# Patient Record
Sex: Female | Born: 1980 | Race: Black or African American | Hispanic: No | Marital: Married | Smoking: Never smoker
Health system: Southern US, Community
[De-identification: ages and names within clinical notes are randomized; demographics above are authoritative.]

## PROBLEM LIST (undated history)

## (undated) DIAGNOSIS — I341 Nonrheumatic mitral (valve) prolapse: Secondary | ICD-10-CM

## (undated) DIAGNOSIS — N921 Excessive and frequent menstruation with irregular cycle: Secondary | ICD-10-CM

## (undated) DIAGNOSIS — D649 Anemia, unspecified: Secondary | ICD-10-CM

## (undated) DIAGNOSIS — N946 Dysmenorrhea, unspecified: Secondary | ICD-10-CM

## (undated) DIAGNOSIS — I10 Essential (primary) hypertension: Secondary | ICD-10-CM

## (undated) DIAGNOSIS — K219 Gastro-esophageal reflux disease without esophagitis: Secondary | ICD-10-CM

## (undated) DIAGNOSIS — M1990 Unspecified osteoarthritis, unspecified site: Secondary | ICD-10-CM

## (undated) DIAGNOSIS — R51 Headache: Secondary | ICD-10-CM

## (undated) DIAGNOSIS — E119 Type 2 diabetes mellitus without complications: Secondary | ICD-10-CM

## (undated) DIAGNOSIS — R519 Headache, unspecified: Secondary | ICD-10-CM

## (undated) HISTORY — DX: Type 2 diabetes mellitus without complications: E11.9

## (undated) HISTORY — DX: Headache: R51

## (undated) HISTORY — DX: Nonrheumatic mitral (valve) prolapse: I34.1

## (undated) HISTORY — DX: Headache, unspecified: R51.9

## (undated) HISTORY — DX: Essential (primary) hypertension: I10

## (undated) HISTORY — PX: TUBAL LIGATION: SHX77

---

## 2000-08-19 ENCOUNTER — Other Ambulatory Visit: Admission: RE | Admit: 2000-08-19 | Discharge: 2000-08-19 | Payer: Self-pay | Admitting: Obstetrics & Gynecology

## 2001-05-10 ENCOUNTER — Other Ambulatory Visit: Admission: RE | Admit: 2001-05-10 | Discharge: 2001-05-10 | Payer: Self-pay | Admitting: Obstetrics and Gynecology

## 2001-07-05 ENCOUNTER — Ambulatory Visit (HOSPITAL_COMMUNITY): Admission: RE | Admit: 2001-07-05 | Discharge: 2001-07-05 | Payer: Self-pay | Admitting: Obstetrics and Gynecology

## 2001-07-05 ENCOUNTER — Encounter: Payer: Self-pay | Admitting: Obstetrics and Gynecology

## 2001-10-17 ENCOUNTER — Inpatient Hospital Stay (HOSPITAL_COMMUNITY): Admission: AD | Admit: 2001-10-17 | Discharge: 2001-10-20 | Payer: Self-pay | Admitting: Obstetrics and Gynecology

## 2002-01-04 ENCOUNTER — Encounter: Payer: Self-pay | Admitting: Emergency Medicine

## 2002-01-04 ENCOUNTER — Emergency Department (HOSPITAL_COMMUNITY): Admission: EM | Admit: 2002-01-04 | Discharge: 2002-01-04 | Payer: Self-pay | Admitting: Emergency Medicine

## 2002-10-20 ENCOUNTER — Other Ambulatory Visit: Admission: RE | Admit: 2002-10-20 | Discharge: 2002-10-20 | Payer: Self-pay | Admitting: Obstetrics and Gynecology

## 2003-03-26 ENCOUNTER — Inpatient Hospital Stay (HOSPITAL_COMMUNITY): Admission: AD | Admit: 2003-03-26 | Discharge: 2003-03-29 | Payer: Self-pay | Admitting: Obstetrics and Gynecology

## 2003-03-26 ENCOUNTER — Encounter (INDEPENDENT_AMBULATORY_CARE_PROVIDER_SITE_OTHER): Payer: Self-pay | Admitting: Specialist

## 2003-08-07 ENCOUNTER — Emergency Department (HOSPITAL_COMMUNITY): Admission: AD | Admit: 2003-08-07 | Discharge: 2003-08-07 | Payer: Self-pay | Admitting: Family Medicine

## 2004-09-06 ENCOUNTER — Emergency Department (HOSPITAL_COMMUNITY): Admission: EM | Admit: 2004-09-06 | Discharge: 2004-09-06 | Payer: Self-pay | Admitting: Family Medicine

## 2004-09-08 ENCOUNTER — Emergency Department (HOSPITAL_COMMUNITY): Admission: EM | Admit: 2004-09-08 | Discharge: 2004-09-08 | Payer: Self-pay | Admitting: Family Medicine

## 2004-10-15 ENCOUNTER — Other Ambulatory Visit: Admission: RE | Admit: 2004-10-15 | Discharge: 2004-10-15 | Payer: Self-pay | Admitting: Obstetrics and Gynecology

## 2005-02-28 ENCOUNTER — Emergency Department (HOSPITAL_COMMUNITY): Admission: EM | Admit: 2005-02-28 | Discharge: 2005-02-28 | Payer: Self-pay | Admitting: Family Medicine

## 2006-02-21 IMAGING — CR DG WRIST COMPLETE 3+V*L*
4 series · 4 of 4 positions shown · non-contrast
Comparison: none

CLINICAL DATA: Left wrist pain following a fall today.

LEFT WRIST - 4 VIEW

[view not recorded (1 of 4)]
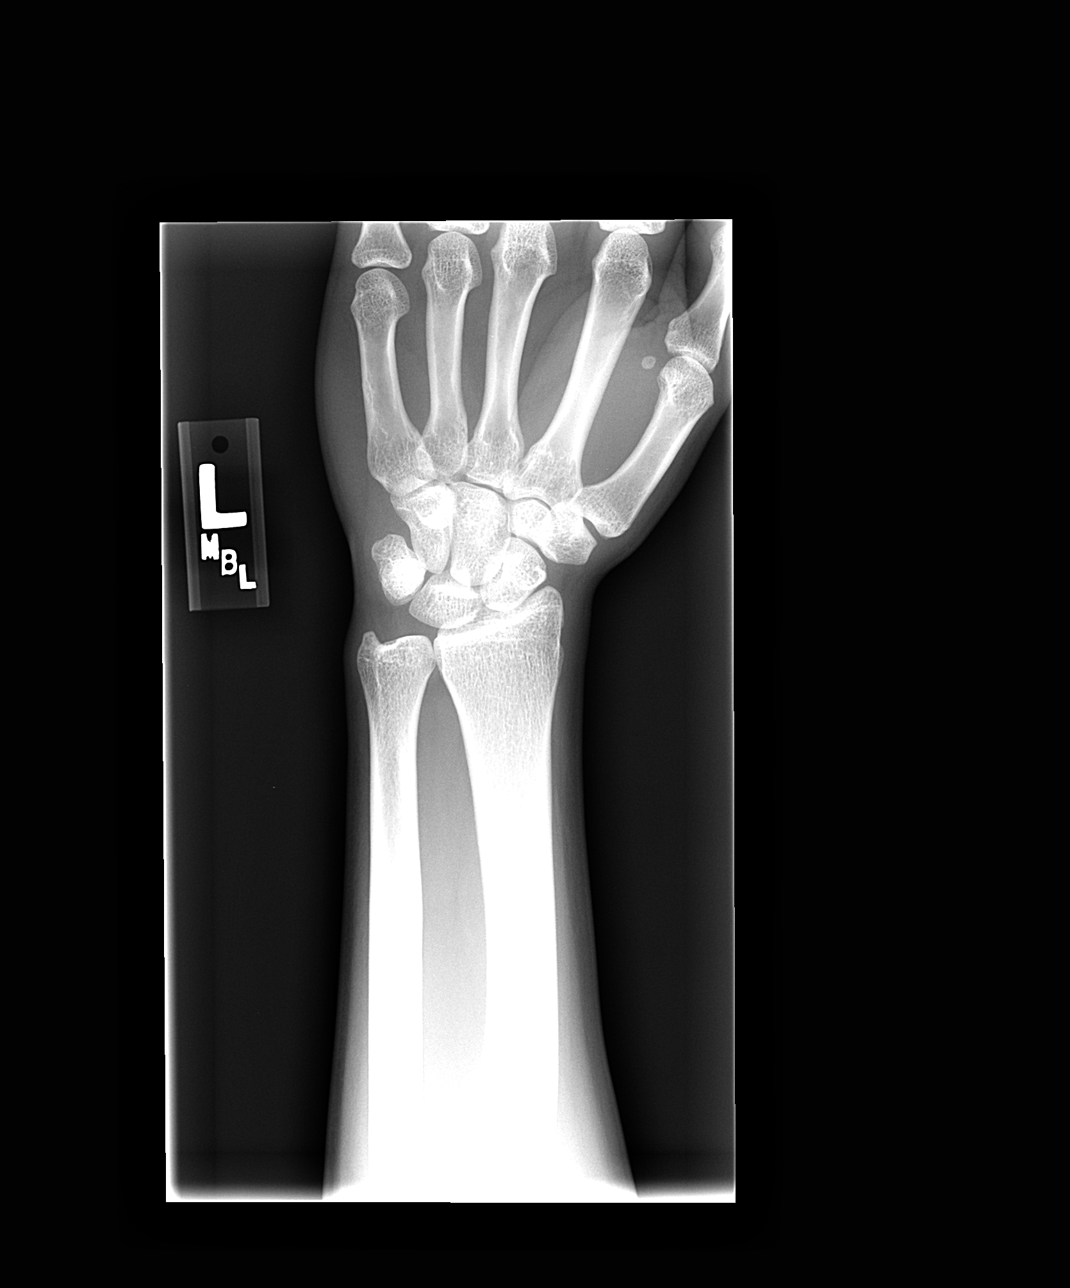

[view not recorded (2 of 4)]
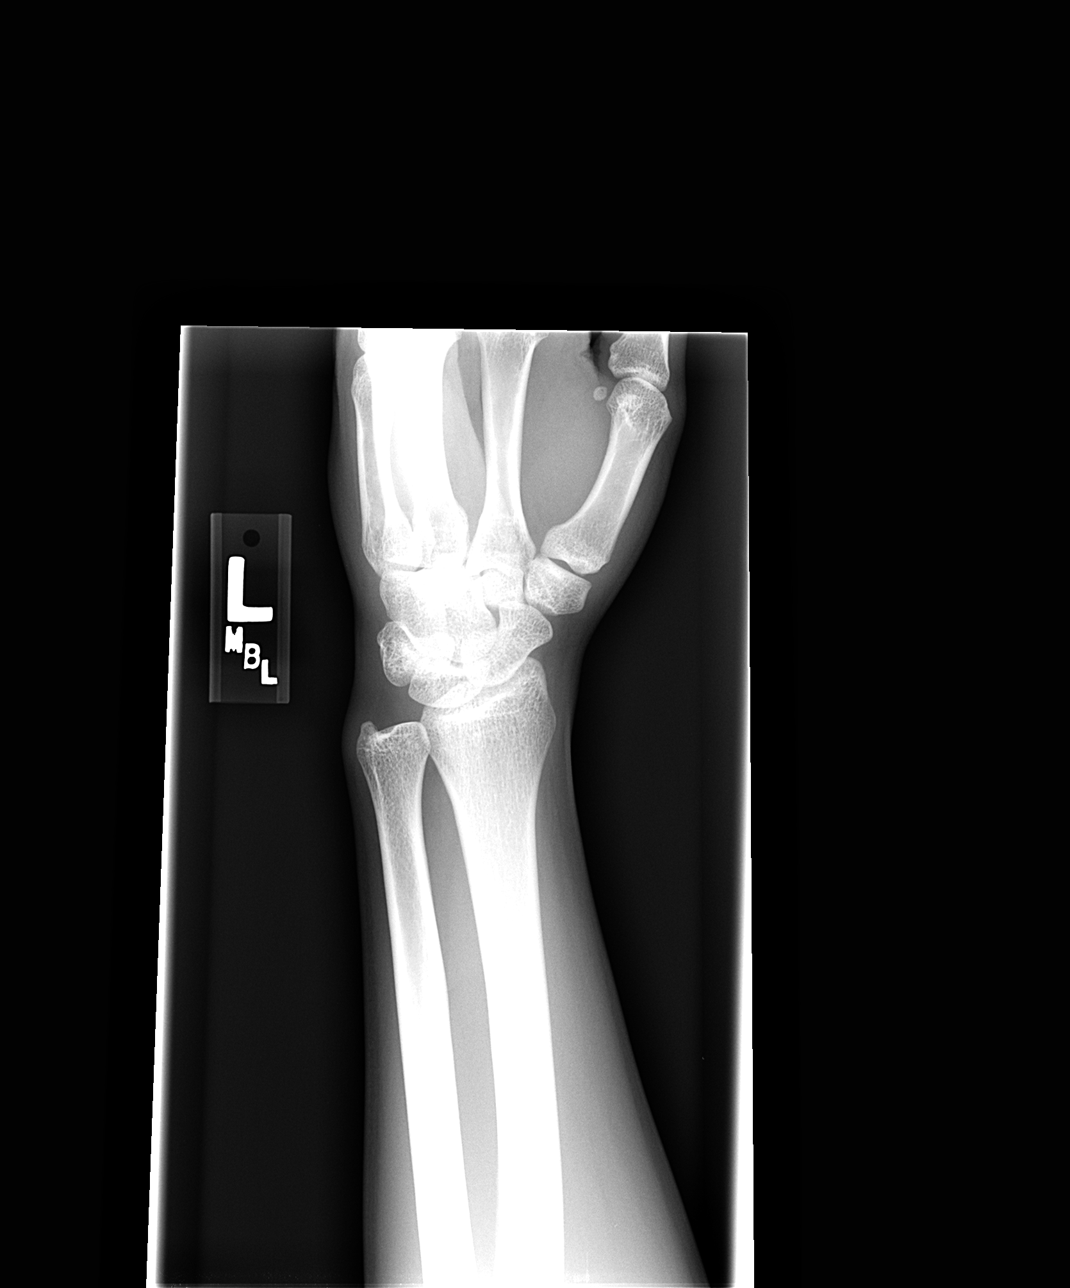

[view not recorded (3 of 4)]
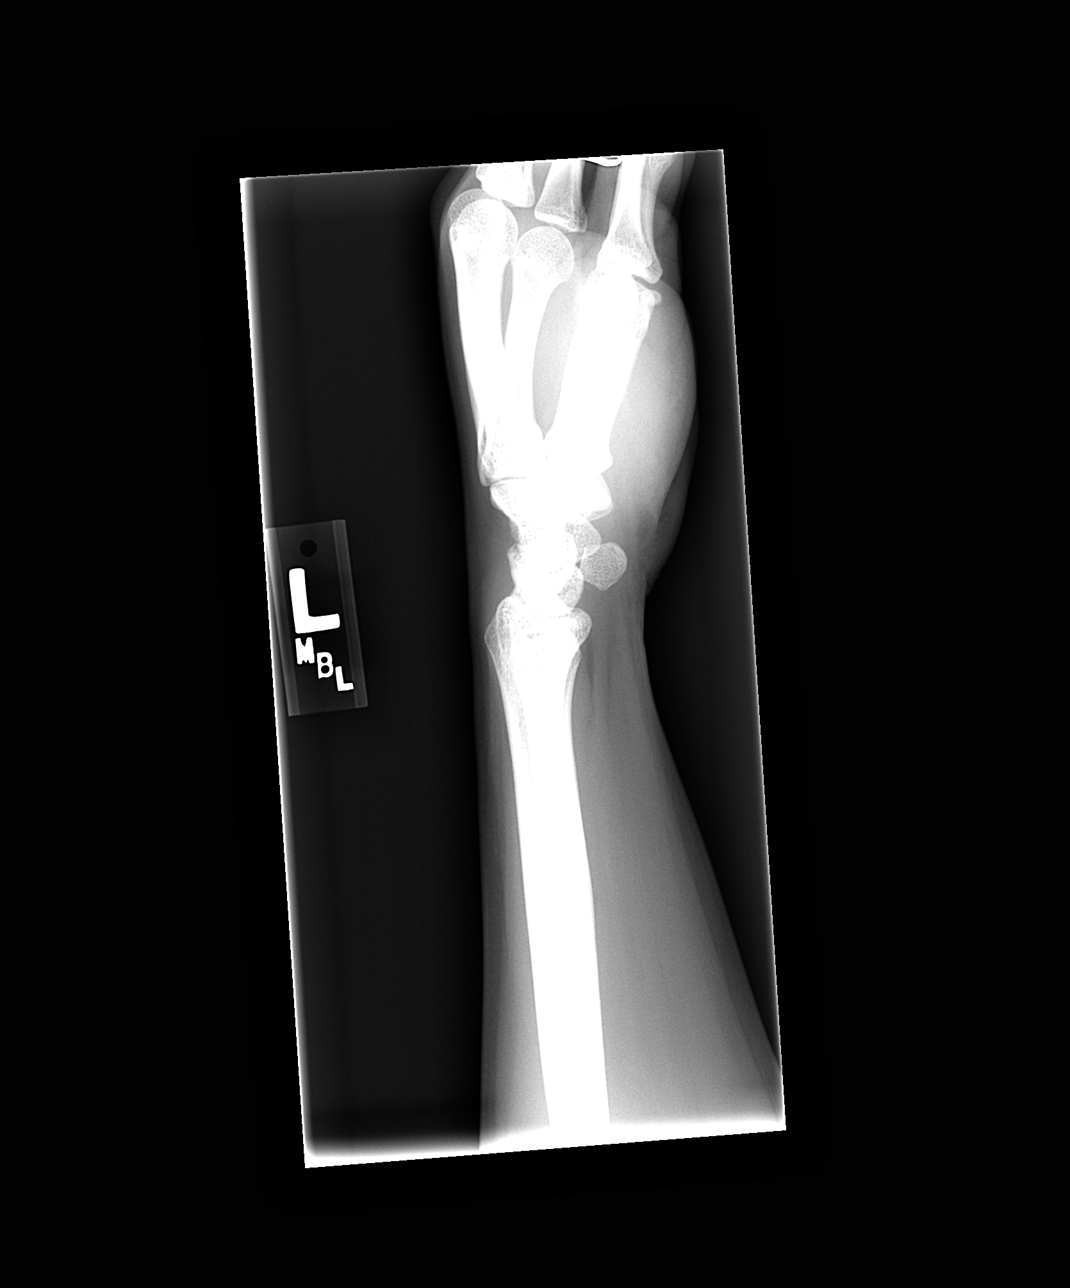

[view not recorded (4 of 4)]
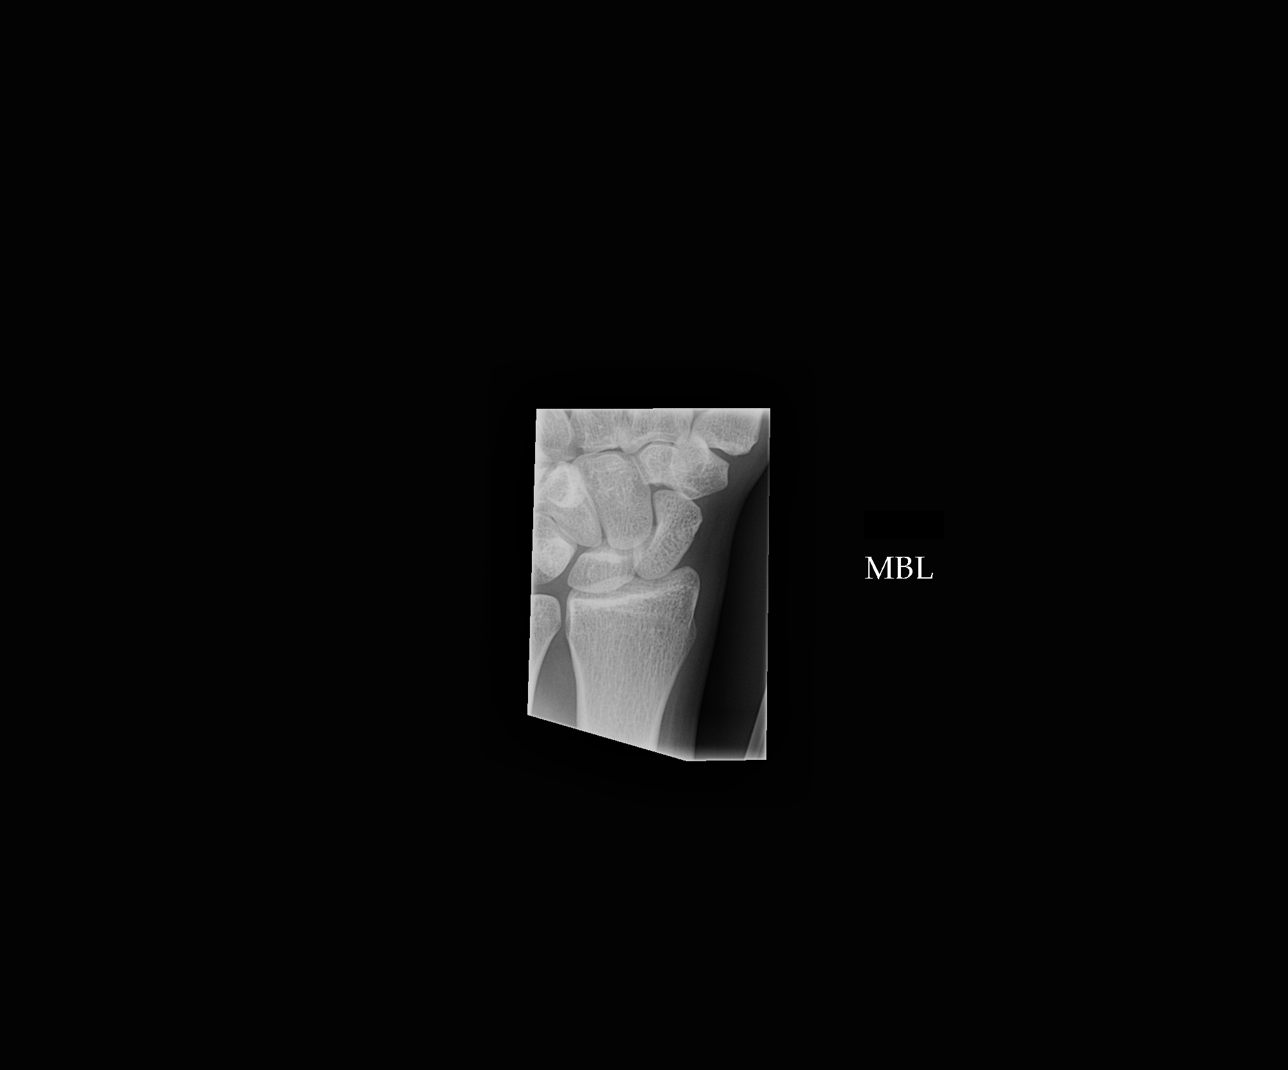

[4 of 4 positions shown; findings below may reference images not displayed]

FINDINGS: Normal appearing bones and soft tissues without fracture or
dislocation.

IMPRESSION

Normal examination.

## 2007-01-01 ENCOUNTER — Emergency Department (HOSPITAL_COMMUNITY): Admission: EM | Admit: 2007-01-01 | Discharge: 2007-01-01 | Payer: Self-pay | Admitting: Family Medicine

## 2007-05-05 ENCOUNTER — Emergency Department (HOSPITAL_COMMUNITY): Admission: EM | Admit: 2007-05-05 | Discharge: 2007-05-05 | Payer: Self-pay | Admitting: Family Medicine

## 2009-06-17 ENCOUNTER — Emergency Department (HOSPITAL_COMMUNITY): Admission: EM | Admit: 2009-06-17 | Discharge: 2009-06-17 | Payer: Self-pay | Admitting: Emergency Medicine

## 2009-11-02 ENCOUNTER — Emergency Department (HOSPITAL_COMMUNITY): Admission: EM | Admit: 2009-11-02 | Discharge: 2009-11-02 | Payer: Self-pay | Admitting: Family Medicine

## 2010-01-21 ENCOUNTER — Emergency Department (HOSPITAL_COMMUNITY): Admission: EM | Admit: 2010-01-21 | Discharge: 2010-01-21 | Payer: Self-pay | Admitting: Emergency Medicine

## 2010-03-28 ENCOUNTER — Emergency Department (HOSPITAL_COMMUNITY): Admission: EM | Admit: 2010-03-28 | Discharge: 2010-03-28 | Payer: Self-pay | Admitting: Family Medicine

## 2010-05-20 ENCOUNTER — Emergency Department (HOSPITAL_COMMUNITY): Admission: EM | Admit: 2010-05-20 | Discharge: 2010-05-20 | Payer: Self-pay | Admitting: Family Medicine

## 2010-12-07 ENCOUNTER — Inpatient Hospital Stay (INDEPENDENT_AMBULATORY_CARE_PROVIDER_SITE_OTHER)
Admission: RE | Admit: 2010-12-07 | Discharge: 2010-12-07 | Disposition: A | Discharge status: Home / Self Care | Payer: Medicaid Other | Source: Ambulatory Visit | Attending: Emergency Medicine | Admitting: Emergency Medicine

## 2010-12-07 DIAGNOSIS — M549 Dorsalgia, unspecified: Secondary | ICD-10-CM

## 2010-12-07 DIAGNOSIS — IMO0001 Reserved for inherently not codable concepts without codable children: Secondary | ICD-10-CM

## 2010-12-13 NOTE — Op Note (Signed)
NAME:  Diane Wilson, Diane Wilson                          ACCOUNT NO.:  [id number]   MEDICAL RECORD NO.:  [id number]                   PATIENT TYPE:  INP   LOCATION:  9104                                 FACILITY:  [facility]   PHYSICIAN:  Naima A. Dillard, M.D.              DATE OF BIRTH:  1980/08/01   DATE OF PROCEDURE:  03/26/2003  DATE OF DISCHARGE:                                 OPERATIVE REPORT   PREOPERATIVE DIAGNOSES:  1. Pregnancy at 37 weeks with labor.  2. Desires repeat cesarean section and bilateral tubal ligation.   POSTOPERATIVE DIAGNOSES:  1. Pregnancy at 37 weeks with labor.  2. Desires repeat cesarean section and bilateral tubal ligation.   PROCEDURES:  1. Repeat cesarean section.  2. Bilateral tubal ligation.   ANESTHESIA:  Spinal.   ESTIMATED BLOOD LOSS:  800 mL.   FLUIDS REPLACED:  2800 mL crystalloid.   URINE OUTPUT:  150 mL clear urine.   COMPLICATIONS:  None.   SURGEON:  Naima A. Dillard, M.D.   ASSISTANT:  Rica Koyanagi, C.N.M.   FINDINGS:  A female infant in vertex presentation with Apgars of 8 and 9,  weight 7 pounds.  Clear fluid noted, no nuchal cord, no meconium.  A normal-  appearing uterus and normal-appearing tubes and ovaries bilaterally.   PROCEDURE IN DETAIL:  The patient was taken to the operating room and placed  in dorsal supine position, prepped and draped in a normal sterile fashion.  Before taking to the operating room, consent was obtained for cesarean  section and tubal ligation.  The patient understands the risks of cesarean  section to be but not limited to bleeding, infection, damage to internal  organs such as bowel and bladder.  The patient was offered VBAC and declined  during her prenatal care.  The patient also desired a tubal ligation.  All  birth control was reviewed.  She understands it is meant to be a permanent  procedure.  There is a failure rate of about one to 200 or one to 300 and  50% of those can be ectopic  pregnancies, so the patient should call in any  event that she thinks she should be pregnant.  After her spinal anesthesia  was found to be adequate, a Pfannenstiel skin incision was made along the  previous incision and carried down to the fascia using Bovie cautery.  The  fascia was incised bilaterally using Bovie cautery.  Kochers x2 were placed  in the superior aspect of the fascia, which was dissected off the rectus  muscles both bluntly and with Bovie cautery.  The inferior aspect of the  fascia was dissected out in a similar fashion.  The rectus muscles were  separated in the midline and the peritoneum was identified, tented up, and  entered sharply with Metzenbaum scissors.  The incision was extended  superiorly and inferiorly with good visualization of bowel  and bladder.  The  bladder blade was inserted.  The vesicouterine peritoneum was identified,  tented up, and entered sharply and made bilaterally.  The bladder blade was  reinserted.  A lower transverse uterine incision was made with a scalpel and  extended bilaterally using bandage scissors.  We had difficulty in  delivering the infant's head.  The vacuum was placed on the infant's head in  correct position and placed in the green zone.  The vacuum started to move  around the infant's head so it was difficult to pull.  We then placed it in  correct position again and suction to the green zone.  There was one pull,  no pop-offs.  The vacuum was removed once the head was delivered.  Mouth and  nares were bulb-suctioned.  No nuchal cord.  The body was delivered without  difficulty.  The cord was clamped and cut and handed to the waiting  pediatricians.  The placenta was manually delivered intact.  The uterus was  cleared of all clot and debris.  The uterine incision was repaired with 0  Vicryl with a running locked fashion.  Any bleeding areas were made  hemostatic with figure-of-eight stitches of 0 Vicryl.  The patient's right   fallopian tube was identified with Babcock clamped, the fimbriated end was  seen.  About a centimeter of the midisthmic portion of the tube was suture  ligated and ligated also with a free tie of 2-0 plain and excised.  Hemostasis was assured.  The patient's left fallopian tube was identified.  The fimbriae were seen.  About a 1.5-2 cm segment of tube was ligated with 2-  0 plain and excised.  Hemostasis was assured.  The patient was closed with 0  chromic after irrigation and hemostasis was assured.  The fascia was closed  with 0 Vicryl in a running locked fashion.  The subcutaneous tissue was made  hemostatic with Bovie cautery after irrigation of the tissue was done.  Sponge, lap, and needle counts were correct x2.  The patient went to the  recovery room in stable condition.                                               Naima A. Normand Sloop, M.D.    NAD/MEDQ  D:  03/26/2003  T:  03/27/2003  Job:  [id number]

## 2010-12-13 NOTE — H&P (Signed)
[facility]  Patient:    Diane Wilson, Diane Wilson Visit Number: [id number] MRN: [id number]          Service Type: Attending:  Maris Berger. Pennie Rushing, M.D. Dictated by:   Mack Guise, C.N.M. Adm. Date:  10/17/01                           History and Physical  HISTORY OF PRESENT ILLNESS:   Diane Wilson is a 30 year old gravida 1 para 0 at term, EDD October 17, 2001 by dates confirmed with pregnancy ultrasonography. She presents to [facility] with increasing signs and symptoms of labor. She is contracting every two to three minutes, does report positive fetal movement, no bleeding, no rupture of membranes.  Denies any headache, visual changes, or epigastric pain.  Her pregnancy has been followed by the M.D. service at [facility] and is remarkable for: 1. Late to care. 2. Equivocal rubella. 3. Negative group B strep.  Her pregnancy was initially evaluated at the office of [facility] on May 10, 2002 at approximately [redacted] weeks gestation.  EDC determined by dates and confirmed with ultrasound.  Prenatal laboratory data reveals hemoglobin and hematocrit 10.7 and 31.9; platelets 266,000.  Blood type and Rh A positive, antibody screen negative. Sickle cell trait negative.  VDRL nonreactive.  Rubella equivocal.  Hepatitis B surface antigen negative.  HIV nonreactive.  Pap smear within normal limits. GC and chlamydia negative.  AFP/free beta HCG within normal range.  One-hour glucose challenge elevated; follow-up three-hour GTT within normal limits.  At 36 weeks culture of the vaginal tract is negative for group B strep.  This patients pregnancy has been essentially unremarkable.  She has been size equal to dates throughout, normotensive, with no proteinuria.  ALLERGIES:                    No known drug allergies.  HABITS:                       Denies the use of tobacco, alcohol, or illicit drugs.  OBSTETRICAL HISTORY:          Present pregnancy.  MEDICAL HISTORY:               Unremarkable.  FAMILY HISTORY:               Maternal aunt with a history of chronic hypertension and two maternal aunts with diabetes.  GENETIC HISTORY:              There is no family history of familial or genetic disorders, children that died in infancy or that were born with birth defects.  SOCIAL HISTORY:               Ms. Dabbs is a 30 year old single African-American.  The father of the baby is Mee Hives.  They do not subscribe to a religious faith.  REVIEW OF SYSTEMS:            There are no signs or symptoms suggestive of focal or systemic disease and the patient is typical of one with a uterine pregnancy at term in early active labor.  PHYSICAL EXAMINATION:  VITAL SIGNS:                  Stable, afebrile.  HEENT:                        Unremarkable.  HEART:                        Regular rate and rhythm.  LUNGS:                        Clear.  ABDOMEN:                      Gravid in its contour.  Uterine fundus is noted to extend 39 cm above the level of the pubic symphysis.  Leopolds maneuvers find the infant to be in a longitudinal lie, cephalic presentation, and the estimated fetal weight is 7.5 pounds.  PELVIC:                       Digital exam of the cervix finds it to be 4 cm dilated, 70% effaced, with the cephalic presenting part at a -2 station. Membranes ruptured on exam, clear fluid.  Patient is contracting every two to three minutes.  The fetal heart rate is reactive and reassuring.  EXTREMITIES:                  Show no pathologic edema.  DTRs are 1+ with no clonus.  ASSESSMENT:                   1. Intrauterine pregnancy at term.                               2. Early active labor.  PLAN:                         1. Admit per Dr. Dierdre Forth.                               2. Routine M.D. orders. Dictated by:   Mack Guise, C.N.M. Attending:  Maris Berger. Pennie Rushing, M.D. DD:  10/17/01 TD:  10/18/01 Job: [id number]

## 2010-12-13 NOTE — Discharge Summary (Signed)
[facility]  Patient:    Diane Wilson, Diane Wilson Visit Number: [id number] MRN: [id number]          Service Type: OBS Location: 910A 9148 01 Attending Physician:  Shaune Spittle Dictated by:   Mack Guise, C.N.M. Admit Date:  10/17/2001 Discharge Date: 10/20/2001                             Discharge Summary  ADMITTING DIAGNOSES:          1. Intrauterine pregnancy at term.                               2. Active labor.  PROCEDURE:                    Primary low transverse cesarean section.  DISCHARGE DIAGNOSIS:          1. Intrauterine pregnancy at term.                               2. Active labor.                               3. Failure to progress.                               4. Status post primary low transverse cesarean                                  section.                               5. Maternal fever.  HOSPITAL COURSE:              Ms. Hartfield is a 30 year old gravida 1, para 0, who presented at term in early active labor.  Her labor progressed normally to 8-9 cm.  She then experienced failure to progress and a maternal temperature of 100.5.  The decision was made to proceed with primary low transverse cesarean section, which was accomplished by Erie Noe P. Haygood, M.D., with the birth of an 8 pound 5 ounce female infant named Carlos with Apgar scores of 9 at one minute and 9 at five minutes.  The patient has done well in the immediate postop period.  Her hemoglobin on the first postop day was 9.7.  Her vital signs have remained stable.  She has remained afebrile and done quite well on this, her third postop day.  She is judged to be in satisfactory condition for discharge.  DISCHARGE INSTRUCTIONS:       Per [facility] handout.  DISCHARGE MEDICATIONS:        1. Motrin 600 mg p.o. q.6h. p.r.n. pain.                               2. Tylox one to two p.o. q.3-4h. p.r.n. pain.  3.  Ortho-Evra patch for contraception.                               4. Prenatal vitamins.  DISCHARGE FOLLOW-UP:          At [facility] in six weeks. Dictated by:   Mack Guise, C.N.M. Attending Physician:  Shaune Spittle DD:  10/20/01 TD:  10/21/01 Job: [id number]

## 2010-12-13 NOTE — H&P (Signed)
NAME:  Diane, Wilson                          ACCOUNT NO.:  [id number]   MEDICAL RECORD NO.:  [id number]                   PATIENT TYPE:  INP   LOCATION:  9198                                 FACILITY:  [facility]   PHYSICIAN:  Naima A. Dillard, M.D.              DATE OF BIRTH:  12-23-1980   DATE OF ADMISSION:  03/26/2003  DATE OF DISCHARGE:                                HISTORY & PHYSICAL   CHIEF COMPLAINT:  Term and latent labor.  Desires repeat cesarean section  and tubal ligation.   HISTORY OF PRESENT ILLNESS:  The patient is a 30 year old, gravida 2, para 1-  0-0-1, with a last menstrual period of July 08, 2002.  The estimated  date of confinement is April 15, 2003, consistent with second trimester  ultrasound.  The estimated gestational age is 37-1/7 weeks.  The patient  presents with complaint of contractions.  No leakage of fluid.  No vaginal  bleeding.  Good fetal movement.  She states that her contractions are every  five to six minutes apart, increasing in intensity and frequency.  The  patient ambulated for one hour after NST was found to be reactive and did  change her cervix from 1 cm dilation to 3 cm dilation.  The patient is  currently consented for repeat cesarean section and tubal ligation.  Prenatal care was with [facility] since approximately [redacted] weeks  gestation.  The pregnancy has been complicated by history of C-section.  The  patient was given information on C-section versus VBAC and has decided for C-  section and tubal ligation.  She also had Trichomonas on Pap smear which was  treated with Flagyl.   PRENATAL LABORATORY DATA:  Hemoglobin 11.  The first platelets were 275.  She is A positive, antibody negative, and sickle cell negative.  RPR was  nonreactive.  Rubella is positive.  Hepatitis B surface antigen was  negative.  HIV is nonreactive.  Pap was within normal limits with  Trichomonas which was treated.  Gonorrhea was negative.  Chlamydia  was  negative.  Cystic fibrosis was negative.  Quad screen was within normal  limits.  Group B Streptococcus was also negative.   PAST OBSTETRICAL HISTORY:  Significant for a full-term primary C-section in  March of 2003.  The baby weighed 8 pounds 5 ounces.  She had a C-section for  failure to progress and chorioamnionitis.  The patient had menarche at age  12, occurring every 28 days, and lasting for six days.  No history of any  sexually transmitted diseases.  No history of abnormal Pap smear.   PAST MEDICAL HISTORY:  Unremarkable.   PAST SURGICAL HISTORY:  Significant for C-section.   SOCIAL HISTORY:  The patient denies any alcohol, drug, or substance abuse.   ALLERGIES:  The patient has no known drug allergies.   FAMILY HISTORY:  Significant for a maternal aunt with chronic  hypertension  and maternal aunt with diabetes.   GENETIC HISTORY:  Unremarkable.   PHYSICAL EXAMINATION:  VITAL SIGNS:  The patient is afebrile.  Vital signs  are stable.  NST is reactive.  The patient is contracting about every three  to five minutes.  HEART:  Regular rate and rhythm.  LUNGS:  Clear to auscultation bilaterally.  ABDOMEN:  Gravid, soft, and nontender.  PELVIC:  The cervix is 3, 50, and -3 per Rica Koyanagi, C.N.M.  EXTREMITIES:  No cyanosis, clubbing, or edema.   ASSESSMENT:  Pregnancy at term in labor.  Desires repeat cesarean section  and tubal ligation.   PLAN:  Will proceed.  The risks of C-section and tubal ligation are, but not  limited to bleeding, infection, damage to internal organs, such as bowel and  bladder, and failure rate about 1:200-1:300 for tubal ligation, 50%  resulting in ectopic pregnancy.  The patient understands agrees with plan.                                               Naima A. Normand Sloop, M.D.    NAD/MEDQ  D:  03/26/2003  T:  03/26/2003  Job:  [id number]

## 2010-12-13 NOTE — Discharge Summary (Signed)
   Diane Wilson, Diane Wilson                          ACCOUNT NO.:  [id number]   MEDICAL RECORD NO.:  [id number]                   PATIENT TYPE:  INP   LOCATION:  9104                                 FACILITY:  [facility]   PHYSICIAN:  Janine Limbo, M.D.            DATE OF BIRTH:  12-19-1980   DATE OF ADMISSION:  03/26/2003  DATE OF DISCHARGE:  03/29/2003                                 DISCHARGE SUMMARY   ADMITTING DIAGNOSES:  1. Intrauterine pregnancy at term.  2. Early active labor.  3. Desires repeat cesarean section and tubal ligation.   PROCEDURE:  1. Repeat low transverse cesarean section.  2. Bilateral tubal sterilization.   DISCHARGE DIAGNOSES:  1. Intrauterine pregnancy at term.  2. Early active labor.  3. Desires repeat cesarean section and tubal ligation.  4. Repeat low transverse cesarean section.  5. Bilateral tubal sterilization.   HISTORY:  Diane Wilson is a 30 year old gravida 2, para 1-0-0-1 who presented  at 45 and 1/[redacted] weeks gestation in early active labor.  She had planned a  repeat cesarean delivery with this pregnancy and therefore she underwent  repeat cesarean section with bilateral tubal sterilization by Naima A.  Dillard, M.D. with the birth of a 7 pound female infant named Jaden with Apgar  scores of 8 at one minute, 9 at five minutes.  Both mother and infant have  been stable in the postpartum period.  The patient's vital signs have  remained stable throughout.  Her hemoglobin on the first postoperative day  was 8.1 and initially was 9.8.  She has not had any signs or symptoms of  dizziness or syncope.  She will take prenatal vitamins and iron supplement  on discharge.  On this, her third postoperative day she is judged to be in  satisfactory condition for discharge.   DISCHARGE INSTRUCTIONS:  Per [facility] handout.  Her incision is  clean and intact.  Her staples will be removed prior to discharge.   DISCHARGE MEDICATIONS:  1. Motrin 600 mg  p.o. q.6h. p.r.n. pain.  2. Tylox one to two p.o. q.3-4h. pain.  3. Prenatal vitamins.  4. Iron supplement b.i.d.   FOLLOWUP:  The patient will follow up in the office of [facility] in six weeks.    Rica Koyanagi, C.N.M.               Janine Limbo, M.D.   SDM/MEDQ  D:  03/29/2003  T:  03/29/2003  Job:  [id number]

## 2010-12-13 NOTE — Op Note (Signed)
[facility]  Patient:    Diane Wilson, FRIBERG Visit Number: [id number] MRN: [id number]          Service Type: OBS Location: 910A 9148 01 Attending Physician:  Shaune Spittle Dictated by:   Maris Berger. Pennie Rushing, M.D. Proc. Date: 10/17/01 Admit Date:  10/17/2001                             Operative Report  PREOPERATIVE DIAGNOSES:       1. Intrauterine pregnancy at term.                               2. Failure to progress in labor.                               3. Maternal fever.  POSTOPERATIVE DIAGNOSES:      1. Intrauterine pregnancy at term.                               2. Failure to progress in labor.                               3. Maternal fever.  OPERATION:                    Primary low transverse cesarean section.  SURGEON:                      Vanessa P. Pennie Rushing, M.D.  FIRST ASSISTANT:              Mack Guise, C.N.M.  ANESTHESIA:                   Epidural.  ESTIMATED BLOOD LOSS:         Less than 750 cc.  COMPLICATIONS:                None.  FINDINGS:                     The patient delivered a female infant whose name is Mikle Bosworth, weighing 8 lb 5 oz, with Apgars of 9 and 9 at one and five minutes respectively.  The uterus, tubes and ovaries were normal for the gravid state.   DESCRIPTION OF PROCEDURE:     The patient was taken to the operating room after appropriate identification and after a discussion of the indications for her procedure and the risks involved, which include (but are not limited to) anesthesia, bleeding, infection, and damage to adjacent organs.  She wished to proceed and was taken to the operating room and placed on the operating table, where her labor epidural was topped.  She had a Foley catheter from labor in place.  The abdomen was prepped with multiple layers of Betadine and draped as a sterile field.  After assurance of adequate anesthesia, a transverse incision was made in the abdomen and  the abdomen opened in layers.  The per9itoneum was entered.  A bladder blade was placed.  The uterus was incised approximately 2 cm above the uterovesical fold and the infant delivered with the aid of a Kiwi vacuum extractor from the occiput transverse position.  The nares  and pharynx were suctioned and the cord clamped and cut.  The infant was handed off to the awaiting pediatricians.  The appropriate cord blood was drawn, the placenta noted to have separated from the uterus and was removed from the operating field.  The uterine incision was closed with a running interlocking suture of 0 Vicryl.  An imbricating suture of 0 Vicryl was then placed.  Hemostasis was noted to be adequate and copious irrigation was carried out.  The abdominal peritoneum was closed with a running suture of 2-0 Vicryl.  The rectus muscles were reapproximated in the midline with a figure-of-eight suture of 2-0 Vicryl.  The rectus fascia was closed wit a running suture of 0 Vicryl, then reinforced on either side of midline with figure-of-eight sutures of 0 Vicryl.  The subcutaneous tissue was made hemostatic with Bovie cautery and irrigated.  Skin staples were applied to the skin incision and a sterile dressing was applied.  Hemostasis throughout the procedure was noted to be adequate.  After application of a sterile dressing, the patient was taken from the operating room to the recovery room in satisfactory condition, having tolerated the procedure well with sponge and instrument counts correct.  The infant went to the full term nursery. Dictated by:   Maris Berger. Pennie Rushing, M.D. Attending Physician:  Shaune Spittle DD:  10/17/01 TD:  10/18/01 Job: [id number]

## 2011-02-22 ENCOUNTER — Inpatient Hospital Stay (INDEPENDENT_AMBULATORY_CARE_PROVIDER_SITE_OTHER)
Admission: RE | Admit: 2011-02-22 | Discharge: 2011-02-22 | Disposition: A | Discharge status: Home / Self Care | Payer: Medicaid Other | Source: Ambulatory Visit | Attending: Family Medicine | Admitting: Family Medicine

## 2011-02-22 DIAGNOSIS — R51 Headache: Secondary | ICD-10-CM

## 2011-02-22 DIAGNOSIS — J019 Acute sinusitis, unspecified: Secondary | ICD-10-CM

## 2011-11-03 ENCOUNTER — Emergency Department (INDEPENDENT_AMBULATORY_CARE_PROVIDER_SITE_OTHER)
Admission: EM | Admit: 2011-11-03 | Discharge: 2011-11-03 | Disposition: A | Discharge status: Home / Self Care | Payer: BC Managed Care – PPO | Source: Home / Self Care | Attending: Family Medicine | Admitting: Family Medicine

## 2011-11-03 ENCOUNTER — Encounter (HOSPITAL_COMMUNITY): Payer: Self-pay

## 2011-11-03 DIAGNOSIS — J329 Chronic sinusitis, unspecified: Secondary | ICD-10-CM

## 2011-11-03 DIAGNOSIS — N390 Urinary tract infection, site not specified: Secondary | ICD-10-CM

## 2011-11-03 LAB — POCT PREGNANCY, URINE: Preg Test, Ur: NEGATIVE

## 2011-11-03 MED ORDER — FLUCONAZOLE 150 MG PO TABS: 150.0000 mg | ORAL_TABLET | Freq: Once | ORAL | Status: AC

## 2011-11-03 MED ORDER — SULFAMETHOXAZOLE-TRIMETHOPRIM 800-160 MG PO TABS: 1.0000 | ORAL_TABLET | Freq: Two times a day (BID) | ORAL | Status: AC

## 2011-11-03 NOTE — Discharge Instructions (Signed)
Take antibiotics as directed. Increase hydration. Return to care should your symptoms not improve, or worsen in any way.

## 2011-11-03 NOTE — ED Provider Notes (Signed)
History     CSN: [id number]  Arrival date & time 11/03/11  Rickey Primus   First MD Initiated Contact with Patient 11/03/11 1823      Chief Complaint  Patient presents with  . Urinary Tract Infection  . Facial Pain    (Consider location/radiation/quality/duration/timing/severity/associated sxs/prior treatment) HPI Comments: Meggen presents for evaluation of pain behind her left eye, headaches, purulent nasal drainage, and rhinorrhea over the last few weeks. She also reports symptoms consistent with urinary tract infection whereby she reports terminal dysuria, urinary urgency, and small amounts of urine. She denies any new sexual partners or exposures.  Patient is a 31 y.o. female presenting with dysuria. The history is provided by the patient.  Dysuria  This is a new problem. The current episode started more than 1 week ago. The problem occurs every urination. The problem has not changed since onset.The quality of the pain is described as burning. There has been no fever. Associated symptoms include frequency and urgency. Pertinent negatives include no chills, no sweats, no nausea, no vomiting, no discharge and no flank pain. She has tried nothing for the symptoms.    History reviewed. No pertinent past medical history.  History reviewed. No pertinent past surgical history.  No family history on file.  History  Substance Use Topics  . Smoking status: Never Smoker   . Smokeless tobacco: Not on file  . Alcohol Use: No    OB History    Grav Para Term Preterm Abortions TAB SAB Ect Mult Living                  Review of Systems  Constitutional: Negative.  Negative for chills.  HENT: Negative.   Eyes: Negative.   Respiratory: Negative.   Cardiovascular: Negative.   Gastrointestinal: Negative.  Negative for nausea and vomiting.  Genitourinary: Positive for dysuria, urgency and frequency. Negative for flank pain.  Musculoskeletal: Negative.   Skin: Negative.   Neurological: Negative.      Allergies  Review of patient's allergies indicates no known allergies.  Home Medications   Current Outpatient Rx  Name Route Sig Dispense Refill  . FLUCONAZOLE 150 MG PO TABS Oral Take 1 tablet (150 mg total) by mouth once. Take one pill once. May repeat if symptoms persist after 3rd day. 2 tablet 0  . SULFAMETHOXAZOLE-TRIMETHOPRIM 800-160 MG PO TABS Oral Take 1 tablet by mouth 2 (two) times daily. 14 tablet 0    BP 141/74  Pulse 75  Temp(Src) 99.5 F (37.5 C) (Oral)  Resp 16  SpO2 100%  LMP 10/23/2011  Physical Exam  Nursing note and vitals reviewed. Constitutional: She is oriented to person, place, and time. She appears well-developed and well-nourished.  HENT:  Head: Normocephalic and atraumatic.  Right Ear: Tympanic membrane is retracted.  Left Ear: Tympanic membrane is retracted.  Mouth/Throat: Uvula is midline, oropharynx is clear and moist and mucous membranes are normal.  Eyes: EOM are normal.  Neck: Normal range of motion.  Cardiovascular: Normal rate, regular rhythm, S1 normal, S2 normal and normal heart sounds.   No murmur heard. Pulmonary/Chest: Effort normal and breath sounds normal. She has no decreased breath sounds. She has no wheezes. She has no rhonchi.  Musculoskeletal: Normal range of motion.  Neurological: She is alert and oriented to person, place, and time.  Skin: Skin is warm and dry.  Psychiatric: Her behavior is normal.    ED Course  Procedures (including critical care time)   Labs Reviewed  POCT PREGNANCY, URINE  No results found.   1. UTI (lower urinary tract infection)   2. Rhinosinusitis       MDM  rx given for TMP-SMX, fluconazole; labs reviewed        Renaee Munda, MD 11/03/11 2243

## 2011-11-03 NOTE — ED Notes (Signed)
C/o pain over lt eye and pressure, tan nasal secretions and runny nose for 2 weeks.  Also c/o urinary urgency, voiding small amounts and rt pelvic pressure for 1 week.

## 2011-12-17 ENCOUNTER — Encounter: Payer: Self-pay | Admitting: *Deleted

## 2011-12-17 ENCOUNTER — Encounter: Payer: Self-pay | Admitting: Cardiology

## 2011-12-18 ENCOUNTER — Encounter: Payer: Self-pay | Admitting: Cardiology

## 2011-12-18 ENCOUNTER — Ambulatory Visit (INDEPENDENT_AMBULATORY_CARE_PROVIDER_SITE_OTHER): Payer: BC Managed Care – PPO | Admitting: Cardiology

## 2011-12-18 ENCOUNTER — Encounter: Payer: Self-pay | Admitting: *Deleted

## 2011-12-18 VITALS — BP 118/74 | HR 75 | Ht 61.0 in | Wt 171.0 lb

## 2011-12-18 DIAGNOSIS — I341 Nonrheumatic mitral (valve) prolapse: Secondary | ICD-10-CM

## 2011-12-18 DIAGNOSIS — I059 Rheumatic mitral valve disease, unspecified: Secondary | ICD-10-CM

## 2011-12-18 DIAGNOSIS — R079 Chest pain, unspecified: Secondary | ICD-10-CM | POA: Insufficient documentation

## 2011-12-18 NOTE — Assessment & Plan Note (Signed)
Pain lasts one to 2 seconds and resolve spontaneously. She does not have exertional symptoms. We'll not pursue further cardiac evaluation.

## 2011-12-18 NOTE — Assessment & Plan Note (Signed)
Plan echocardiogram to further evaluate. I do not hear a mitral regurgitation murmur on examination.

## 2011-12-18 NOTE — Patient Instructions (Signed)
Your physician recommends that you schedule a follow-up appointment in: AS NEEDED PENDING TEST RESULTS  Your physician has requested that you have an echocardiogram. Echocardiography is a painless test that uses sound waves to create images of your heart. It provides your doctor with information about the size and shape of your heart and how well your heart's chambers and valves are working. This procedure takes approximately one hour. There are no restrictions for this procedure.    

## 2011-12-18 NOTE — Progress Notes (Signed)
  HPI: pleasant 31 year old female for evaluation of chest pain and mitral valve prolapse. Patient carries the diagnosis of mitral valve prolapse from previous evaluation in [place]. She does not have dyspnea on exertion, orthopnea, PND, pedal edema, palpitations or history of syncope. She occasionally feels a brief pain in her chest that lasts 1-2 seconds. They resolve spontaneously. The pain is not exertional. No associated symptoms. Because of the above we were asked to evaluate.  No current outpatient prescriptions on file.    No Known Allergies  Past Medical History  Diagnosis Date  . Mitral valve prolapse     Past Surgical History  Procedure Date  . Tubal ligation   . Cesarean section     History   Social History  . Marital Status: Single    Spouse Name: N/A    Number of Children: 2  . 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 Smoking status: Never Smoker   . Smokeless tobacco: Not on file  . Alcohol Use: No  . Drug Use: No  . Sexually Active:    Other Topics Concern  . Not on file   Social History Narrative  . No narrative on file    Family History  Problem Relation Age of Onset  . Diabetes    . Hypertension      ROS: no fevers or chills, productive cough, hemoptysis, dysphasia, odynophagia, melena, hematochezia, dysuria, hematuria, rash, seizure activity, orthopnea, PND, pedal edema, claudication. Remaining systems are negative.  Physical Exam:   Blood pressure 118/74, pulse 75, height 5\' 1"  (1.549 m), weight 77.565 kg (171 lb).  General:  Well developed/well nourished in NAD Skin warm/dry Patient not depressed No peripheral clubbing Back-normal HEENT-normal/normal eyelids Neck supple/normal carotid upstroke bilaterally; no bruits; no JVD; no thyromegaly chest - CTA/ normal expansion CV - RRR/normal S1 and S2; no murmurs, rubs or gallops;  PMI nondisplaced Abdomen -NT/ND, no HSM, no mass,  + bowel sounds, no bruit 2+ femoral pulses, no bruits Ext-no edema, chords, 2+ DP Neuro-grossly nonfocal  ECG normal sinus rhythm at a rate of 75. Nonspecific ST changes.

## 2011-12-25 ENCOUNTER — Ambulatory Visit (HOSPITAL_COMMUNITY): Payer: BC Managed Care – PPO

## 2011-12-29 ENCOUNTER — Ambulatory Visit (HOSPITAL_COMMUNITY): Payer: BC Managed Care – PPO | Attending: Cardiology

## 2011-12-29 DIAGNOSIS — R072 Precordial pain: Secondary | ICD-10-CM | POA: Insufficient documentation

## 2011-12-29 DIAGNOSIS — R079 Chest pain, unspecified: Secondary | ICD-10-CM | POA: Insufficient documentation

## 2011-12-29 DIAGNOSIS — I341 Nonrheumatic mitral (valve) prolapse: Secondary | ICD-10-CM

## 2011-12-29 DIAGNOSIS — I059 Rheumatic mitral valve disease, unspecified: Secondary | ICD-10-CM | POA: Insufficient documentation

## 2011-12-29 NOTE — Progress Notes (Signed)
Echocardiogram performed.  

## 2012-01-01 ENCOUNTER — Telehealth: Payer: Self-pay | Admitting: Cardiology

## 2012-01-01 NOTE — Telephone Encounter (Signed)
Follow-up:    Patient called in returning Christine's call about her ECHO results.  Please call back.

## 2012-01-01 NOTE — Telephone Encounter (Signed)
Patient aware of echo results.

## 2013-02-09 ENCOUNTER — Emergency Department (HOSPITAL_COMMUNITY)
Admission: EM | Admit: 2013-02-09 | Discharge: 2013-02-09 | Disposition: A | Discharge status: Home / Self Care | Payer: BC Managed Care – PPO | Source: Home / Self Care | Attending: Emergency Medicine | Admitting: Emergency Medicine

## 2013-02-09 ENCOUNTER — Encounter (HOSPITAL_COMMUNITY): Payer: Self-pay

## 2013-02-09 DIAGNOSIS — S058X9A Other injuries of unspecified eye and orbit, initial encounter: Secondary | ICD-10-CM

## 2013-02-09 DIAGNOSIS — S0501XA Injury of conjunctiva and corneal abrasion without foreign body, right eye, initial encounter: Secondary | ICD-10-CM

## 2013-02-09 MED ORDER — TETRACAINE HCL 0.5 % OP SOLN
2.0000 [drp] | Freq: Once | OPHTHALMIC | Status: AC
  Administered 2013-02-09: 2 [drp] via OPHTHALMIC

## 2013-02-09 MED ORDER — TETRACAINE HCL 0.5 % OP SOLN
OPHTHALMIC | Status: AC
  Filled 2013-02-09: qty 2

## 2013-02-09 MED ORDER — ERYTHROMYCIN 5 MG/GM OP OINT: TOPICAL_OINTMENT | OPHTHALMIC | Status: AC

## 2013-02-09 NOTE — ED Provider Notes (Signed)
History    CSN: [id number] Arrival date & time 02/09/13  1813  First MD Initiated Contact with Patient 02/09/13 1831     Chief Complaint  Patient presents with  . Eye Problem   (Consider location/radiation/quality/duration/timing/severity/associated sxs/prior Treatment) HPI Comments: Patient presents to urgent care with discomfort and tearing out of his right eye since Friday of last week. She was swimming in a pull and they were fully around by vision water and each other. One point she felt like something hit her eye. In the morning she's been experiencing when she wakes up crusted eyelids. Just some redness or conjunctival. Denies any visual changes, associated symptoms such as nausea, malaise, or headaches.  Tears frequently and with mild discomfort  Patient is a 32 y.o. female presenting with eye problem. The history is provided by the patient.  Eye Problem Location:  R eye Quality:  Aching Severity:  Mild Onset quality:  Sudden Timing:  Constant Progression:  Unchanged Chronicity:  New Relieved by:  Nothing Worsened by:  Nothing tried Associated symptoms: discharge, foreign body sensation, itching, redness and tearing   Associated symptoms: no blurred vision, no double vision, no facial rash, no inflammation, no nausea, no numbness, no photophobia, no vomiting and no weakness   Risk factors: no conjunctival hemorrhage, not exposed to pinkeye, no previous injury to eye, no recent herpes zoster and no recent URI    Past Medical History  Diagnosis Date  . Mitral valve prolapse    Past Surgical History  Procedure Laterality Date  . Tubal ligation    . Cesarean section     Family History  Problem Relation Age of Onset  . Diabetes    . Hypertension     History  Substance Use Topics  . Smoking status: Never Smoker   . Smokeless tobacco: Not on file  . Alcohol Use: No   OB History   Grav Para Term Preterm Abortions TAB SAB Ect Mult Living                 Review  of Systems  Constitutional: Negative for fever, chills, diaphoresis, activity change, fatigue and unexpected weight change.  HENT: Negative for ear pain, neck pain and neck stiffness.   Eyes: Positive for discharge, redness and itching. Negative for blurred vision, double vision, photophobia, pain and visual disturbance.  Respiratory: Negative for shortness of breath.   Gastrointestinal: Negative for nausea and vomiting.  Neurological: Negative for weakness and numbness.    Allergies  Review of patient's allergies indicates no known allergies.  Home Medications   Current Outpatient Rx  Name  Route  Sig  Dispense  Refill  . erythromycin ophthalmic ointment      Place a 1/2 inch ribbon of ointment into the lower eyelid.   1 g   0    BP 135/75  Pulse 73  Temp(Src) 98.4 F (36.9 C) (Oral)  Resp 16  SpO2 100%  LMP 01/17/2013 Physical Exam  Nursing note and vitals reviewed. Constitutional: She appears well-developed and well-nourished. No distress.  HENT:  Head: Normocephalic and atraumatic.  Eyes: Conjunctivae and EOM are normal. Pupils are equal, round, and reactive to light. Right eye exhibits no discharge. Left eye exhibits no chemosis, no discharge, no exudate and no hordeolum. Foreign body present in the left eye. Right conjunctiva is not injected. Left conjunctiva is not injected. No scleral icterus. Right eye exhibits normal extraocular motion and no nystagmus. Left eye exhibits normal extraocular motion and no nystagmus.  Slit lamp exam:      The right eye shows corneal abrasion and fluorescein uptake. The right eye shows no corneal flare, no corneal ulcer, no foreign body, no hyphema and no hypopyon.    Neck: Neck supple.  Neurological: She is alert.  Skin: No rash noted. No erythema.    ED Course  Procedures (including critical care time) Labs Reviewed - No data to display No results found. 1. Corneal abrasion, right, initial encounter     MDM  Linear  conjunctival- and corneal abrasion. Patient has been started on erythromycin ointment. Instructed to followup with the ophthalmologist if no significant improvement in next 48-72 hours. No obvious foramen bodies were seen  Jimmie Molly, MD 02/09/13 [phone]

## 2013-02-09 NOTE — ED Notes (Addendum)
States she has felt a FB sensation in her right eye since Friday last week , feels like a grain of sand in her eye. Wakes w her eye crusted. 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

## 2013-04-25 ENCOUNTER — Ambulatory Visit: Payer: Self-pay | Admitting: Obstetrics

## 2013-04-29 ENCOUNTER — Emergency Department (HOSPITAL_COMMUNITY)
Admission: EM | Admit: 2013-04-29 | Discharge: 2013-04-29 | Disposition: A | Discharge status: Home / Self Care | Payer: BC Managed Care – PPO | Source: Home / Self Care | Attending: Emergency Medicine | Admitting: Emergency Medicine

## 2013-04-29 ENCOUNTER — Encounter (HOSPITAL_COMMUNITY): Payer: Self-pay | Admitting: Emergency Medicine

## 2013-04-29 DIAGNOSIS — J020 Streptococcal pharyngitis: Secondary | ICD-10-CM

## 2013-04-29 MED ORDER — PENICILLIN V POTASSIUM 500 MG PO TABS: 500.0000 mg | ORAL_TABLET | Freq: Three times a day (TID) | ORAL | Status: DC

## 2013-04-29 NOTE — ED Provider Notes (Signed)
Chief Complaint:   Chief Complaint  Patient presents with  . 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 She also has had nasal congestion, rhinorrhea with clear drainage, headache, cough productive yellow sputum, and nausea. She denies any fever, chills, hoarseness, earache, redness of the eyes or drainage, wheezing, chest pain, vomiting, or diarrhea. She's had no known exposure to strep.  Review of Systems:  Other than as noted above, the patient denies any of the following symptoms. Systemic:  No fever, chills, sweats, fatigue, myalgias, headache, or anorexia. Eye:  No redness, pain or drainage. ENT:  No earache, ear congestion, nasal congestion, sneezing, rhinorrhea, sinus pressure, sinus pain, or post nasal drip. Lungs:  No cough, sputum production, wheezing, shortness of breath, or chest pain. GI:  No abdominal pain, nausea, vomiting, or diarrhea. Skin:  No rash or itching.  PMFSH:  Past medical history, family history, social history, meds, allergies, and nurse's notes were reviewed.  There is no known exposure to strep or mono.  No prior history of step or mono.  The patient denies use of tobacco.   Physical Exam:   Vital signs:  BP 121/71  Pulse 92  Temp(Src) 99.6 F (37.6 C) (Oral)  Resp 20  SpO2 99% General:  Alert, in no distress. Eye:  No conjunctival injection or drainage. Lids were normal. ENT:  TMs and canals were normal, without erythema or inflammation.  Nasal mucosa was clear and uncongested, without drainage.  Mucous membranes were moist.  Exam of pharynx reveals tonsils to be enlarged, erythematous, and with spots of white exudate.  There were no oral ulcerations or lesions. Neck:  Supple, no adenopathy, tenderness or mass. Lungs:  No respiratory distress.  Lungs were clear to auscultation, without wheezes, rales or rhonchi.  Breath  sounds were clear and equal bilaterally.  Heart:  Regular rhythm, without gallops, murmers or rubs. Skin:  Clear, warm, and dry, without rash or lesions.  Labs:   Results for orders placed during the hospital encounter of 04/29/13  POCT RAPID STREP A ([facility] ONLY)      Result Value Range   Streptococcus, Group A Screen (Direct) POSITIVE (*) NEGATIVE   Assessment:  The encounter diagnosis was Strep throat.  No evidence of peritonsillar abscess.  Plan:   1.  Meds:  The following meds were prescribed:   New Prescriptions   PENICILLIN V POTASSIUM (VEETID) 500 MG TABLET    Take 1 tablet (500 mg total) by mouth 3 (three) times daily.    2.  Patient Education/Counseling:  The patient was given appropriate handouts, self care instructions, and instructed in symptomatic relief, including hot saline gargles, throat lozenges, infectious precautions, and need to trade out toothbrush.    3.  Follow up:  The patient was told to follow up if no better in 3 to 4 days, if becoming worse in any way, and given some red flag symptoms such as difficulty swallowing or breathing which would prompt immediate return.  Follow up here if necessary.     Reuben Likes, MD 04/29/13 [phone]

## 2013-04-29 NOTE — ED Notes (Signed)
C/o sore throat, onset yesterday.  No fever, no ear pain.  Reports cough with discolored phlegm-yellow phlegm.  No one at home sick, but coworker is sick

## 2013-05-18 ENCOUNTER — Ambulatory Visit: Payer: Self-pay | Admitting: Obstetrics & Gynecology

## 2013-06-28 ENCOUNTER — Ambulatory Visit: Payer: Self-pay | Admitting: Advanced Practice Midwife

## 2013-08-10 ENCOUNTER — Ambulatory Visit: Payer: Self-pay | Admitting: Obstetrics

## 2013-08-15 ENCOUNTER — Ambulatory Visit: Payer: Self-pay | Admitting: Obstetrics & Gynecology

## 2013-09-19 ENCOUNTER — Ambulatory Visit: Payer: BC Managed Care – PPO | Admitting: Obstetrics & Gynecology

## 2013-10-03 ENCOUNTER — Ambulatory Visit (INDEPENDENT_AMBULATORY_CARE_PROVIDER_SITE_OTHER): Payer: BC Managed Care – PPO | Admitting: Obstetrics & Gynecology

## 2013-10-03 ENCOUNTER — Encounter: Payer: Self-pay | Admitting: Obstetrics & Gynecology

## 2013-10-03 VITALS — BP 119/73 | HR 73 | Temp 99.4°F | Ht 61.0 in | Wt 191.0 lb

## 2013-10-03 DIAGNOSIS — Z113 Encounter for screening for infections with a predominantly sexual mode of transmission: Secondary | ICD-10-CM

## 2013-10-03 DIAGNOSIS — Z01419 Encounter for gynecological examination (general) (routine) without abnormal findings: Secondary | ICD-10-CM

## 2013-10-03 DIAGNOSIS — Z124 Encounter for screening for malignant neoplasm of cervix: Secondary | ICD-10-CM

## 2013-10-03 NOTE — Patient Instructions (Signed)

## 2013-10-03 NOTE — Progress Notes (Signed)
Subjective:     Diane Wilson is a 33 y.o. female here for a routine exam.  Current complaints: annual exam.  Personal health questionnaire reviewed: yes.   Gynecologic History Patient's last menstrual period was 09/24/2013. Contraception: tubal ligation Last Pap: 2012  Results were: normal  Obstetric History OB History  Gravida Para Term Preterm AB SAB TAB Ectopic Multiple Living  2 2 2       2     # Outcome Date GA Lbr Len/2nd Weight Sex Delivery Anes PTL Lv  2 TRM 03/26/03 5127w0d  7 lb 8 oz (3.402 kg) M LTCS EPI  Y  1 TRM 10/17/01 6627w0d  8 lb 5 oz (3.771 kg) M LTCS EPI  Y       The following portions of the patient's history were reviewed and updated as appropriate: allergies, current medications, past family history, past medical history, past social history, past surgical history and problem list.  Review of Systems Pertinent items are noted in HPI.   Objective:    BP 119/73  Pulse 73  Temp(Src) 99.4 F (37.4 C)  Ht 5\' 1"  (1.549 m)  Wt 191 lb (86.637 kg)  BMI 36.11 kg/m2  LMP 09/24/2013  General Appearance:    Alert, cooperative, no distress, appears stated age  Breast Exam:    No tenderness, masses, or nipple abnormality  Abdomen:     Soft, non-tender, bowel sounds active all four quadrants,    no masses, no organomegaly  Genitalia:    Normal female without lesion, discharge or tenderness      AV uterus, upper limits of     normal size, adnexa NT Assessment:   Healthy female exam.   Plan:   Pap smear HPV GC &  today HIV, RPR Follow up in 2 years for pap

## 2013-10-04 LAB — PAP IG, CT-NG NAA, HPV HIGH-RISK
Chlamydia Probe Amp: NEGATIVE
GC Probe Amp: NEGATIVE
HPV DNA HIGH RISK: NOT DETECTED

## 2013-10-04 LAB — HIV ANTIBODY (ROUTINE TESTING W REFLEX): HIV: NONREACTIVE

## 2013-10-04 LAB — RPR

## 2013-10-20 ENCOUNTER — Encounter (HOSPITAL_COMMUNITY): Payer: Self-pay | Admitting: Emergency Medicine

## 2013-10-20 ENCOUNTER — Emergency Department (INDEPENDENT_AMBULATORY_CARE_PROVIDER_SITE_OTHER)
Admission: EM | Admit: 2013-10-20 | Discharge: 2013-10-20 | Disposition: A | Discharge status: Home / Self Care | Payer: No Typology Code available for payment source | Source: Home / Self Care

## 2013-10-20 DIAGNOSIS — M79609 Pain in unspecified limb: Secondary | ICD-10-CM

## 2013-10-20 DIAGNOSIS — M79642 Pain in left hand: Secondary | ICD-10-CM

## 2013-10-20 MED ORDER — PREDNISONE 10 MG PO TABS: ORAL_TABLET | ORAL | Status: DC

## 2013-10-20 MED ORDER — ACETAMINOPHEN 160 MG/5ML PO SOLN
15.0000 mg/kg | Freq: Once | ORAL | Status: AC
  Administered 2013-10-20: 20:00:00 via ORAL

## 2013-10-20 NOTE — ED Provider Notes (Signed)
CSN: [id number]     Arrival date & time 10/20/13  1736 History   None    No chief complaint on file.  (Consider location/radiation/quality/duration/timing/severity/associated sxs/prior Treatment) HPI Comments: 33 yo female presents with left hand pain with trying to close fist. She notes difficulty making 3rd and 4th digits flex. She denies any previous injury/ strain or hx of problems with her hand or neck. She notes she has been sleeping on pillow oddly and has mild neck stiffness.    Past Medical History  Diagnosis Date  . Mitral valve prolapse    Past Surgical History  Procedure Laterality Date  . Tubal ligation    . Cesarean section     Family History  Problem Relation Age of Onset  . Diabetes    . Hypertension    . Cancer Father    History  Substance Use Topics  . Smoking status: Never Smoker   . Smokeless tobacco: Not on file  . Alcohol Use: No   OB History   Grav Para Term Preterm Abortions TAB SAB Ect Mult Living   2 2 2       2      Review of Systems  Musculoskeletal: Positive for arthralgias, myalgias and neck pain.  All other systems reviewed and are negative.    Allergies  Review of patient's allergies indicates no known allergies.  Home Medications   Current Outpatient Rx  Name  Route  Sig  Dispense  Refill  . penicillin v potassium (VEETID) 500 MG tablet   Oral   Take 1 tablet (500 mg total) by mouth 3 (three) times daily.   30 tablet   0    LMP 09/24/2013 Physical Exam  Nursing note and vitals reviewed. Constitutional: She is oriented to person, place, and time. She appears well-developed and well-nourished.  HENT:  Head: Normocephalic and atraumatic.  Right Ear: External ear normal.  Left Ear: External ear normal.  Nose: Nose normal.  Mouth/Throat: Oropharynx is clear and moist.  Eyes: Conjunctivae and EOM are normal.  Neck: Normal range of motion.  Cardiovascular: Normal rate, regular rhythm, normal heart sounds and intact distal  pulses.   Pulmonary/Chest: Effort normal and breath sounds normal.  Musculoskeletal: She exhibits tenderness.  Decreased aggressive ROm 3rd/ 4th digits left hand, pain with full  Passive flexion. ? Nodules on tendon mid hand  Lymphadenopathy:    She has no cervical adenopathy.  Neurological: She is alert and oriented to person, place, and time. She has normal reflexes. No cranial nerve deficit. Coordination normal.  Skin: Skin is warm and dry.  Psychiatric: She has a normal mood and affect. Judgment normal.    ED Course  Procedures (including critical care time) Labs Review Labs Reviewed - No data to display Imaging Review No results found.   MDM  Dr Denyse Amassorey assisted in evaluation of patient. ? Trigger finger vs radicular pain- Pred DP 10 mg AD. Double Band aide splint applied with instructions on hygiene/ use of hand, REF Hand Specialist. Advised of better sleep hygiene, f/u PCP if neck symptoms increase.   Berenice PrimasMelissa R Ailyne Pawley, PA-C 10/20/13 2203

## 2013-10-20 NOTE — ED Notes (Signed)
Right ring finger swollen

## 2013-10-21 NOTE — ED Provider Notes (Signed)
Medical screening examination/treatment/procedure(s) were performed by a resident physician or non-physician practitioner and as the supervising physician I was immediately available for consultation/collaboration.  Mekaila Tarnow, MD   [place] Ganoe S Guerin Lashomb, MD 10/21/13 0739 

## 2014-05-29 ENCOUNTER — Encounter (HOSPITAL_COMMUNITY): Payer: Self-pay | Admitting: Emergency Medicine

## 2014-06-14 ENCOUNTER — Encounter (HOSPITAL_COMMUNITY): Payer: Self-pay | Admitting: Emergency Medicine

## 2014-06-14 ENCOUNTER — Emergency Department (HOSPITAL_COMMUNITY)
Admission: EM | Admit: 2014-06-14 | Discharge: 2014-06-14 | Disposition: A | Discharge status: Home / Self Care | Payer: No Typology Code available for payment source | Source: Home / Self Care | Attending: Family Medicine | Admitting: Family Medicine

## 2014-06-14 DIAGNOSIS — M545 Low back pain, unspecified: Secondary | ICD-10-CM

## 2014-06-14 MED ORDER — CYCLOBENZAPRINE HCL 5 MG PO TABS: 5.0000 mg | ORAL_TABLET | Freq: Every evening | ORAL | Status: DC | PRN

## 2014-06-14 MED ORDER — DICLOFENAC SODIUM 50 MG PO TBEC: 50.0000 mg | DELAYED_RELEASE_TABLET | Freq: Two times a day (BID) | ORAL | Status: DC | PRN

## 2014-06-14 NOTE — ED Provider Notes (Signed)
Diane Wilson is a 33 y.o. female who presents to Urgent Care today for Back pain. Patient suffered mild right low back pain this morning well getting out of bed. She was able to go to work and continues to have moderate pain. She has tried ibuprofen which helps. No radiating pain weakness or numbness. No fevers or chills. No urinary symptoms vomiting or diarrhea.   Past Medical History  Diagnosis Date  . Mitral valve prolapse    Past Surgical History  Procedure Laterality Date  . Tubal ligation    . Cesarean section     History  Substance Use Topics  . Smoking status: Never Smoker   . Smokeless tobacco: Not on file  . Alcohol Use: No   ROS as above Medications: No current facility-administered medications for this encounter.   Current Outpatient Prescriptions  Medication Sig Dispense Refill  . cyclobenzaprine (FLEXERIL) 5 MG tablet Take 1 tablet (5 mg total) by mouth at bedtime as needed for muscle spasms. 20 tablet 0  . diclofenac (VOLTAREN) 50 MG EC tablet Take 1 tablet (50 mg total) by mouth 2 (two) times daily as needed. 30 tablet 0  . penicillin v potassium (VEETID) 500 MG tablet Take 1 tablet (500 mg total) by mouth 3 (three) times daily. 30 tablet 0   No Known Allergies   Exam:  BP 117/82 mmHg  Pulse 89  Temp(Src) 98.5 F (36.9 C) (Oral)  Resp 16  SpO2 99%  LMP 05/28/2014 Gen: Well NAD Back: nontender spinal midline. Tender palpation right lumbar paraspinal. Normal back range of motion. Negative straight leg raise test and Faber test bilaterally. Lower extremity reflexes and strength are equal and normal bilaterally. Normal gait.  No results found for this or any previous visit (from the past 24 hour(s)). No results found.  Assessment and Plan: 33 y.o. female with lumbago due to myofascial disruption. Treatment with home exercise program diclofenac and Flexeril.  Discussed warning signs or symptoms. Please see discharge instructions. Patient expresses  understanding.     Rodolph BongEvan S Jshawn Hurta, MD 06/14/14 [phone]

## 2014-06-14 NOTE — Discharge Instructions (Signed)
Thank you for coming in today. °Come back or go to the emergency room if you notice new weakness new numbness problems walking or bowel or bladder problems. ° ° °Back Exercises °These exercises may help you when beginning to rehabilitate your injury. Your symptoms may resolve with or without further involvement from your physician, physical therapist or athletic trainer. While completing these exercises, remember:  °· Restoring tissue flexibility helps normal motion to return to the joints. This allows healthier, less painful movement and activity. °· An effective stretch should be held for at least 30 seconds. °· A stretch should never be painful. You should only feel a gentle lengthening or release in the stretched tissue. °STRETCH - Extension, Prone on Elbows  °· Lie on your stomach on the floor, a bed will be too soft. Place your palms about shoulder width apart and at the height of your head. °· Place your elbows under your shoulders. If this is too painful, stack pillows under your chest. °· Allow your body to relax so that your hips drop lower and make contact more completely with the floor. °· Hold this position for __________ seconds. °· Slowly return to lying flat on the floor. °Repeat __________ times. Complete this exercise __________ times per day.  °RANGE OF MOTION - Extension, Prone Press Ups  °· Lie on your stomach on the floor, a bed will be too soft. Place your palms about shoulder width apart and at the height of your head. °· Keeping your back as relaxed as possible, slowly straighten your elbows while keeping your hips on the floor. You may adjust the placement of your hands to maximize your comfort. As you gain motion, your hands will come more underneath your shoulders. °· Hold this position __________ seconds. °· Slowly return to lying flat on the floor. °Repeat __________ times. Complete this exercise __________ times per day.  °RANGE OF MOTION- Quadruped, Neutral Spine  °· Assume a hands  and knees position on a firm surface. Keep your hands under your shoulders and your knees under your hips. You may place padding under your knees for comfort. °· Drop your head and point your tail bone toward the ground below you. This will round out your low back like an angry cat. Hold this position for __________ seconds. °· Slowly lift your head and release your tail bone so that your back sags into a large arch, like an old horse. °· Hold this position for __________ seconds. °· Repeat this until you feel limber in your low back. °· Now, find your "sweet spot." This will be the most comfortable position somewhere between the two previous positions. This is your neutral spine. Once you have found this position, tense your stomach muscles to support your low back. °· Hold this position for __________ seconds. °Repeat __________ times. Complete this exercise __________ times per day.  °STRETCH - Flexion, Single Knee to Chest  °· Lie on a firm bed or floor with both legs extended in front of you. °· Keeping one leg in contact with the floor, bring your opposite knee to your chest. Hold your leg in place by either grabbing behind your thigh or at your knee. °· Pull until you feel a gentle stretch in your low back. Hold __________ seconds. °· Slowly release your grasp and repeat the exercise with the opposite side. °Repeat __________ times. Complete this exercise __________ times per day.  °STRETCH - Hamstrings, Standing °· Stand or sit and extend your right / left   leg, placing your foot on a chair or foot stool °· Keeping a slight arch in your low back and your hips straight forward. °· Lead with your chest and lean forward at the waist until you feel a gentle stretch in the back of your right / left knee or thigh. (When done correctly, this exercise requires leaning only a small distance.) °· Hold this position for __________ seconds. °Repeat __________ times. Complete this stretch __________ times per  day. °STRENGTHENING - Deep Abdominals, Pelvic Tilt  °· Lie on a firm bed or floor. Keeping your legs in front of you, bend your knees so they are both pointed toward the ceiling and your feet are flat on the floor. °· Tense your lower abdominal muscles to press your low back into the floor. This motion will rotate your pelvis so that your tail bone is scooping upwards rather than pointing at your feet or into the floor. °· With a gentle tension and even breathing, hold this position for __________ seconds. °Repeat __________ times. Complete this exercise __________ times per day.  °STRENGTHENING - Abdominals, Crunches  °· Lie on a firm bed or floor. Keeping your legs in front of you, bend your knees so they are both pointed toward the ceiling and your feet are flat on the floor. Cross your arms over your chest. °· Slightly tip your chin down without bending your neck. °· Tense your abdominals and slowly lift your trunk high enough to just clear your shoulder blades. Lifting higher can put excessive stress on the low back and does not further strengthen your abdominal muscles. °· Control your return to the starting position. °Repeat __________ times. Complete this exercise __________ times per day.  °STRENGTHENING - Quadruped, Opposite UE/LE Lift  °· Assume a hands and knees position on a firm surface. Keep your hands under your shoulders and your knees under your hips. You may place padding under your knees for comfort. °· Find your neutral spine and gently tense your abdominal muscles so that you can maintain this position. Your shoulders and hips should form a rectangle that is parallel with the floor and is not twisted. °· Keeping your trunk steady, lift your right hand no higher than your shoulder and then your left leg no higher than your hip. Make sure you are not holding your breath. Hold this position __________ seconds. °· Continuing to keep your abdominal muscles tense and your back steady, slowly return  to your starting position. Repeat with the opposite arm and leg. °Repeat __________ times. Complete this exercise __________ times per day. °Document Released: 08/01/2005 Document Revised: 10/06/2011 Document Reviewed: 10/26/2008 °[employer]® Patient Information ©2015 [employer]. This information is not intended to replace advice given to you by your health care provider. Make sure you discuss any questions you have with your health care provider. ° °

## 2014-06-14 NOTE — ED Notes (Signed)
Reports she pulled her back muscle onset this am while getting out of bed this am Went to work afterwards Denies urinary sx Alert, no signs of acute distress

## 2014-07-24 ENCOUNTER — Encounter: Payer: Self-pay | Admitting: *Deleted

## 2014-07-25 ENCOUNTER — Encounter: Payer: Self-pay | Admitting: Obstetrics & Gynecology

## 2014-10-05 ENCOUNTER — Ambulatory Visit: Payer: No Typology Code available for payment source | Admitting: Certified Nurse Midwife

## 2014-10-24 ENCOUNTER — Ambulatory Visit: Payer: 59 | Admitting: Certified Nurse Midwife

## 2014-11-17 ENCOUNTER — Ambulatory Visit (INDEPENDENT_AMBULATORY_CARE_PROVIDER_SITE_OTHER): Payer: 59 | Admitting: Certified Nurse Midwife

## 2014-11-17 ENCOUNTER — Encounter: Payer: Self-pay | Admitting: Certified Nurse Midwife

## 2014-11-17 VITALS — BP 131/69 | HR 65 | Temp 98.5°F | Ht 62.0 in | Wt 188.0 lb

## 2014-11-17 DIAGNOSIS — E669 Obesity, unspecified: Secondary | ICD-10-CM

## 2014-11-17 DIAGNOSIS — Z01419 Encounter for gynecological examination (general) (routine) without abnormal findings: Secondary | ICD-10-CM | POA: Diagnosis not present

## 2014-11-17 DIAGNOSIS — N939 Abnormal uterine and vaginal bleeding, unspecified: Secondary | ICD-10-CM

## 2014-11-17 LAB — CBC WITH DIFFERENTIAL/PLATELET
BASOS ABS: 0 10*3/uL (ref 0.0–0.1)
BASOS PCT: 0 % (ref 0–1)
EOS ABS: 0.1 10*3/uL (ref 0.0–0.7)
Eosinophils Relative: 1 % (ref 0–5)
HEMATOCRIT: 36.3 % (ref 36.0–46.0)
HEMOGLOBIN: 12 g/dL (ref 12.0–15.0)
Lymphocytes Relative: 47 % — ABNORMAL HIGH (ref 12–46)
Lymphs Abs: 2.4 10*3/uL (ref 0.7–4.0)
MCH: 27.7 pg (ref 26.0–34.0)
MCHC: 33.1 g/dL (ref 30.0–36.0)
MCV: 83.8 fL (ref 78.0–100.0)
MONO ABS: 0.4 10*3/uL (ref 0.1–1.0)
MONOS PCT: 8 % (ref 3–12)
MPV: 9.5 fL (ref 8.6–12.4)
NEUTROS ABS: 2.3 10*3/uL (ref 1.7–7.7)
Neutrophils Relative %: 44 % (ref 43–77)
PLATELETS: 359 10*3/uL (ref 150–400)
RBC: 4.33 MIL/uL (ref 3.87–5.11)
RDW: 14.1 % (ref 11.5–15.5)
WBC: 5.2 10*3/uL (ref 4.0–10.5)

## 2014-11-17 LAB — COMPREHENSIVE METABOLIC PANEL
ALBUMIN: 4.4 g/dL (ref 3.5–5.2)
ALT: 9 U/L (ref 0–35)
AST: 16 U/L (ref 0–37)
Alkaline Phosphatase: 43 U/L (ref 39–117)
BUN: 7 mg/dL (ref 6–23)
CALCIUM: 9.4 mg/dL (ref 8.4–10.5)
CHLORIDE: 100 meq/L (ref 96–112)
CO2: 28 meq/L (ref 19–32)
Creat: 0.76 mg/dL (ref 0.50–1.10)
GLUCOSE: 86 mg/dL (ref 70–99)
POTASSIUM: 3.9 meq/L (ref 3.5–5.3)
SODIUM: 138 meq/L (ref 135–145)
TOTAL PROTEIN: 7.4 g/dL (ref 6.0–8.3)
Total Bilirubin: 0.3 mg/dL (ref 0.2–1.2)

## 2014-11-17 LAB — HEMOGLOBIN A1C
HEMOGLOBIN A1C: 6 % — AB (ref <5.7)
Mean Plasma Glucose: 126 mg/dL — ABNORMAL HIGH (ref <117)

## 2014-11-17 LAB — HDL CHOLESTEROL: HDL: 51 mg/dL (ref >=46)

## 2014-11-17 LAB — TRIGLYCERIDES: TRIGLYCERIDES: 112 mg/dL (ref <150)

## 2014-11-17 LAB — CHOLESTEROL, TOTAL: Cholesterol: 167 mg/dL (ref 0–200)

## 2014-11-17 MED ORDER — TRANEXAMIC ACID 650 MG PO TABS: 1300.0000 mg | ORAL_TABLET | Freq: Three times a day (TID) | ORAL | Status: DC

## 2014-11-17 MED ORDER — LIRAGLUTIDE -WEIGHT MANAGEMENT 18 MG/3ML ~~LOC~~ SOPN: 1.0000 "application " | PEN_INJECTOR | Freq: Every day | SUBCUTANEOUS | Status: DC

## 2014-11-17 NOTE — Addendum Note (Signed)
Addended by: Henriette CombsHATTON, ANDREA L on: 11/17/2014 03:13 PM   Modules accepted: Orders

## 2014-11-17 NOTE — Progress Notes (Signed)
Patient ID: Diane Wilson, female   DOB: 15-Feb-1981, 34 y.o.   MRN: [id number]   Subjective:     Diane Scarcerina N Farace is a 34 y.o. female here for a routine exam.  Current complaints: none.  Chronic medical condition is mitral valve prolapse, just occasional chest pain.  Denies any other medical conditions.  Only surgical operations C-sections.  Denies any hypertension.   Maternal uncle with CVA at age 753 yrs.   Mother, Aunts & Uncles all have DM.  Regular monthly cycles with heavy bleeding for several days soaking tampons in 30 min.  Has had BTL.  Cycles last 7+ days.  Had Mirena IUD in the past.  Counseled on benefits of new IUD.    Personal health questionnaire:  Is patient Ashkenazi Jewish, have a family history of breast and/or ovarian cancer: no Is there a family history of uterine cancer diagnosed at age < 6350, gastrointestinal cancer, urinary tract cancer, family member who is a Personnel officerLynch syndrome-associated carrier: no Is the patient overweight and hypertensive, family history of diabetes, personal history of gestational diabetes, preeclampsia or PCOS: yes Is patient over 855, have PCOS,  family history of premature CHD under age 36, diabetes, smoke, have hypertension or peripheral artery disease:  yes At any time, has a partner hit, kicked or otherwise hurt or frightened you?: no Over the past 2 weeks, have you felt down, depressed or hopeless?: no Over the past 2 weeks, have you felt little interest or pleasure in doing things?:no   Gynecologic History Patient's last menstrual period was 11/06/2014. Contraception: tubal ligation Last Pap: 10/03/13. Results were: normal Last mammogram: N/A.   Obstetric History OB History  Gravida Para Term Preterm AB SAB TAB Ectopic Multiple Living  2 2 2       2     # Outcome Date GA Lbr Len/2nd Weight Sex Delivery Anes PTL Lv  2 Term 03/26/03 5581w0d  3.402 kg (7 lb 8 oz) M CS-LTranv EPI  Y  1 Term 10/17/01 2981w0d  3.771 kg (8 lb 5 oz) M CS-LTranv EPI  Y       Past Medical History  Diagnosis Date  . Mitral valve prolapse     Past Surgical History  Procedure Laterality Date  . Tubal ligation    . Cesarean section       Current outpatient prescriptions:  .  Liraglutide -Weight Management (SAXENDA) 18 MG/3ML SOPN, Inject 1 application into the skin daily., Disp: 1 pen, Rfl: 4 .  tranexamic acid (LYSTEDA) 650 MG TABS tablet, Take 2 tablets (1,300 mg total) by mouth 3 (three) times daily. For the first 5 days of your cycle., Disp: 30 tablet, Rfl: 4 No Known Allergies  History  Substance Use Topics  . Smoking status: Never Smoker   . Smokeless tobacco: Not on file  . Alcohol Use: No    Family History  Problem Relation Age of Onset  . Diabetes    . Hypertension    . Cancer Father       Review of Systems  Constitutional: negative for fatigue and weight loss Respiratory: negative for cough and wheezing Cardiovascular: negative for chest pain, fatigue and palpitations Gastrointestinal: negative for abdominal pain and change in bowel habits Musculoskeletal:negative for myalgias Neurological: negative for gait problems and tremors Behavioral/Psych: negative for abusive relationship, depression Endocrine: negative for temperature intolerance   Genitourinary:negative for genital lesions, hot flashes, sexual problems and vaginal discharge. +abnormal menstrual periods Integument/breast: negative for breast lump, breast tenderness, nipple  discharge and skin lesion(s)    Objective:       BP 131/69 mmHg  Pulse 65  Temp(Src) 98.5 F (36.9 C)  Ht  (1.575 m)  Wt 85.276 kg (188 lb)  BMI 34.38 kg/m2  LMP 11/06/2014 General:   alert  Skin:   no rash or abnormalities  Lungs:   clear to auscultation bilaterally  Heart:   regular rate and rhythm, S1, S2 normal, no murmur, click, rub or gallop  Breasts:   normal without suspicious masses, skin or nipple changes or axillary nodes  Abdomen:  normal findings: no organomegaly, soft,  non-tender and no hernia  Pelvis:  External genitalia: normal general appearance Urinary system: urethral meatus normal and bladder without fullness, nontender Vaginal: normal without tenderness, induration or masses Cervix: normal appearance Adnexa: normal bimanual exam Uterus: anteverted and non-tender, normal size   Lab Review Urine pregnancy test Labs reviewed yes Radiologic studies reviewed yes  50% of 15 min visit spent on counseling and coordination of care.   Assessment:    Healthy female exam.   Obesity AUB   Plan:    Education reviewed: depression evaluation, low fat, low cholesterol diet, safe sex/STD prevention, self breast exams, skin cancer screening and weight bearing exercise. Contraception: tubal ligation.   Meds ordered this encounter  Medications  . tranexamic acid (LYSTEDA) 650 MG TABS tablet    Sig: Take 2 tablets (1,300 mg total) by mouth 3 (three) times daily. For the first 5 days of your cycle.    Dispense:  30 tablet    Refill:  4  . Liraglutide -Weight Management (SAXENDA) 18 MG/3ML SOPN    Sig: Inject 1 application into the skin daily.    Dispense:  1 pen    Refill:  4   Orders Placed This Encounter  Procedures  . US Transvaginal Non-OB    Standing Status: Future     Number of Occurrences:      Standing Expiration Date: 01/17/2016    Order Specific Question:  Reason for Exam (SYMPTOM  OR DIAGNOSIS REQUIRED)    Answer:  AUB    Order Specific Question:  Preferred imaging location?    Answer:  Internal  . Hemoglobin A1c  . CBC with Differential/Platelet  . Comprehensive metabolic panel  . TSH  . Cholesterol, total  . Triglycerides  . HDL cholesterol  . HIV antibody (with reflex)  . Hepatitis B surface antigen  . RPR  . Hepatitis C antibody  . Referral to Nutrition and Diabetes Services    Referral Priority:  Routine    Referral Type:  Consultation    Referral Reason:  Specialty Services Required    Number of Visits Requested:  1      Follow up in 2 months with possible Mirena IUD.

## 2014-11-18 LAB — HIV ANTIBODY (ROUTINE TESTING W REFLEX): HIV 1&2 Ab, 4th Generation: NONREACTIVE

## 2014-11-18 LAB — TSH: TSH: 1.029 u[IU]/mL (ref 0.350–4.500)

## 2014-11-18 LAB — HEPATITIS C ANTIBODY: HCV Ab: NEGATIVE

## 2014-11-18 LAB — HEPATITIS B SURFACE ANTIGEN: HEP B S AG: NEGATIVE

## 2014-11-18 LAB — RPR

## 2014-11-20 LAB — PAP IG AND HPV HIGH-RISK: HPV DNA HIGH RISK: NOT DETECTED

## 2014-11-21 ENCOUNTER — Other Ambulatory Visit: Payer: Self-pay | Admitting: Certified Nurse Midwife

## 2014-11-21 DIAGNOSIS — B3731 Acute candidiasis of vulva and vagina: Secondary | ICD-10-CM

## 2014-11-21 DIAGNOSIS — N760 Acute vaginitis: Secondary | ICD-10-CM

## 2014-11-21 DIAGNOSIS — B9689 Other specified bacterial agents as the cause of diseases classified elsewhere: Secondary | ICD-10-CM

## 2014-11-21 DIAGNOSIS — B373 Candidiasis of vulva and vagina: Secondary | ICD-10-CM

## 2014-11-21 LAB — SURESWAB, VAGINOSIS/VAGINITIS PLUS
ATOPOBIUM VAGINAE: 7 Log (cells/mL)
C. ALBICANS, DNA: DETECTED — AB
C. GLABRATA, DNA: NOT DETECTED
C. TRACHOMATIS RNA, TMA: NOT DETECTED
C. TROPICALIS, DNA: NOT DETECTED
C. parapsilosis, DNA: NOT DETECTED
Gardnerella vaginalis: 8 Log (cells/mL)
LACTOBACILLUS SPECIES: NOT DETECTED Log (cells/mL)
MEGASPHAERA SPECIES: 7.4 Log (cells/mL)
N. gonorrhoeae RNA, TMA: NOT DETECTED
T. vaginalis RNA, QL TMA: NOT DETECTED

## 2014-11-21 MED ORDER — TINIDAZOLE 500 MG PO TABS: 2.0000 g | ORAL_TABLET | Freq: Every day | ORAL | Status: AC

## 2014-11-21 MED ORDER — FLUCONAZOLE 100 MG PO TABS
100.0000 mg | ORAL_TABLET | Freq: Once | ORAL | Status: DC
Start: 2014-11-21 — End: 2015-02-01

## 2014-11-22 ENCOUNTER — Other Ambulatory Visit: Payer: 59

## 2014-11-22 ENCOUNTER — Other Ambulatory Visit: Payer: Self-pay | Admitting: Certified Nurse Midwife

## 2014-11-22 DIAGNOSIS — N939 Abnormal uterine and vaginal bleeding, unspecified: Secondary | ICD-10-CM

## 2014-11-29 ENCOUNTER — Other Ambulatory Visit: Payer: 59

## 2014-11-30 ENCOUNTER — Other Ambulatory Visit: Payer: 59

## 2014-12-04 ENCOUNTER — Telehealth: Payer: Self-pay | Admitting: *Deleted

## 2014-12-04 ENCOUNTER — Encounter: Payer: Self-pay | Admitting: *Deleted

## 2014-12-04 NOTE — Telephone Encounter (Signed)
Patient is interested in a Mirena IUD.  Attempted to contact the patient and left message for patient to call the office.

## 2014-12-07 ENCOUNTER — Other Ambulatory Visit: Payer: 59

## 2014-12-14 ENCOUNTER — Ambulatory Visit (INDEPENDENT_AMBULATORY_CARE_PROVIDER_SITE_OTHER): Payer: 59

## 2014-12-14 DIAGNOSIS — Z01419 Encounter for gynecological examination (general) (routine) without abnormal findings: Secondary | ICD-10-CM | POA: Diagnosis not present

## 2014-12-14 DIAGNOSIS — N939 Abnormal uterine and vaginal bleeding, unspecified: Secondary | ICD-10-CM

## 2014-12-15 NOTE — Telephone Encounter (Signed)
Patient states she is not sure of her schedule at the moment and will call the office on Monday to schedule the appointment.

## 2014-12-20 ENCOUNTER — Other Ambulatory Visit: Payer: Self-pay | Admitting: Certified Nurse Midwife

## 2014-12-20 MED ORDER — METRONIDAZOLE 500 MG PO TABS: 500.0000 mg | ORAL_TABLET | Freq: Two times a day (BID) | ORAL | Status: DC

## 2014-12-20 NOTE — Telephone Encounter (Signed)
Attempted to reach patient about her medication, she called and left a message about one of her medications costing about 80.00 and wanted a cheaper medication.  Unable to reach patient, voicemail message left on phone to call the clinic with more information about which medication she needs.

## 2015-01-10 ENCOUNTER — Encounter: Payer: Self-pay | Admitting: Dietician

## 2015-01-10 ENCOUNTER — Encounter: Payer: 59 | Attending: Certified Nurse Midwife | Admitting: Dietician

## 2015-01-10 DIAGNOSIS — Z713 Dietary counseling and surveillance: Secondary | ICD-10-CM | POA: Insufficient documentation

## 2015-01-10 DIAGNOSIS — Z6833 Body mass index (BMI) 33.0-33.9, adult: Secondary | ICD-10-CM | POA: Insufficient documentation

## 2015-01-10 NOTE — Patient Instructions (Addendum)
-  Increase physical activity  -Aim to walk 5 times a week  -Consider Plate Method with meals  -Fill up on veggies  -Have lean protein with each meal  -Work on having 3 structured meals per day (even if it's just something small)  -Pre portion snacks like graham crackers  -Keep drinking mainly water and limit sodas and sweet tea  -Continue cooking and preparing meals at home

## 2015-01-10 NOTE — Progress Notes (Signed)
  Medical Nutrition Therapy:  Appt start time: 1030 end time:  1115   Assessment:  Primary concerns today: Diane Wilson states that she was referred for obesity. 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 She has 2 boys, ages 41 and 3. She lives alone during the summer while her sons are with their dad. Diane Wilson reports that she feels very tired most of the time, low energy. She tries to relax on the weekends. Recently started trying to cut back on sodas to improve her acne. Her weight has been stable for years and she is not interested in losing; she is comfortable with her current weight. She does not weigh herself at home. She goes grocery shopping 1x a month with no list. [name] prepares most foods at home.  Preferred Learning Style:   No preference indicated   Learning Readiness:   Ready  MEDICATIONS: none   DIETARY INTAKE:  Avoided foods include liver, pork, fried foods.    May snack throughout the day; often skips meals.  24-hr recall:   Wakes up at 5:25 am  B (7:45-8:30 AM): sometimes skips, small amount of grits and 1 piece of bacon, Honey Bunches of Oats, 3 cups of water  Snk ( AM):   L (1:20 PM): salad with grilled chicken, water, red bull Snk (4 PM): Honey Graham S'mores bar, water D ( PM): sometimes skips, hamburger and gravy, rice, and vegetables, Pepsi Snk ( PM):   Goes to bed at 8:30 pm  Beverages: water, Pepsi occasionally, Gatorade, sweet tea (has just started cutting back on sodas)  Usual physical activity: very active at work   Estimated energy needs: 1600-1800 calories  Progress Towards Goal(s):  In progress.   Nutritional Diagnosis:  [place]-2.2 Altered nutrition-related laboratory As related to obesity, erratic meal pattern, excessive carbohydrate intake, and family history of type 2 diabetes.  As evidenced by HgbA1c 6%.    Intervention:  Nutrition counseling provided. Recommended regular meal pattern, weight loss, and exercise for glycemic  control. Explained carbohydrate metabolism and action of glucose in the body. Goals: -Increase physical activity  -Aim to walk 5 times a week -Consider Plate Method with meals  -Fill up on veggies  -Have lean protein with each meal -Work on having 3 structured meals per day (even if it's just something small) -Pre portion snacks like graham crackers -Keep drinking mainly water and limit sodas and sweet tea -Continue cooking and preparing meals at home  Teaching Method Utilized:  Visual Auditory  Handouts given during visit include:  MyPlate  Meal planning card  Barriers to learning/adherence to lifestyle change: none  Demonstrated degree of understanding via:  Teach Back   Monitoring/Evaluation:  Dietary intake, exercise, and body weight in 6 week(s).

## 2015-01-18 NOTE — Telephone Encounter (Signed)
Patient states she is still interested in the Mirena IUD. Patient states she is currently sexually active and not using any protection. Patient advised not to have unprotected intercourse and to call on the first day of her menstrual cycle. Patient verbalized understanding.

## 2015-01-26 ENCOUNTER — Ambulatory Visit (INDEPENDENT_AMBULATORY_CARE_PROVIDER_SITE_OTHER): Payer: 59 | Admitting: Certified Nurse Midwife

## 2015-01-26 ENCOUNTER — Encounter: Payer: Self-pay | Admitting: Certified Nurse Midwife

## 2015-01-26 VITALS — BP 126/76 | HR 67 | Temp 98.2°F | Ht 62.0 in | Wt 184.0 lb

## 2015-01-26 DIAGNOSIS — Z30430 Encounter for insertion of intrauterine contraceptive device: Secondary | ICD-10-CM

## 2015-01-26 DIAGNOSIS — Z01812 Encounter for preprocedural laboratory examination: Secondary | ICD-10-CM

## 2015-01-26 LAB — POCT URINE PREGNANCY: Preg Test, Ur: NEGATIVE

## 2015-01-26 NOTE — Progress Notes (Signed)
Patient ID: Diane Wilson, female   DOB: [birth year], 34 y.o.   MRN: [id number]  IUD Procedure Note   DIAGNOSIS: Desires long-term, reversible contraception   PROCEDURE: IUD placement Performing Provider: Orvilla Cornwallachelle Denney CNM  Patient counseled prior to procedure. I explained risks and benefits of Mirena IUD, reviewed alternative forms of contraception. Patient stated understanding and consented to continue with procedure.   LMP: 01/24/15 Pregnancy Test: Negative Lot #: [id number] Expiration Date: 06/2017   IUD type: [X]  Mirena   [   ] Paraguard    PROCEDURE:  Timeout procedure was performed to ensure right patient and right site.  A bimanual exam was performed to determine the position of the uterus, retroverted. The speculum was placed. The vagina and cervix was sterilized in the usual manner and sterile technique was maintained throughout the course of the procedure. A single toothed tenaculum was applied to the posterior lip of the cervix and gentle traction applied. The depth of the uterus was sounded to 9 cm, after using a medium cervical dilator. With gentle traction on the tenaculum, the IUD was inserted to the appropriate depth and inserted without difficulty.  The string was cut to an estimated 4 cm length. Bleeding was minimal. The patient tolerated the procedure well.   Follow up: The patient tolerated the procedure well without complications.  Standard post-procedure care is explained and return precautions are given.  F/U in 1 month for a string check.    Orvilla Cornwallachelle Denney CNM

## 2015-02-01 ENCOUNTER — Other Ambulatory Visit: Payer: Self-pay | Admitting: Certified Nurse Midwife

## 2015-02-01 DIAGNOSIS — B373 Candidiasis of vulva and vagina: Secondary | ICD-10-CM

## 2015-02-01 DIAGNOSIS — N760 Acute vaginitis: Secondary | ICD-10-CM

## 2015-02-01 DIAGNOSIS — B9689 Other specified bacterial agents as the cause of diseases classified elsewhere: Secondary | ICD-10-CM

## 2015-02-01 DIAGNOSIS — B3731 Acute candidiasis of vulva and vagina: Secondary | ICD-10-CM

## 2015-02-01 LAB — SURESWAB, VAGINOSIS/VAGINITIS PLUS
ATOPOBIUM VAGINAE: NOT DETECTED Log (cells/mL)
C. PARAPSILOSIS, DNA: NOT DETECTED
C. TRACHOMATIS RNA, TMA: NOT DETECTED
C. albicans, DNA: DETECTED — AB
C. glabrata, DNA: NOT DETECTED
C. tropicalis, DNA: NOT DETECTED
Gardnerella vaginalis: 6.1 Log (cells/mL)
LACTOBACILLUS SPECIES: NOT DETECTED Log (cells/mL)
MEGASPHAERA SPECIES: NOT DETECTED Log (cells/mL)
N. gonorrhoeae RNA, TMA: NOT DETECTED
T. VAGINALIS RNA, QL TMA: NOT DETECTED

## 2015-02-01 MED ORDER — TINIDAZOLE 500 MG PO TABS: 2.0000 g | ORAL_TABLET | Freq: Every day | ORAL | Status: AC

## 2015-02-01 MED ORDER — TERCONAZOLE 0.4 % VA CREA: 1.0000 | TOPICAL_CREAM | Freq: Every day | VAGINAL | Status: DC

## 2015-02-01 MED ORDER — FLUCONAZOLE 100 MG PO TABS: 100.0000 mg | ORAL_TABLET | Freq: Once | ORAL | Status: DC

## 2015-02-15 ENCOUNTER — Encounter: Payer: Self-pay | Admitting: *Deleted

## 2015-02-22 ENCOUNTER — Ambulatory Visit: Payer: No Typology Code available for payment source | Admitting: Dietician

## 2015-03-08 ENCOUNTER — Ambulatory Visit: Payer: No Typology Code available for payment source | Admitting: Internal Medicine

## 2015-03-29 ENCOUNTER — Telehealth: Payer: Self-pay | Admitting: Internal Medicine

## 2015-03-29 ENCOUNTER — Ambulatory Visit (INDEPENDENT_AMBULATORY_CARE_PROVIDER_SITE_OTHER): Payer: 59 | Admitting: Internal Medicine

## 2015-03-29 ENCOUNTER — Encounter: Payer: Self-pay | Admitting: Internal Medicine

## 2015-03-29 VITALS — BP 104/70 | HR 70 | Temp 98.7°F | Resp 14 | Ht 62.0 in | Wt 184.8 lb

## 2015-03-29 DIAGNOSIS — Z23 Encounter for immunization: Secondary | ICD-10-CM

## 2015-03-29 DIAGNOSIS — I341 Nonrheumatic mitral (valve) prolapse: Secondary | ICD-10-CM

## 2015-03-29 DIAGNOSIS — M542 Cervicalgia: Secondary | ICD-10-CM | POA: Diagnosis not present

## 2015-03-29 NOTE — Assessment & Plan Note (Signed)
Have ordered PT for the neck to work on muscles and ROM. Advised heat to the area and ibuprofen or tylenol for pain as needed.

## 2015-03-29 NOTE — Telephone Encounter (Signed)
Did you want to send her to ortho? Please advise, thanks.Marland Kitchen

## 2015-03-29 NOTE — Patient Instructions (Addendum)
Keep up the good work on the weight loss and diet!  We are not checking any blood work today but will see you back around May next year to check on the HgA1c for the sugars.   Health Maintenance Adopting a healthy lifestyle and getting preventive care can go a long way to promote health and wellness. Talk with your health care provider about what schedule of regular examinations is right for you. This is a good chance for you to check in with your provider about disease prevention and staying healthy. In between checkups, there are plenty of things you can do on your own. Experts have done a lot of research about which lifestyle changes and preventive measures are most likely to keep you healthy. Ask your health care provider for more information. WEIGHT AND DIET  Eat a healthy diet  Be sure to include plenty of vegetables, fruits, low-fat dairy products, and lean protein.  Do not eat a lot of foods high in solid fats, added sugars, or salt.  Get regular exercise. This is one of the most important things you can do for your health.  Most adults should exercise for at least 150 minutes each week. The exercise should increase your heart rate and make you sweat (moderate-intensity exercise).  Most adults should also do strengthening exercises at least twice a week. This is in addition to the moderate-intensity exercise.  Maintain a healthy weight  Body mass index (BMI) is a measurement that can be used to identify possible weight problems. It estimates body fat based on height and weight. Your health care provider can help determine your BMI and help you achieve or maintain a healthy weight.  For females 54 years of age and older:   A BMI below 18.5 is considered underweight.  A BMI of 18.5 to 24.9 is normal.  A BMI of 25 to 29.9 is considered overweight.  A BMI of 30 and above is considered obese.  Watch levels of cholesterol and blood lipids  You should start having your blood  tested for lipids and cholesterol at 36 years of age, then have this test every 5 years.  You may need to have your cholesterol levels checked more often if:  Your lipid or cholesterol levels are high.  You are older than 36 years of age.  You are at high risk for heart disease.  CANCER SCREENING   Lung Cancer  Lung cancer screening is recommended for adults 15-75 years old who are at high risk for lung cancer because of a history of smoking.  A yearly low-dose CT scan of the lungs is recommended for people who:  Currently smoke.  Have quit within the past 15 years.  Have at least a 30-pack-year history of smoking. A pack year is smoking an average of one pack of cigarettes a day for 1 year.  Yearly screening should continue until it has been 15 years since you quit.  Yearly screening should stop if you develop a health problem that would prevent you from having lung cancer treatment.  Breast Cancer  Practice breast self-awareness. This means understanding how your breasts normally appear and feel.  It also means doing regular breast self-exams. Let your health care provider know about any changes, no matter how small.  If you are in your 20s or 30s, you should have a clinical breast exam (CBE) by a health care provider every 1-3 years as part of a regular health exam.  If you are 40  or older, have a CBE every year. Also consider having a breast X-ray (mammogram) every year.  If you have a family history of breast cancer, talk to your health care provider about genetic screening.  If you are at high risk for breast cancer, talk to your health care provider about having an MRI and a mammogram every year.  Breast cancer gene (BRCA) assessment is recommended for women who have family members with BRCA-related cancers. BRCA-related cancers include:  Breast.  Ovarian.  Tubal.  Peritoneal cancers.  Results of the assessment will determine the need for genetic counseling  and BRCA1 and BRCA2 testing. Cervical Cancer Routine pelvic examinations to screen for cervical cancer are no longer recommended for nonpregnant women who are considered low risk for cancer of the pelvic organs (ovaries, uterus, and vagina) and who do not have symptoms. A pelvic examination may be necessary if you have symptoms including those associated with pelvic infections. Ask your health care provider if a screening pelvic exam is right for you.   The Pap test is the screening test for cervical cancer for women who are considered at risk.  If you had a hysterectomy for a problem that was not cancer or a condition that could lead to cancer, then you no longer need Pap tests.  If you are older than 65 years, and you have had normal Pap tests for the past 10 years, you no longer need to have Pap tests.  If you have had past treatment for cervical cancer or a condition that could lead to cancer, you need Pap tests and screening for cancer for at least 20 years after your treatment.  If you no longer get a Pap test, assess your risk factors if they change (such as having a new sexual partner). This can affect whether you should start being screened again.  Some women have medical problems that increase their chance of getting cervical cancer. If this is the case for you, your health care provider may recommend more frequent screening and Pap tests.  The human papillomavirus (HPV) test is another test that may be used for cervical cancer screening. The HPV test looks for the virus that can cause cell changes in the cervix. The cells collected during the Pap test can be tested for HPV.  The HPV test can be used to screen women 29 years of age and older. Getting tested for HPV can extend the interval between normal Pap tests from three to five years.  An HPV test also should be used to screen women of any age who have unclear Pap test results.  After 36 years of age, women should have HPV testing  as often as Pap tests.  Colorectal Cancer  This type of cancer can be detected and often prevented.  Routine colorectal cancer screening usually begins at 36 years of age and continues through 36 years of age.  Your health care provider may recommend screening at an earlier age if you have risk factors for colon cancer.  Your health care provider may also recommend using home test kits to check for hidden blood in the stool.  A small camera at the end of a tube can be used to examine your colon directly (sigmoidoscopy or colonoscopy). This is done to check for the earliest forms of colorectal cancer.  Routine screening usually begins at age 21.  Direct examination of the colon should be repeated every 5-10 years through 36 years of age. However, you may need to  be screened more often if early forms of precancerous polyps or small growths are found. Skin Cancer  Check your skin from head to toe regularly.  Tell your health care provider about any new moles or changes in moles, especially if there is a change in a mole's shape or color.  Also tell your health care provider if you have a mole that is larger than the size of a pencil eraser.  Always use sunscreen. Apply sunscreen liberally and repeatedly throughout the day.  Protect yourself by wearing long sleeves, pants, a wide-brimmed hat, and sunglasses whenever you are outside. HEART DISEASE, DIABETES, AND HIGH BLOOD PRESSURE   Have your blood pressure checked at least every 1-2 years. High blood pressure causes heart disease and increases the risk of stroke.  If you are between 47 years and 52 years old, ask your health care provider if you should take aspirin to prevent strokes.  Have regular diabetes screenings. This involves taking a blood sample to check your fasting blood sugar level.  If you are at a normal weight and have a low risk for diabetes, have this test once every three years after 36 years of age.  If you are  overweight and have a high risk for diabetes, consider being tested at a younger age or more often. PREVENTING INFECTION  Hepatitis B  If you have a higher risk for hepatitis B, you should be screened for this virus. You are considered at high risk for hepatitis B if:  You were born in a country where hepatitis B is common. Ask your health care provider which countries are considered high risk.  Your parents were born in a high-risk country, and you have not been immunized against hepatitis B (hepatitis B vaccine).  You have HIV or AIDS.  You use needles to inject street drugs.  You live with someone who has hepatitis B.  You have had sex with someone who has hepatitis B.  You get hemodialysis treatment.  You take certain medicines for conditions, including cancer, organ transplantation, and autoimmune conditions. Hepatitis C  Blood testing is recommended for:  Everyone born from [birth year] through 1967.  Anyone with known risk factors for hepatitis C. Sexually transmitted infections (STIs)  You should be screened for sexually transmitted infections (STIs) including gonorrhea and chlamydia if:  You are sexually active and are younger than 36 years of age.  You are older than 36 years of age and your health care provider tells you that you are at risk for this type of infection.  Your sexual activity has changed since you were last screened and you are at an increased risk for chlamydia or gonorrhea. Ask your health care provider if you are at risk.  If you do not have HIV, but are at risk, it may be recommended that you take a prescription medicine daily to prevent HIV infection. This is called pre-exposure prophylaxis (PrEP). You are considered at risk if:  You are sexually active and do not regularly use condoms or know the HIV status of your partner(s).  You take drugs by injection.  You are sexually active with a partner who has HIV. Talk with your health care provider  about whether you are at high risk of being infected with HIV. If you choose to begin PrEP, you should first be tested for HIV. You should then be tested every 3 months for as long as you are taking PrEP.  PREGNANCY   If you are premenopausal and  you may become pregnant, ask your health care provider about preconception counseling.  If you may become pregnant, take 400 to 800 micrograms (mcg) of folic acid every day.  If you want to prevent pregnancy, talk to your health care provider about birth control (contraception). OSTEOPOROSIS AND MENOPAUSE   Osteoporosis is a disease in which the bones lose minerals and strength with aging. This can result in serious bone fractures. Your risk for osteoporosis can be identified using a bone density scan.  If you are 35 years of age or older, or if you are at risk for osteoporosis and fractures, ask your health care provider if you should be screened.  Ask your health care provider whether you should take a calcium or vitamin D supplement to lower your risk for osteoporosis.  Menopause may have certain physical symptoms and risks.  Hormone replacement therapy may reduce some of these symptoms and risks. Talk to your health care provider about whether hormone replacement therapy is right for you.  HOME CARE INSTRUCTIONS   Schedule regular health, dental, and eye exams.  Stay current with your immunizations.   Do not use any tobacco products including cigarettes, chewing tobacco, or electronic cigarettes.  If you are pregnant, do not drink alcohol.  If you are breastfeeding, limit how much and how often you drink alcohol.  Limit alcohol intake to no more than 1 drink per day for nonpregnant women. One drink equals 12 ounces of beer, 5 ounces of wine, or 1 ounces of hard liquor.  Do not use street drugs.  Do not share needles.  Ask your health care provider for help if you need support or information about quitting drugs.  Tell your  health care provider if you often feel depressed.  Tell your health care provider if you have ever been abused or do not feel safe at home. Document Released: 01/27/2011 Document Revised: 11/28/2013 Document Reviewed: 06/15/2013 [facility] Patient Information 2015 [place], [place]. This information is not intended to replace advice given to you by your health care provider. Make sure you discuss any questions you have with your health care provider.

## 2015-03-29 NOTE — Telephone Encounter (Signed)
Pt was in today and Dr. Dorise Hiss told her to see an orthopedic doctor for her pain in neck. She's wondering if she needs to call herself or if she needs a referral. I did not see a referral in the system. Please advise

## 2015-03-29 NOTE — Progress Notes (Signed)
   Subjective:    Patient ID: Diane Wilson, female    DOB: 11/13/1980, 34 y.o.   MRN: [id number]  HPI The patient is a new 34 YO female coming in for left neck pain. She noticed it about 6 months ago and it has gradually worsened. 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 Denies injury new or old to the area. No MVAs in the past. No heavy lifting or new activities. Taking ibuprofen as needed for the pain which helps but still some stiffness in it. Especially when she wakes up she has some mild headache. Occasionally takes tylenol for that at work. No fevers or chills. Working on weight loss and has lost about 4 pounds in the last 6 months.   PMH, [facility], social history reviewed and updated.   Review of Systems  Constitutional: Negative for fever, activity change, appetite change, fatigue and unexpected weight change.  HENT: Negative.   Eyes: Negative.   Respiratory: Negative for cough, chest tightness, shortness of breath and wheezing.   Cardiovascular: Negative for chest pain, palpitations and leg swelling.  Gastrointestinal: Negative for nausea, abdominal pain, diarrhea, constipation and abdominal distention.  Musculoskeletal: Positive for myalgias, neck pain and neck stiffness. Negative for arthralgias and gait problem.  Skin: Negative.   Neurological: Negative.   Psychiatric/Behavioral: Negative.       Objective:   Physical Exam  Constitutional: She is oriented to person, place, and time. She appears well-developed and well-nourished.  HENT:  Head: Normocephalic and atraumatic.  Eyes: EOM are normal.  Neck:  ROM slightly limited by pain  Cardiovascular: Normal rate and regular rhythm.   Click noted  Pulmonary/Chest: Effort normal and breath sounds normal. No respiratory distress. She has no wheezes. She has no rales.  Abdominal: Soft. Bowel sounds are normal. She exhibits no distension. There is no tenderness. There is no rebound.    Musculoskeletal: She exhibits no edema.  Mild tenderness in the muscle in the left neck, no pain in the shoulder, no numbness or radiation.  Neurological: She is alert and oriented to person, place, and time. Coordination normal.  Skin: Skin is warm and dry.  Psychiatric: She has a normal mood and affect.   Filed Vitals:   03/29/15 0951  BP: 104/70  Pulse: 70  Temp: 98.7 F (37.1 C)  TempSrc: Oral  Resp: 14  Height:  (1.575 m)  Weight: 184 lb 12.8 oz (83.825 kg)  SpO2: 98%      Assessment & Plan:  Flu shot given at visit.

## 2015-03-29 NOTE — Assessment & Plan Note (Signed)
Click heard on exam but echo in 2013 did not see MVP.

## 2015-03-29 NOTE — Telephone Encounter (Signed)
She was referred to PT and it is in the system and she will hear.

## 2015-03-29 NOTE — Progress Notes (Signed)
Pre visit review using our clinic review tool, if applicable. No additional management support is needed unless otherwise documented below in the visit note. 

## 2015-04-16 ENCOUNTER — Ambulatory Visit: Payer: 59 | Attending: Internal Medicine

## 2015-04-16 DIAGNOSIS — M436 Torticollis: Secondary | ICD-10-CM | POA: Diagnosis present

## 2015-04-16 DIAGNOSIS — R293 Abnormal posture: Secondary | ICD-10-CM | POA: Diagnosis present

## 2015-04-16 DIAGNOSIS — M542 Cervicalgia: Secondary | ICD-10-CM | POA: Diagnosis not present

## 2015-04-16 DIAGNOSIS — R6889 Other general symptoms and signs: Secondary | ICD-10-CM | POA: Diagnosis present

## 2015-04-16 DIAGNOSIS — G8929 Other chronic pain: Secondary | ICD-10-CM | POA: Insufficient documentation

## 2015-04-16 NOTE — Therapy (Signed)
[facility] [address] [place], [place], [address] Phone: [phone]   Fax:  [phone]  Physical Therapy Evaluation  Patient Details  Name: Diane Wilson MRN: [id number] Date of Birth: Nov 25, 1980 Referring Damarco Keysor:  Judie Bonus, MD  Encounter Date: 04/16/2015      PT End of Session - 04/16/15 0838    Visit Number 1   Number of Visits 12   Date for PT Re-Evaluation 05/28/15   PT Start Time 0755   PT Stop Time 0838   PT Time Calculation (min) 43 min   Activity Tolerance Patient tolerated treatment well   Behavior During Therapy [facility] for tasks assessed/performed      Past Medical History  Diagnosis Date  . Mitral valve prolapse   . Frequent headaches     Past Surgical History  Procedure Laterality Date  . Tubal ligation    . Cesarean section      There were no vitals filed for this visit.  Visit Diagnosis:  Neck pain of over 3 months duration - Plan: PT plan of care cert/re-cert  Activity intolerance - Plan: PT plan of care cert/re-cert  Stiffness of neck - Plan: PT plan of care cert/re-cert  Abnormal posture - Plan: PT plan of care cert/re-cert      Subjective Assessment - 04/16/15 0759    Subjective She reports onset of pain without injury. .    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 As needed    How long can you stand comfortably? as needed   How long can you walk comfortably? as needed   Diagnostic tests None   Patient Stated Goals To be able tpo turn head without pain.    Currently in Pain? No/denies  Has pain with turning neck  8/10   Multiple Pain Sites No            [facility] PT Assessment - 04/16/15 0752    Assessment   Medical Diagnosis LT neck pain   Onset Date/Surgical Date --  a year   Next MD Visit PRN   Prior Therapy No   Precautions   Precautions None   Restrictions   Weight Bearing Restrictions No   Balance Screen   Has the patient fallen in the past 6 months No   Has the patient had a decrease in activity level because of a fear of falling?  No   Is the patient reluctant to leave their home because of a fear of falling?  No   Prior Function   Level of Independence Independent   Cognition   Overall Cognitive Status Within Functional Limits for tasks assessed   Posture/Postural Control   Posture Comments sits with RT shoulder lower and head tilted to LT.    ROM / Strength   AROM / PROM / Strength AROM;PROM;Strength   AROM   AROM Assessment Site Cervical   Cervical Flexion 50   Cervical Extension 57   Cervical - Right Side Bend 34   Cervical - Left Side Bend 27   Cervical - Right Rotation 60   Cervical - Left Rotation 20   PROM   PROM Assessment Site Cervical   Strength   Overall Strength Comments Normal UE strength      FOTO: 58%  Carry, moving,handling objects  PT Education - 04/16/15 [phone]    Education provided Yes   Education Details POC and HEP   Person(s) Educated Patient   Methods Explanation;Demonstration;Tactile cues;Verbal cues;Handout   Comprehension Returned demonstration;Verbalized understanding          PT Short Term Goals - 04/16/15 0844    PT SHORT TERM GOAL #1   Title She will be independent with inital HEP   Time 3   Period Weeks   Status New   PT SHORT TERM GOAL #2   Title She will improve active LT rotation to 45 degrees   Time 3   Period Weeks   Status New   PT SHORT TERM GOAL #3   Title She will sidebend to LT 35 degrees    Time 3   Period Weeks   Status New           PT Long Term Goals - 04/16/15 0844    PT LONG TERM GOAL #1   Title She will be independent with all HEP issude as of last visit   Time 6   Period Weeks   Status New   PT LONG TERM GOAL #2   Title She will demo awareness of good posture     Time 6   Period Weeks   Status New   PT LONG TERM GOAL #3   Title She will be able to rotate head 65 degrees or better  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    Time 6   Period Weeks   Status New   PT LONG TERM GOAL #4   Title Neck side bensing >40 degrees to allow for turning head painfrees   Time 6   Period Weeks   Status New               Plan - 04/16/15 1191    Clinical Impression Statement Ms Farve presents with cervical pain on movement to LT with rotation and sidebending.  She has no pain other wise. Passively her range is 25% or better from normal. She should do well with PT   Pt will benefit from skilled therapeutic intervention in order to improve on the following deficits Decreased range of motion;Pain;Postural dysfunction   Rehab Potential Good   PT Frequency 2x / week   PT Duration 6 weeks   PT Treatment/Interventions Passive range of motion;Dry needling;Manual techniques;Taping;Patient/family education;Ultrasound;Cryotherapy;Iontophoresis /ml Dexamethasone;Traction;Therapeutic exercise   PT Next Visit Plan Manual, [place], traction,posture   PT Home Exercise Plan posture   Consulted and Agree with Plan of Care Patient         Problem List Patient Active Problem List   Diagnosis Date Noted  . Neck pain on left side 03/29/2015  . Mitral valve prolapse 12/18/2011    Caprice Red PT 04/16/2015, 8:52 AM  [facility] [address] [place], [place], [address] Phone: [phone]   Fax:  [phone]

## 2015-04-16 NOTE — Patient Instructions (Signed)
Issued cross postural syndrome hand out from cabinet and had he do cervical and scapula retraction and gentle side bend stretch and  To correct posture of LT neck side bend positioning . She will do these every hour or 2

## 2015-04-19 ENCOUNTER — Ambulatory Visit: Payer: 59 | Admitting: Physical Therapy

## 2015-04-19 DIAGNOSIS — M436 Torticollis: Secondary | ICD-10-CM

## 2015-04-19 DIAGNOSIS — M542 Cervicalgia: Secondary | ICD-10-CM

## 2015-04-19 DIAGNOSIS — R293 Abnormal posture: Secondary | ICD-10-CM

## 2015-04-19 DIAGNOSIS — G8929 Other chronic pain: Secondary | ICD-10-CM

## 2015-04-19 DIAGNOSIS — R6889 Other general symptoms and signs: Secondary | ICD-10-CM

## 2015-04-19 NOTE — Therapy (Signed)
[place], [place], [address] Phone: [phone]   Fax:  [phone]  Physical Therapy Treatment  Patient Details  Name: Diane Wilson MRN: [id number] Date of Birth: 08-09-1980 Referring Provider:  Olga Millers, MD  Encounter Date: 04/19/2015      PT End of Session - 04/19/15 0852    Visit Number 2   Number of Visits 12   Date for PT Re-Evaluation 05/28/15   PT Start Time 0846   PT Stop Time 0945   PT Time Calculation (min) 59 min      Past Medical History  Diagnosis Date  . Mitral valve prolapse   . Frequent headaches     Past Surgical History  Procedure Laterality Date  . Tubal ligation    . Cesarean section      There were no vitals filed for this visit.  Visit Diagnosis:  Stiffness of neck  Abnormal posture  Activity intolerance  Neck pain of over 3 months duration      Subjective Assessment - 04/19/15 0851    Subjective I think I over did it trying to look left over and over   Currently in Pain? No/denies   Aggravating Factors  6/10 pain with turning head   Pain Relieving Factors not turning head            [facility] PT Assessment - 04/19/15 0850    AROM   Cervical - Right Side Bend 34   Cervical - Left Side Bend 32   Cervical - Right Rotation 60   Cervical - Left Rotation 45                     [facility] Adult PT Treatment/Exercise - 04/19/15 0001    Neck Exercises: Prone   Neck Retraction 10 reps   Other Prone Exercise Prone rhomboid stretch via childs pose with basket weave 30 sec x 2 each side- pt feels good stretch right side.    Modalities   Modalities Moist Heat   Moist Heat Therapy   Number Minutes Moist Heat 15 Minutes   Moist Heat Location Cervical   Manual Therapy   Manual Therapy Soft tissue mobilization;Passive ROM   Soft tissue mobilization cervical paraspinals, suboccipitals, scalenes left>right   Passive ROM Cervical rotation and  side bend    Neck Exercises: Stretches   Upper Trapezius Stretch 3 reps;30 seconds   Upper Trapezius Stretch Limitations with over pressure   Levator Stretch 3 reps;30 seconds   Other Neck Stretches upper trunk rotation stretch via book openings x 3 x 30 sec                  PT Short Term Goals - 04/19/15 3953    PT SHORT TERM GOAL #1   Title She will be independent with inital HEP   Time 3   Period Weeks   Status On-going   PT SHORT TERM GOAL #2   Title She will improve active LT rotation to 45 degrees   Time 3   Period Weeks   Status Achieved   PT SHORT TERM GOAL #3   Title She will sidebend to LT 35 degrees    Time 3   Period Weeks   Status On-going           PT Long Term Goals - 04/16/15 0844    PT LONG TERM GOAL #1   Title She will be independent with all HEP  issude as of last visit   Time 6   Period Weeks   Status New   PT LONG TERM GOAL #2   Title She will demo awareness of good posture    Time 6   Period Weeks   Status New   PT LONG TERM GOAL #3   Title She will be able to rotate head 65 degrees or better  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    Time 6   Period Weeks   Status New   PT LONG TERM GOAL #4   Title Neck side bensing >40 degrees to allow for turning head painfrees   Time 6   Period Weeks   Status New               Plan - 04/19/15 0164    Clinical Impression Statement STG #1, 2 MET, Independent with HEP and cervical rotation improved to 45 degrees left. Instructed pt seated levator and upper trap stretches and added to HEP   PT Next Visit Plan Manual, [place], traction,posture, REVIEW NECK TENSION exercises        Problem List Patient Active Problem List   Diagnosis Date Noted  . Neck pain on left side 03/29/2015  . Mitral valve prolapse 12/18/2011    Dorene Ar, PTA 04/19/2015, 9:47 AM  [place], [place],  [address] Phone: [phone]   Fax:  [phone]

## 2015-04-19 NOTE — Patient Instructions (Signed)
Levator Stretch   Grasp seat or sit on hand on side to be stretched. Turn head toward other side and look down. Use hand on head to gently stretch neck in that position. Hold _30___ seconds. Repeat on other side. Repeat _3___ times. Do _2___ sessions per day.  http://gt2.exer.us/30   Copyright  VHI. All rights reserved.  Side-Bending   One hand on opposite side of head, pull head to side as far as is comfortable. Stop if there is pain. Hold __30__ seconds. Repeat with other hand to other side. Repeat _3___ times. Do ___2_ sessions per day.   Copyright  VHI. All rights reserved.  Scapular Retraction (Standing)   With arms at sides, pinch shoulder blades together. Hold 5 secs Repeat __10__ times per set. Do ____ sets per session. Do _2___ sessions per day.  http://orth.exer.us/944   Copyright  VHI. All rights reserved.  Chin Protraction / Retraction   Slide head forward keeping chin level. Slide head back, pulling chin in. Hold each position _5__ seconds. Repeat _10__ times. Do _2__ sessions per day.  Copyright  VHI. All rights reserved.

## 2015-04-30 ENCOUNTER — Ambulatory Visit: Payer: 59 | Attending: Internal Medicine

## 2015-04-30 DIAGNOSIS — M542 Cervicalgia: Secondary | ICD-10-CM | POA: Insufficient documentation

## 2015-04-30 DIAGNOSIS — R293 Abnormal posture: Secondary | ICD-10-CM | POA: Insufficient documentation

## 2015-04-30 DIAGNOSIS — M436 Torticollis: Secondary | ICD-10-CM | POA: Insufficient documentation

## 2015-04-30 DIAGNOSIS — G8929 Other chronic pain: Secondary | ICD-10-CM | POA: Diagnosis present

## 2015-04-30 NOTE — Patient Instructions (Signed)
TOTAL MOTION RELEASE TRUNK ROTATION STRETCH WITH HANDOUT 1-2X/DAY 12-15 SEC EACH ROTATION AND 2-3 REPS TO EASY SIDE.

## 2015-04-30 NOTE — Therapy (Signed)
[facility] [address] [place], [place], [address] Phone: [phone]   Fax:  [phone]  Physical Therapy Treatment  Patient Details  Name: Diane Wilson MRN: [id number] Date of Birth: [birth year] Referring Provider:  Myrlene Broker, *  Encounter Date: 04/30/2015      PT End of Session - 04/30/15 0851    Visit Number 3   Number of Visits 12   Date for PT Re-Evaluation 05/28/15   PT Start Time 0805   PT Stop Time 0845   PT Time Calculation (min) 40 min   Activity Tolerance Patient tolerated treatment well   Behavior During Therapy [facility] for tasks assessed/performed      Past Medical History  Diagnosis Date  . Mitral valve prolapse   . Frequent headaches     Past Surgical History  Procedure Laterality Date  . Tubal ligation    . Cesarean section      There were no vitals filed for this visit.  Visit Diagnosis:  Stiffness of neck  Abnormal posture  Neck pain of over 3 months duration      Subjective Assessment - 04/30/15 0807    Pertinent History No changes since last seen.   Currently in Pain? No/denies   Aggravating Factors  6/10 wiyth turning head   Pain Relieving Factors Not turning head/neck            [facility] PT Assessment - 04/30/15 0813    AROM   Cervical - Right Side Bend 40   Cervical - Left Side Bend 35   Cervical - Right Rotation 55   Cervical - Left Rotation 40                     [facility] Adult PT Treatment/Exercise - 04/30/15 0816    Neck Exercises: Seated   Other Seated Exercise Thoracic rotation seated per total motion release principals. RT rotation . Cervical LT rotation improved to 48 degrees post.   Other Seated Exercise Reviewed HEP with corrections for technique.    Manual Therapy   Soft tissue mobilization cervical paraspinals, suboccipitals, scalenes left>right   Passive ROM Cervical rotation and side bend     PA and side glide mobs Gr 3-4 C2 to C7 ,  manual traction            PT Education - 04/30/15 0850    Education provided Yes   Education Details Rotation stretch of trunk /thoracic spine   Person(s) Educated Patient   Methods Explanation;Demonstration;Tactile cues;Verbal cues;Handout   Comprehension Returned demonstration;Verbalized understanding          PT Short Term Goals - 04/19/15 6962    PT SHORT TERM GOAL #1   Title She will be independent with inital HEP   Time 3   Period Weeks   Status On-going   PT SHORT TERM GOAL #2   Title She will improve active LT rotation to 45 degrees   Time 3   Period Weeks   Status Achieved   PT SHORT TERM GOAL #3   Title She will sidebend to LT 35 degrees    Time 3   Period Weeks   Status On-going           PT Long Term Goals - 04/16/15 0844    PT LONG TERM GOAL #1   Title She will be independent with all HEP issude as of last visit   Time 6   Period Weeks  Status New   PT LONG TERM GOAL #2   Title She will demo awareness of good posture    Time 6   Period Weeks   Status New   PT LONG TERM GOAL #3   Title She will be able to rotate head 65 degrees or better  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    Time 6   Period Weeks   Status New   PT LONG TERM GOAL #4   Title Neck side bensing >40 degrees to allow for turning head painfrees   Time 6   Period Weeks   Status New               Plan - 04/30/15 0851    Clinical Impression Statement Continues woith limited motion with rotation with pain.. Improved with trunk rotation.    PT Next Visit Plan Maniual , trunk rotational stretching  modalities PRN   Consulted and Agree with Plan of Care Patient        Problem List Patient Active Problem List   Diagnosis Date Noted  . Neck pain on left side 03/29/2015  . Mitral valve prolapse 12/18/2011    Caprice Red PT 04/30/2015, 8:54 AM  [facility] [address] [place], [place],  [address] Phone: [phone]   Fax:  [phone]

## 2015-05-02 ENCOUNTER — Ambulatory Visit: Payer: 59

## 2015-05-02 DIAGNOSIS — R293 Abnormal posture: Secondary | ICD-10-CM

## 2015-05-02 DIAGNOSIS — M436 Torticollis: Secondary | ICD-10-CM | POA: Diagnosis not present

## 2015-05-02 DIAGNOSIS — G8929 Other chronic pain: Secondary | ICD-10-CM

## 2015-05-02 DIAGNOSIS — M542 Cervicalgia: Secondary | ICD-10-CM

## 2015-05-02 NOTE — Therapy (Signed)
[facility] [address] [place], [place], [address] Phone: [phone]   Fax:  [phone]  Physical Therapy Treatment  Patient Details  Name: FRANCELY CRAW MRN: [id number] Date of Birth: 04-03-1981 Referring Provider:  Myrlene Broker, *  Encounter Date: 05/02/2015      PT End of Session - 05/02/15 0837    Visit Number 4   Number of Visits 12   Date for PT Re-Evaluation 05/28/15   PT Start Time 0750   PT Stop Time 0830   PT Time Calculation (min) 40 min   Activity Tolerance Patient tolerated treatment well   Behavior During Therapy [facility] for tasks assessed/performed      Past Medical History  Diagnosis Date  . Mitral valve prolapse   . Frequent headaches     Past Surgical History  Procedure Laterality Date  . Tubal ligation    . Cesarean section      There were no vitals filed for this visit.  Visit Diagnosis:  Stiffness of neck  Abnormal posture  Neck pain of over 3 months duration      Subjective Assessment - 05/02/15 0834    Subjective No pain unless turining head Sleep difficult at times and purchased pllows but they did not help/    Currently in Pain? No/denies   Aggravating Factors  6/10 with turning head            [facility] PT Assessment - 05/02/15 0747    AROM   Cervical Flexion 50   Cervical Extension 55   Cervical - Right Side Bend 43   Cervical - Left Side Bend 32   Cervical - Right Rotation 68   Cervical - Left Rotation 40                     [facility] Adult PT Treatment/Exercise - 05/02/15 0752    Self-Care   Self-Care Posture   Posture REviewed options with  use of neck support and pillow positions to support neck and head   Neck Exercises: Seated   Other Seated Exercise Thoracic rotation seated per total motion release principals. RT rotation . Cervical LT rotation improved to 60 degrees post.. Also added single leg stance from elevated mat RT x10 as RT easier than  LT. Improved LT stand from LT leg post but neck motion  no better .                 PT Education - 05/02/15 [phone]    Education provided Yes   Education Details Total motion release pricipals and options for sleep with neck support   Person(s) Educated Patient   Methods Explanation;Demonstration;Verbal cues   Comprehension Returned demonstration;Verbalized understanding          PT Short Term Goals - 04/19/15 4010    PT SHORT TERM GOAL #1   Title She will be independent with inital HEP   Time 3   Period Weeks   Status On-going   PT SHORT TERM GOAL #2   Title She will improve active LT rotation to 45 degrees   Time 3   Period Weeks   Status Achieved   PT SHORT TERM GOAL #3   Title She will sidebend to LT 35 degrees    Time 3   Period Weeks   Status On-going           PT Long Term Goals - 04/16/15 0844    PT LONG TERM GOAL #  1   Title She will be independent with all HEP issude as of last visit   Time 6   Period Weeks   Status New   PT LONG TERM GOAL #2   Title She will demo awareness of good posture    Time 6   Period Weeks   Status New   PT LONG TERM GOAL #3   Title She will be able to rotate head 65 degrees or better  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    Time 6   Period Weeks   Status New   PT LONG TERM GOAL #4   Title Neck side bensing >40 degrees to allow for turning head painfrees   Time 6   Period Weeks   Status New               Plan - 05/02/15 0981    Clinical Impression Statement Significant improved LT rotation post trunk rotation stretching. . Asked she continue this at home. If not better end of next week may return to MD   PT Next Visit Plan Maniual , trunk rotational stretching  modalities PRN       Check hip extension RT and LT and compare   PT Home Exercise Plan trunk rotational stetching   Consulted and Agree with Plan of Care Patient        Problem List Patient Active Problem List   Diagnosis Date Noted   . Neck pain on left side 03/29/2015  . Mitral valve prolapse 12/18/2011    Caprice Red PT 05/02/2015, 8:40 AM  [facility] [address] [place], [place], [address] Phone: [phone]   Fax:  [phone]

## 2015-05-02 NOTE — Patient Instructions (Signed)
Added single leg stand 10 reps 2-3 sets and flexion with rotation to stretching.

## 2015-05-04 ENCOUNTER — Ambulatory Visit: Payer: 59 | Admitting: Certified Nurse Midwife

## 2015-05-04 ENCOUNTER — Ambulatory Visit (INDEPENDENT_AMBULATORY_CARE_PROVIDER_SITE_OTHER): Payer: 59 | Admitting: Certified Nurse Midwife

## 2015-05-04 ENCOUNTER — Encounter: Payer: Self-pay | Admitting: Certified Nurse Midwife

## 2015-05-04 VITALS — BP 113/71 | HR 76 | Temp 98.2°F | Ht 63.0 in | Wt 188.0 lb

## 2015-05-04 DIAGNOSIS — N390 Urinary tract infection, site not specified: Secondary | ICD-10-CM

## 2015-05-04 DIAGNOSIS — R399 Unspecified symptoms and signs involving the genitourinary system: Secondary | ICD-10-CM | POA: Diagnosis not present

## 2015-05-04 LAB — POCT URINALYSIS DIPSTICK
Bilirubin, UA: NEGATIVE
Blood, UA: 50
GLUCOSE UA: NEGATIVE
KETONES UA: NEGATIVE
Nitrite, UA: NEGATIVE
SPEC GRAV UA: 1.01
UROBILINOGEN UA: NEGATIVE
pH, UA: 6

## 2015-05-04 MED ORDER — TERCONAZOLE 0.4 % VA CREA: 1.0000 | TOPICAL_CREAM | Freq: Every day | VAGINAL | Status: DC

## 2015-05-04 MED ORDER — PHENAZOPYRIDINE HCL 95 MG PO TABS: 95.0000 mg | ORAL_TABLET | Freq: Three times a day (TID) | ORAL | Status: DC | PRN

## 2015-05-04 MED ORDER — NITROFURANTOIN MONOHYD MACRO 100 MG PO CAPS: 100.0000 mg | ORAL_CAPSULE | Freq: Two times a day (BID) | ORAL | Status: AC

## 2015-05-04 MED ORDER — FLUCONAZOLE 100 MG PO TABS: 100.0000 mg | ORAL_TABLET | Freq: Once | ORAL | Status: DC

## 2015-05-04 NOTE — Progress Notes (Signed)
Patient ID: Diane Wilson, female   DOB: 1980/08/07, 34 y.o.   MRN: [id number]   Chief Complaint  Patient presents with  . Urinary Tract Infection    HPI Diane Wilson is a 34 y.o. female.  Here d/t c/o pain in her lower back bilaterally, frequent urination >7X/day, and urinary spasms with urination.  Denies any fever, discharge or odor.  States that she has been drinking cranberry juice and water.  The symptoms started around Tues-Wednesday of this week.  Last treated for BV and yeast in April of this year.  Has Mirena IUD in place and likes it, no more problems with her periods.  States she has recently switched laundry detergents from Gain to Tide.      HPI  Past Medical History  Diagnosis Date  . Mitral valve prolapse   . Frequent headaches     Past Surgical History  Procedure Laterality Date  . Tubal ligation    . Cesarean section      Family History  Problem Relation Age of Onset  . Diabetes    . Hypertension    . Cancer Father   . Diabetes Maternal Aunt   . Diabetes Maternal Uncle   . Diabetes Maternal Grandmother   . Diabetes Maternal Grandfather   . Diabetes Cousin     Social History Social History  Substance Use Topics  . Smoking status: Never Smoker   . Smokeless tobacco: None  . Alcohol Use: No    No Known Allergies  Current Outpatient Prescriptions  Medication Sig Dispense Refill  . fluconazole (DIFLUCAN) 100 MG tablet Take 1 tablet (100 mg total) by mouth once. Repeat dose in 48-72 hour. 3 tablet 0  . nitrofurantoin, macrocrystal-monohydrate, (MACROBID) 100 MG capsule Take 1 capsule (100 mg total) by mouth 2 (two) times daily. 14 capsule 0  . phenazopyridine (PYRIDIUM) 95 MG tablet Take 1 tablet (95 mg total) by mouth 3 (three) times daily as needed for pain. 10 tablet 0  . terconazole (TERAZOL 7) 0.4 % vaginal cream Place 1 applicator vaginally at bedtime. 45 g 0   No current facility-administered medications for this visit.    Review of  Systems Review of Systems Constitutional: negative for fatigue and weight loss Respiratory: negative for cough and wheezing Cardiovascular: negative for chest pain, fatigue and palpitations Gastrointestinal: negative for abdominal pain and change in bowel habits Genitourinary:negative Integument/breast: negative for nipple discharge Musculoskeletal:negative for myalgias Neurological: negative for gait problems and tremors Behavioral/Psych: negative for abusive relationship, depression Endocrine: negative for temperature intolerance     Blood pressure 113/71, pulse 76, temperature 98.2 F (36.8 C), height  (1.6 m), weight 188 lb (85.276 kg), last menstrual period 04/13/2015.  Physical Exam Physical Exam General:   alert  Skin:   no rash or abnormalities  Lungs:   clear to auscultation bilaterally  Heart:   regular rate and rhythm, S1, S2 normal, no murmur, click, rub or gallop  Breasts:   normal without suspicious masses, skin or nipple changes or axillary nodes  Abdomen:  normal findings: no organomegaly, soft, non-tender and no hernia  Pelvis:  External genitalia: normal general appearance Urinary system: urethral meatus normal and bladder without fullness, nontender Vaginal: normal without tenderness, induration or masses Cervix: normal appearance Adnexa: normal bimanual exam Uterus: anteverted and non-tender, normal size    50% of 15 min visit spent on counseling and coordination of care.   Data Reviewed Previous medical hx, labs, meds  Assessment  Probable bladder infection vs urinary cystitis    Plan    Orders Placed This Encounter  Procedures  . Urine culture  . POCT urinalysis dipstick   Meds ordered this encounter  Medications  . phenazopyridine (PYRIDIUM) 95 MG tablet    Sig: Take 1 tablet (95 mg total) by mouth 3 (three) times daily as needed for pain.    Dispense:  10 tablet    Refill:  0  . nitrofurantoin, macrocrystal-monohydrate, (MACROBID)  100 MG capsule    Sig: Take 1 capsule (100 mg total) by mouth 2 (two) times daily.    Dispense:  14 capsule    Refill:  0  . fluconazole (DIFLUCAN) 100 MG tablet    Sig: Take 1 tablet (100 mg total) by mouth once. Repeat dose in 48-72 hour.    Dispense:  3 tablet    Refill:  0  . terconazole (TERAZOL 7) 0.4 % vaginal cream    Sig: Place 1 applicator vaginally at bedtime.    Dispense:  45 g    Refill:  0    Possible management options include: Keflex for antibiotic therapy Follow up as needed.

## 2015-05-07 ENCOUNTER — Other Ambulatory Visit: Payer: Self-pay | Admitting: *Deleted

## 2015-05-07 ENCOUNTER — Ambulatory Visit: Payer: 59

## 2015-05-07 DIAGNOSIS — M436 Torticollis: Secondary | ICD-10-CM

## 2015-05-07 DIAGNOSIS — N390 Urinary tract infection, site not specified: Secondary | ICD-10-CM

## 2015-05-07 DIAGNOSIS — G8929 Other chronic pain: Secondary | ICD-10-CM

## 2015-05-07 DIAGNOSIS — M542 Cervicalgia: Secondary | ICD-10-CM

## 2015-05-07 DIAGNOSIS — R293 Abnormal posture: Secondary | ICD-10-CM

## 2015-05-07 LAB — URINE CULTURE: Colony Count: >=100000

## 2015-05-07 MED ORDER — CIPROFLOXACIN HCL 500 MG PO TABS: 500.0000 mg | ORAL_TABLET | Freq: Two times a day (BID) | ORAL | Status: DC

## 2015-05-07 NOTE — Therapy (Addendum)
[place], [place], [address] Phone: [phone]   Fax:  [phone]  Physical Therapy Treatment  Patient Details  Name: Diane Wilson MRN: [id number] Date of Birth: 1981/06/28 Referring Provider:  Hoyt Koch, *  Encounter Date: 05/07/2015      PT End of Session - 05/07/15 1707    Visit Number 5   Number of Visits 12   Date for PT Re-Evaluation 05/28/15   PT Start Time 0745   PT Stop Time 0825   PT Time Calculation (min) 40 min   Activity Tolerance Patient tolerated treatment well   Behavior During Therapy [facility] for tasks assessed/performed      Past Medical History  Diagnosis Date  . Mitral valve prolapse   . Frequent headaches     Past Surgical History  Procedure Laterality Date  . Tubal ligation    . Cesarean section      There were no vitals filed for this visit.  Visit Diagnosis:  Stiffness of neck  Abnormal posture  Neck pain of over 3 months duration      Subjective Assessment - 05/07/15 0747    Subjective No pain unless turining head.Marland Kitchen UTI makes side hurt.    Currently in Pain? No/denies            [facility] PT Assessment - 05/07/15 0001    AROM   Cervical - Right Side Bend 41   Cervical - Left Side Bend 38   Cervical - Right Rotation 63   Cervical - Left Rotation 56    Rotation RT and LT after treatment were both 60 degrees or more without pain, side bend was 40 degrees or better after session                 [facility] Adult PT Treatment/Exercise - 05/07/15 0752    Neck Exercises: Seated   Other Seated Exercise Thoracic rotation seated per total motion release principals. RT rotation . Cervical LT rotation improved to 60 degrees post.. Overpressure also did not cause pain.    Other Seated Exercise Reviewed HEP with corrections for technique.                   PT Short Term Goals - 05/07/15 1709    PT SHORT TERM GOAL #1   Title She will  be independent with inital HEP   Status Achieved   PT SHORT TERM GOAL #2   Title She will improve active LT rotation to 45 degrees   Status Achieved   PT SHORT TERM GOAL #3   Title She will sidebend to LT 35 degrees    Status Achieved           PT Long Term Goals - 05/07/15 1709    PT LONG TERM GOAL #1   Title She will be independent with all HEP issude as of last visit   Status On-going   PT LONG TERM GOAL #2   Title She will demo awareness of good posture    Status Achieved   PT LONG TERM GOAL #3   Title She will be able to rotate head 65 degrees or better  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    Status Achieved   PT LONG TERM GOAL #4   Title Neck side bensing >40 degrees to allow for turning head painfrees   Status Achieved  Plan - 05/07/15 1708    Clinical Impression Statement She has full painfree neck rotation and side bend with gentle over pressure. She is significantly better and will discharge if doing well in a week.    PT Next Visit Plan Maniual , trunk rotational stretching  modalities PRN          Consulted and Agree with Plan of Care Patient        Problem List Patient Active Problem List   Diagnosis Date Noted  . Neck pain on left side 03/29/2015  . Mitral valve prolapse 12/18/2011    Darrel Hoover PT 05/07/2015, 5:12 PM  [facility] [address] [place], [place], [address] Phone: [phone]   Fax:  [phone]       PHYSICAL THERAPY DISCHARGE SUMMARY  Visits from Start of Care: 5  Current functional level related to goals / functional outcomes: See above   Remaining deficits: Unknown as pt did not return; based on last visit pt doing well and had met all goals.  Plan was to d/c if she maintained current function.   Education / Equipment: HEP  Plan: Patient agrees to discharge.  Patient goals were met. Patient is being discharged due to meeting  the stated rehab goals.  ?????    Laureen Abrahams, PT, DPT 07/18/2015 11:09 AM  [facility] [address], [place] [address]  [phone] (office) [phone] (fax)

## 2015-05-09 ENCOUNTER — Ambulatory Visit: Payer: 59

## 2015-05-09 ENCOUNTER — Other Ambulatory Visit: Payer: Self-pay | Admitting: Certified Nurse Midwife

## 2015-05-22 ENCOUNTER — Encounter: Payer: Self-pay | Admitting: *Deleted

## 2015-10-08 ENCOUNTER — Encounter (HOSPITAL_COMMUNITY): Payer: Self-pay | Admitting: Vascular Surgery

## 2015-10-08 ENCOUNTER — Emergency Department (HOSPITAL_COMMUNITY)
Admission: EM | Admit: 2015-10-08 | Discharge: 2015-10-09 | Disposition: A | Discharge status: Home / Self Care | Payer: Self-pay | Attending: Emergency Medicine | Admitting: Emergency Medicine

## 2015-10-08 DIAGNOSIS — R1110 Vomiting, unspecified: Secondary | ICD-10-CM | POA: Insufficient documentation

## 2015-10-08 DIAGNOSIS — J111 Influenza due to unidentified influenza virus with other respiratory manifestations: Secondary | ICD-10-CM | POA: Insufficient documentation

## 2015-10-08 DIAGNOSIS — Z8679 Personal history of other diseases of the circulatory system: Secondary | ICD-10-CM | POA: Insufficient documentation

## 2015-10-08 LAB — URINALYSIS, ROUTINE W REFLEX MICROSCOPIC
BILIRUBIN URINE: NEGATIVE
Glucose, UA: NEGATIVE mg/dL
KETONES UR: 15 mg/dL — AB
Leukocytes, UA: NEGATIVE
NITRITE: NEGATIVE
PROTEIN: NEGATIVE mg/dL
SPECIFIC GRAVITY, URINE: 1.024 (ref 1.005–1.030)
pH: 6 (ref 5.0–8.0)

## 2015-10-08 LAB — COMPREHENSIVE METABOLIC PANEL
ALBUMIN: 4.2 g/dL (ref 3.5–5.0)
ALK PHOS: 44 U/L (ref 38–126)
ALT: 11 U/L — ABNORMAL LOW (ref 14–54)
ANION GAP: 11 (ref 5–15)
AST: 17 U/L (ref 15–41)
BILIRUBIN TOTAL: 0.7 mg/dL (ref 0.3–1.2)
BUN: <5 mg/dL — ABNORMAL LOW (ref 6–20)
CALCIUM: 9.2 mg/dL (ref 8.9–10.3)
CO2: 24 mmol/L (ref 22–32)
Chloride: 105 mmol/L (ref 101–111)
Creatinine, Ser: 0.9 mg/dL (ref 0.44–1.00)
GFR calc non Af Amer: >60 mL/min (ref >60)
GLUCOSE: 104 mg/dL — AB (ref 65–99)
Potassium: 3.6 mmol/L (ref 3.5–5.1)
Sodium: 140 mmol/L (ref 135–145)
TOTAL PROTEIN: 7.6 g/dL (ref 6.5–8.1)

## 2015-10-08 LAB — CBC
HCT: 38.5 % (ref 36.0–46.0)
HEMOGLOBIN: 12.3 g/dL (ref 12.0–15.0)
MCH: 27.3 pg (ref 26.0–34.0)
MCHC: 31.9 g/dL (ref 30.0–36.0)
MCV: 85.6 fL (ref 78.0–100.0)
Platelets: 286 10*3/uL (ref 150–400)
RBC: 4.5 MIL/uL (ref 3.87–5.11)
RDW: 12.6 % (ref 11.5–15.5)
WBC: 2.6 10*3/uL — ABNORMAL LOW (ref 4.0–10.5)

## 2015-10-08 LAB — URINE MICROSCOPIC-ADD ON

## 2015-10-08 NOTE — ED Notes (Signed)
Pt reports to the ED for eval of generalized body aches, decreased appetite, and dry cough since this am. Pt also reports some N/V. Denies any diarrhea. Pt works at a daycare. Reports chills but unknown fevers. Pt A&OX4, resp e/u, and skin warm and dry.

## 2015-10-09 MED ORDER — BENZONATATE 100 MG PO CAPS
200.0000 mg | ORAL_CAPSULE | Freq: Once | ORAL | Status: AC
  Administered 2015-10-09: 200 mg via ORAL
  Filled 2015-10-09: qty 2

## 2015-10-09 MED ORDER — METOCLOPRAMIDE HCL 10 MG PO TABS
10.0000 mg | ORAL_TABLET | Freq: Once | ORAL | Status: AC
  Administered 2015-10-09: 10 mg via ORAL
  Filled 2015-10-09: qty 1

## 2015-10-09 MED ORDER — KETOROLAC TROMETHAMINE 60 MG/2ML IM SOLN
60.0000 mg | Freq: Once | INTRAMUSCULAR | Status: AC
  Administered 2015-10-09: 60 mg via INTRAMUSCULAR
  Filled 2015-10-09: qty 2

## 2015-10-09 MED ORDER — OSELTAMIVIR PHOSPHATE 75 MG PO CAPS
75.0000 mg | ORAL_CAPSULE | Freq: Once | ORAL | Status: AC
  Administered 2015-10-09: 75 mg via ORAL
  Filled 2015-10-09: qty 1

## 2015-10-09 MED ORDER — OSELTAMIVIR PHOSPHATE 75 MG PO CAPS: 75.0000 mg | ORAL_CAPSULE | Freq: Two times a day (BID) | ORAL | Status: DC

## 2015-10-09 NOTE — Discharge Instructions (Signed)
Influenza, Adult [name], take tamiflu daily for 5 days to shorten the time that you are sick.  Take ibuprofen for body aches.  See your primary care doctor within 3 days for close follow up.  If symptoms worsen, come back to the ED immediately. Thank you. Influenza (flu) is an infection in the mouth, nose, and throat (respiratory tract) caused by a virus. The flu can make you feel very ill. Influenza spreads easily from person to person (contagious).  HOME CARE   Only take medicines as told by your doctor.  Use a cool mist humidifier to make breathing easier.  Get plenty of rest until your fever goes away. This usually takes 3 to 4 days.  Drink enough fluids to keep your pee (urine) clear or pale yellow.  Cover your mouth and nose when you cough or sneeze.  Wash your hands well to avoid spreading the flu.  Stay home from work or school until your fever has been gone for at least 1 full day.  Get a flu shot every year. GET HELP RIGHT AWAY IF:   You have trouble breathing or feel short of breath.  Your skin or nails turn blue.  You have severe neck pain or stiffness.  You have a severe headache, facial pain, or earache.  Your fever gets worse or keeps coming back.  You feel sick to your stomach (nauseous), throw up (vomit), or have watery poop (diarrhea).  You have chest pain.  You have a deep cough that gets worse, or you cough up more thick spit (mucus). MAKE SURE YOU:   Understand these instructions.  Will watch your condition.  Will get help right away if you are not doing well or get worse.   This information is not intended to replace advice given to you by your health care provider. Make sure you discuss any questions you have with your health care provider.   Document Released: 04/22/2008 Document Revised: 08/04/2014 Document Reviewed: 10/13/2011 Elsevier Interactive Patient Education [employer]2016 [employer] Inc.

## 2015-10-09 NOTE — ED Notes (Signed)
Patient presents stating she woke up yesterday with body aches, cough, nausea.  Stated last vomited last night at 6pm  Denies diarrhea  States no appetite but encouraged her to drink plenty of fluids

## 2015-10-09 NOTE — ED Provider Notes (Signed)
CSN: [id number]     Arrival date & time 10/08/15  2034 History   First MD Initiated Contact with Patient 10/09/15 [phone]     Chief Complaint  Patient presents with  . Influenza     (Consider location/radiation/quality/duration/timing/severity/associated sxs/prior Treatment) HPI  Diane Wilson is a 35 y.o. female with no significant past medical history presenting today with body aches. She states this is occurred for the last 24 hours. She has had severe headache and dry cough as well. Patient is concerned that she has the flu. Patient had episode of nausea and vomiting yesterday as well. She denies any diarrhea. She states she has subjective fevers as well. She has not taken any medicine for this. Patient denies getting her flu shot this year. There are no further complaints.  10 Systems reviewed and are negative for acute change except as noted in the HPI.      Past Medical History  Diagnosis Date  . Mitral valve prolapse   . Frequent headaches    Past Surgical History  Procedure Laterality Date  . Tubal ligation    . Cesarean section     Family History  Problem Relation Age of Onset  . Diabetes    . Hypertension    . Cancer Father   . Diabetes Maternal Aunt   . Diabetes Maternal Uncle   . Diabetes Maternal Grandmother   . Diabetes Maternal Grandfather   . Diabetes Cousin    Social History  Substance Use Topics  . Smoking status: Never Smoker   . Smokeless tobacco: None  . Alcohol Use: No   OB History    Gravida Para Term Preterm AB TAB SAB Ectopic Multiple Living   Review of Systems    Allergies  Review of patient's allergies indicates no known allergies.  Home Medications   Prior to Admission medications   Medication Sig Start Date End Date Taking? Authorizing Provider  oseltamivir (TAMIFLU) 75 MG capsule Take 1 capsule (75 mg total) by mouth 2 (two) times daily. 10/09/15   Tomasita Crumble, MD   BP 129/87 mmHg  Pulse 78  Temp(Src) 98.3  F (36.8 C) (Oral)  Resp 18  SpO2 100% Physical Exam  Constitutional: She is oriented to person, place, and time. She appears well-developed and well-nourished. No distress.  HENT:  Head: Normocephalic and atraumatic.  Nose: Nose normal.  Mouth/Throat: Oropharynx is clear and moist. No oropharyngeal exudate.  Eyes: Conjunctivae and EOM are normal. Pupils are equal, round, and reactive to light. No scleral icterus.  Neck: Normal range of motion. Neck supple. No JVD present. No tracheal deviation present. No thyromegaly present.  Cardiovascular: Normal rate, regular rhythm and normal heart sounds.  Exam reveals no gallop and no friction rub.   No murmur heard. Pulmonary/Chest: Effort normal and breath sounds normal. No respiratory distress. She has no wheezes. She exhibits no tenderness.  Abdominal: Soft. Bowel sounds are normal. She exhibits no distension and no mass. There is no tenderness. There is no rebound and no guarding.  Musculoskeletal: Normal range of motion. She exhibits no edema or tenderness.  Lymphadenopathy:    She has no cervical adenopathy.  Neurological: She is alert and oriented to person, place, and time. No cranial nerve deficit. She exhibits normal muscle tone.  Skin: Skin is warm and dry. No rash noted. No erythema. No pallor.  Nursing note and vitals reviewed.   ED Course  Procedures (including critical care time) Labs Review Labs Reviewed  COMPREHENSIVE METABOLIC PANEL - Abnormal; Notable for the following:    Glucose, Bld 104 (*)    BUN <5 (*)    ALT 11 (*)    All other components within normal limits  CBC - Abnormal; Notable for the following:    WBC 2.6 (*)    All other components within normal limits  URINALYSIS, ROUTINE W REFLEX MICROSCOPIC (NOT AT [facility]) - Abnormal; Notable for the following:    APPearance CLOUDY (*)    Hgb urine dipstick SMALL (*)    Ketones, ur 15 (*)    All other components within normal limits  URINE MICROSCOPIC-ADD ON -  Abnormal; Notable for the following:    Squamous Epithelial / LPF 0-5 (*)    Bacteria, UA RARE (*)    All other components within normal limits    Imaging Review No results found. I have personally reviewed and evaluated these images and lab results as part of my medical decision-making.   EKG Interpretation None      MDM   Final diagnoses:  Influenza  Patient presents emergency department for flulike symptoms. I believe this is influenza. She was given Toradol, Reglan, Tessalon Perles for her symptoms. She was given education about Tamiflu and is requesting to take it. She'll be given a prescription for 5 days. She appears well and in no acute distress, vital signs were within her normal limits and she is safe for discharge.  Tomasita CrumbleAdeleke Zaelyn Noack, MD 10/09/15 [phone]

## 2015-10-09 NOTE — ED Notes (Signed)
Discharge instructions and prescription reviewed 

## 2015-11-02 ENCOUNTER — Ambulatory Visit (INDEPENDENT_AMBULATORY_CARE_PROVIDER_SITE_OTHER): Payer: BLUE CROSS/BLUE SHIELD | Admitting: Certified Nurse Midwife

## 2015-11-02 ENCOUNTER — Other Ambulatory Visit: Payer: Self-pay | Admitting: Certified Nurse Midwife

## 2015-11-02 ENCOUNTER — Encounter: Payer: Self-pay | Admitting: Certified Nurse Midwife

## 2015-11-02 VITALS — BP 120/72 | HR 68 | Temp 98.1°F | Wt 182.0 lb

## 2015-11-02 DIAGNOSIS — R238 Other skin changes: Secondary | ICD-10-CM

## 2015-11-02 DIAGNOSIS — Z113 Encounter for screening for infections with a predominantly sexual mode of transmission: Secondary | ICD-10-CM

## 2015-11-02 DIAGNOSIS — Z01419 Encounter for gynecological examination (general) (routine) without abnormal findings: Secondary | ICD-10-CM | POA: Diagnosis not present

## 2015-11-02 DIAGNOSIS — Z30431 Encounter for routine checking of intrauterine contraceptive device: Secondary | ICD-10-CM | POA: Insufficient documentation

## 2015-11-02 DIAGNOSIS — R7303 Prediabetes: Secondary | ICD-10-CM | POA: Insufficient documentation

## 2015-11-02 DIAGNOSIS — Z1389 Encounter for screening for other disorder: Secondary | ICD-10-CM | POA: Diagnosis not present

## 2015-11-02 LAB — POCT URINALYSIS DIPSTICK
BILIRUBIN UA: NEGATIVE
Blood, UA: NEGATIVE
GLUCOSE UA: NEGATIVE
Ketones, UA: NEGATIVE
LEUKOCYTES UA: NEGATIVE
NITRITE UA: NEGATIVE
PH UA: 5
Spec Grav, UA: 1.015
Urobilinogen, UA: NEGATIVE

## 2015-11-02 MED ORDER — TRIAMCINOLONE ACETONIDE 0.5 % EX OINT: 1.0000 "application " | TOPICAL_OINTMENT | Freq: Two times a day (BID) | CUTANEOUS | Status: DC

## 2015-11-02 NOTE — Addendum Note (Signed)
Addended by: Marya LandryFOSTER, Lindsee Labarre D on: 11/02/2015 01:49 PM   Modules accepted: Orders

## 2015-11-02 NOTE — Progress Notes (Signed)
Patient ID: Diane Wilson, female   DOB: 03/10/1981, 35 y.o.   MRN: [id number]    Subjective:        Diane Wilson is a 35 y.o. female here for a routine exam.  Current complaints: none. Chronic medical condition is mitral valve prolapse, just occasional chest pain. Denies any other medical conditions. Only surgical operations C-sections. Denies any hypertension. Maternal uncle with CVA at age 52 yrs. Mother, Aunts & Uncles all have DM. Regular monthly cycles with heavy bleeding for several days soaking tampons in 30 min. Has had BTL. Had Mirena IUD placed last year and is happy with it.    Personal health questionnaire:  Is patient Ashkenazi Jewish, have a family history of breast and/or ovarian cancer: no Is there a family history of uterine cancer diagnosed at age < 23, gastrointestinal cancer, urinary tract cancer, family member who is a Personnel officer syndrome-associated carrier: no Is the patient overweight and hypertensive, family history of diabetes, personal history of gestational diabetes, preeclampsia or PCOS: yes Is patient over 59, have PCOS,  family history of premature CHD under age 102, diabetes, smoke, have hypertension or peripheral artery disease:  yes At any time, has a partner hit, kicked or otherwise hurt or frightened you?: no Over the past 2 weeks, have you felt down, depressed or hopeless?: no Over the past 2 weeks, have you felt little interest or pleasure in doing things?:no   Gynecologic History No LMP recorded. Contraception: IUD and tubal ligation Last Pap: 11/17/14. Results were: normal Last mammogram: N/A.   Obstetric History OB History  Gravida Para Term Preterm AB SAB TAB Ectopic Multiple Living  # Outcome Date GA Lbr Len/2nd Weight Sex Delivery Anes PTL Lv  2 Term 03/26/03 [redacted]w[redacted]d  7 lb 8 oz (3.402 kg) M CS-LTranv EPI  Y  1 Term 10/17/01 [redacted]w[redacted]d  8 lb 5 oz (3.771 kg) M CS-LTranv EPI  Y      Past Medical History  Diagnosis Date   . Mitral valve prolapse   . Frequent headaches     Past Surgical History  Procedure Laterality Date  . Tubal ligation    . Cesarean section      No current outpatient prescriptions on file. No Known Allergies  Social History  Substance Use Topics  . Smoking status: Never Smoker   . Smokeless tobacco: Not on file  . Alcohol Use: No    Family History  Problem Relation Age of Onset  . Diabetes    . Hypertension    . Cancer Father   . Diabetes Maternal Aunt   . Diabetes Maternal Uncle   . Diabetes Maternal Grandmother   . Diabetes Maternal Grandfather   . Diabetes Cousin       Review of Systems  Constitutional: negative for fatigue and weight loss Respiratory: negative for cough and wheezing Cardiovascular: negative for chest pain, fatigue and palpitations Gastrointestinal: negative for abdominal pain and change in bowel habits Musculoskeletal:negative for myalgias Neurological: negative for gait problems and tremors Behavioral/Psych: negative for abusive relationship, depression Endocrine: negative for temperature intolerance   Genitourinary:negative for abnormal menstrual periods, genital lesions, hot flashes, sexual problems and vaginal discharge Integument/breast: negative for breast lump, breast tenderness, nipple discharge and skin lesion(s), + itching all over breast tissue, bilateral nipple piercings    Objective:       BP 120/72 mmHg  Pulse 68  Temp(Src) 98.1 F (36.7  C)  Wt 182 lb (82.555 kg) General:   alert  Skin:   no rash or abnormalities  Lungs:   clear to auscultation bilaterally  Heart:   regular rate and rhythm, S1, S2 normal, no murmur, click, rub or gallop  Breasts:   normal without suspicious masses, skin or nipple changes or axillary nodes, bilateral nipple piercings  Abdomen:  normal findings: no organomegaly, soft, non-tender and no hernia  Pelvis:  External genitalia: normal general appearance Urinary system: urethral meatus normal and  bladder without fullness, nontender Vaginal: normal without tenderness, induration or masses Cervix: normal appearance, IUD strings inside OS Adnexa: normal bimanual exam Uterus: anteverted and non-tender, normal size   Lab Review Urine pregnancy test Labs reviewed yes Radiologic studies reviewed no  50% of 30 min visit spent on counseling and coordination of care.   Assessment:    Healthy female exam.   Obesity  IUD check-up   STD screening exam  Prediabetes  Plan:    Education reviewed: calcium supplements, depression evaluation, low fat, low cholesterol diet, safe sex/STD prevention, self breast exams, skin cancer screening and weight bearing exercise. Contraception: IUD and tubal ligation. Follow up in: 1 year.   No orders of the defined types were placed in this encounter.   Orders Placed This Encounter  Procedures  . POCT urinalysis dipstick    Possible management options include: breast center consult for irritation

## 2015-11-03 LAB — CBC WITH DIFFERENTIAL/PLATELET
BASOS ABS: 0 10*3/uL (ref 0.0–0.2)
Basos: 0 %
EOS (ABSOLUTE): 0.1 10*3/uL (ref 0.0–0.4)
Eos: 2 %
HEMOGLOBIN: 12.3 g/dL (ref 11.1–15.9)
Hematocrit: 38.4 % (ref 34.0–46.6)
Immature Grans (Abs): 0 10*3/uL (ref 0.0–0.1)
Immature Granulocytes: 0 %
LYMPHS ABS: 1.8 10*3/uL (ref 0.7–3.1)
Lymphs: 39 %
MCH: 27.6 pg (ref 26.6–33.0)
MCHC: 32 g/dL (ref 31.5–35.7)
MCV: 86 fL (ref 79–97)
Monocytes Absolute: 0.4 10*3/uL (ref 0.1–0.9)
Monocytes: 8 %
NEUTROS ABS: 2.4 10*3/uL (ref 1.4–7.0)
Neutrophils: 51 %
PLATELETS: 346 10*3/uL (ref 150–379)
RBC: 4.45 x10E6/uL (ref 3.77–5.28)
RDW: 14.3 % (ref 12.3–15.4)
WBC: 4.7 10*3/uL (ref 3.4–10.8)

## 2015-11-03 LAB — COMPREHENSIVE METABOLIC PANEL
A/G RATIO: 1.8 (ref 1.2–2.2)
ALT: 8 IU/L (ref 0–32)
AST: 13 IU/L (ref 0–40)
Albumin: 4.9 g/dL (ref 3.5–5.5)
Alkaline Phosphatase: 53 IU/L (ref 39–117)
BUN/Creatinine Ratio: 10 (ref 9–23)
BUN: 8 mg/dL (ref 6–20)
Bilirubin Total: 0.4 mg/dL (ref 0.0–1.2)
CO2: 26 mmol/L (ref 18–29)
CREATININE: 0.77 mg/dL (ref 0.57–1.00)
Calcium: 9.4 mg/dL (ref 8.7–10.2)
Chloride: 98 mmol/L (ref 96–106)
GFR, EST AFRICAN AMERICAN: 114 mL/min/{1.73_m2} (ref >59)
GFR, EST NON AFRICAN AMERICAN: 99 mL/min/{1.73_m2} (ref >59)
GLOBULIN, TOTAL: 2.7 g/dL (ref 1.5–4.5)
GLUCOSE: 89 mg/dL (ref 65–99)
POTASSIUM: 4.3 mmol/L (ref 3.5–5.2)
SODIUM: 139 mmol/L (ref 134–144)
Total Protein: 7.6 g/dL (ref 6.0–8.5)

## 2015-11-03 LAB — HIV ANTIBODY (ROUTINE TESTING W REFLEX): HIV SCREEN 4TH GENERATION: NONREACTIVE

## 2015-11-03 LAB — HEPATITIS B SURFACE ANTIGEN: Hepatitis B Surface Ag: NEGATIVE

## 2015-11-03 LAB — TSH: TSH: 1.35 u[IU]/mL (ref 0.450–4.500)

## 2015-11-03 LAB — HEPATITIS C ANTIBODY

## 2015-11-03 LAB — RPR: RPR: NONREACTIVE

## 2015-11-03 LAB — HEMOGLOBIN A1C
ESTIMATED AVERAGE GLUCOSE: 128 mg/dL
HEMOGLOBIN A1C: 6.1 % — AB (ref 4.8–5.6)

## 2015-11-05 ENCOUNTER — Other Ambulatory Visit: Payer: Self-pay | Admitting: Certified Nurse Midwife

## 2015-11-05 DIAGNOSIS — A749 Chlamydial infection, unspecified: Secondary | ICD-10-CM

## 2015-11-05 MED ORDER — AZITHROMYCIN 250 MG PO TABS: ORAL_TABLET | ORAL | Status: DC

## 2015-11-06 ENCOUNTER — Encounter: Payer: Self-pay | Admitting: *Deleted

## 2015-11-07 LAB — PAP IG AND HPV HIGH-RISK
HPV, HIGH-RISK: NEGATIVE
PAP SMEAR COMMENT: 0

## 2015-11-09 ENCOUNTER — Other Ambulatory Visit: Payer: Self-pay | Admitting: Certified Nurse Midwife

## 2015-11-09 DIAGNOSIS — B9689 Other specified bacterial agents as the cause of diseases classified elsewhere: Secondary | ICD-10-CM

## 2015-11-09 DIAGNOSIS — N760 Acute vaginitis: Secondary | ICD-10-CM

## 2015-11-09 LAB — NUSWAB VAGINITIS PLUS (VG+)
Atopobium vaginae: HIGH Score — AB
BVAB 2: HIGH Score — AB
CANDIDA GLABRATA, NAA: NEGATIVE
CHLAMYDIA TRACHOMATIS, NAA: POSITIVE — AB
Candida albicans, NAA: NEGATIVE
MEGASPHAERA 1: HIGH {score} — AB
Neisseria gonorrhoeae, NAA: NEGATIVE
TRICH VAG BY NAA: NEGATIVE

## 2015-11-09 MED ORDER — METRONIDAZOLE 500 MG PO TABS: 500.0000 mg | ORAL_TABLET | Freq: Two times a day (BID) | ORAL | Status: DC

## 2016-11-17 ENCOUNTER — Encounter: Payer: Self-pay | Admitting: Obstetrics

## 2016-11-17 ENCOUNTER — Ambulatory Visit (INDEPENDENT_AMBULATORY_CARE_PROVIDER_SITE_OTHER): Payer: BLUE CROSS/BLUE SHIELD | Admitting: Obstetrics

## 2016-11-17 VITALS — BP 125/79 | HR 58 | Ht 62.0 in | Wt 186.0 lb

## 2016-11-17 DIAGNOSIS — Z0000 Encounter for general adult medical examination without abnormal findings: Secondary | ICD-10-CM

## 2016-11-17 DIAGNOSIS — Z01419 Encounter for gynecological examination (general) (routine) without abnormal findings: Secondary | ICD-10-CM | POA: Diagnosis not present

## 2016-11-17 DIAGNOSIS — Z113 Encounter for screening for infections with a predominantly sexual mode of transmission: Secondary | ICD-10-CM | POA: Diagnosis not present

## 2016-11-17 DIAGNOSIS — N898 Other specified noninflammatory disorders of vagina: Secondary | ICD-10-CM | POA: Diagnosis not present

## 2016-11-17 DIAGNOSIS — Z124 Encounter for screening for malignant neoplasm of cervix: Secondary | ICD-10-CM

## 2016-11-17 MED ORDER — PRENATAL PLUS 27-1 MG PO TABS: 1.0000 | ORAL_TABLET | Freq: Every day | ORAL | 11 refills | Status: DC

## 2016-11-17 NOTE — Progress Notes (Signed)
Subjective:        Diane Wilson is a 36 y.o. female here for a routine exam.  Current complaints: None.    Personal health questionnaire:  Is patient Ashkenazi Jewish, have a family history of breast and/or ovarian cancer: no Is there a family history of uterine cancer diagnosed at age < 61, gastrointestinal cancer, urinary tract cancer, family member who is a Personnel officer syndrome-associated carrier: no Is the patient overweight and hypertensive, family history of diabetes, personal history of gestational diabetes, preeclampsia or PCOS: no Is patient over 40, have PCOS,  family history of premature CHD under age 70, diabetes, smoke, have hypertension or peripheral artery disease:  no At any time, has a partner hit, kicked or otherwise hurt or frightened you?: no Over the past 2 weeks, have you felt down, depressed or hopeless?: no Over the past 2 weeks, have you felt little interest or pleasure in doing things?:no   Gynecologic History Patient's last menstrual period was 11/03/2016. Contraception: tubal ligation and also has a Mirena IUD inserted for AUB Last Pap: 2017. Results were: normal Last mammogram: n/a. Results were: n/a  Obstetric History OB History  Gravida Para Term Preterm AB Living  SAB TAB Ectopic Multiple Live Births          2    # Outcome Date GA Lbr Len/2nd Weight Sex Delivery Anes PTL Lv  2 Term 03/26/03 [redacted]w[redacted]d  7 lb 8 oz (3.402 kg) M CS-LTranv EPI  LIV  1 Term 10/17/01 [redacted]w[redacted]d  8 lb 5 oz (3.771 kg) M CS-LTranv EPI  LIV      Past Medical History:  Diagnosis Date  . Frequent headaches   . Mitral valve prolapse     Past Surgical History:  Procedure Laterality Date  . CESAREAN SECTION    . TUBAL LIGATION       Current Outpatient Prescriptions:  .  prenatal vitamin w/FE, FA (PRENATAL 1 + 1) 27-1 MG TABS tablet, Take 1 tablet by mouth daily at 12 noon., Disp: 30 each, Rfl: 11 .  triamcinolone ointment (KENALOG) 0.5 %, Apply 1 application  topically 2 (two) times daily. Apply to areas that are itching., Disp: 30 g, Rfl: 0 No Known Allergies  Social History  Substance Use Topics  . Smoking status: Never Smoker  . Smokeless tobacco: Never Used  . Alcohol use No    Family History  Problem Relation Age of Onset  . Cancer Father   . Diabetes    . Hypertension    . Diabetes Maternal Aunt   . Diabetes Maternal Uncle   . Diabetes Maternal Grandmother   . Diabetes Maternal Grandfather   . Diabetes Cousin       Review of Systems  Constitutional: negative for fatigue and weight loss Respiratory: negative for cough and wheezing Cardiovascular: negative for chest pain, fatigue and palpitations Gastrointestinal: negative for abdominal pain and change in bowel habits Musculoskeletal:negative for myalgias Neurological: negative for gait problems and tremors Behavioral/Psych: negative for abusive relationship, depression Endocrine: negative for temperature intolerance    Genitourinary:negative for abnormal menstrual periods, genital lesions, hot flashes, sexual problems and vaginal discharge Integument/breast: negative for breast lump, breast tenderness, nipple discharge and skin lesion(s)    Objective:       BP 125/79   Pulse (!) 58   Ht  (1.575 m)   Wt 186 lb (84.4 kg)   LMP 11/03/2016   BMI 34.02  kg/m  General:   alert  Skin:   no rash or abnormalities  Lungs:   clear to auscultation bilaterally  Heart:   regular rate and rhythm, S1, S2 normal, no murmur, click, rub or gallop  Breasts:   normal without suspicious masses, skin or nipple changes or axillary nodes  Abdomen:  normal findings: no organomegaly, soft, non-tender and no hernia  Pelvis:  External genitalia: normal general appearance Urinary system: urethral meatus normal and bladder without fullness, nontender Vaginal: normal without tenderness, induration or masses Cervix: normal appearance Adnexa: normal bimanual exam Uterus: anteverted and  non-tender, normal size   Lab Review Urine pregnancy test Labs reviewed yes Radiologic studies reviewed no  50% of 20 min visit spent on counseling and coordination of care.    Assessment:    Healthy female exam.    Plan:    Education reviewed: calcium supplements, depression evaluation, low fat, low cholesterol diet, safe sex/STD prevention, self breast exams and weight bearing exercise. Contraception: tubal ligation. Follow up in: 1 year.   Meds ordered this encounter  Medications  . prenatal vitamin w/FE, FA (PRENATAL 1 + 1) 27-1 MG TABS tablet    Sig: Take 1 tablet by mouth daily at 12 noon.    Dispense:  30 each    Refill:  11   Orders Placed This Encounter  Procedures  . HIV antibody  . Hepatitis B surface antigen  . RPR  . Hepatitis C antibody     Patient ID: Diane Wilson, female   DOB: 1981-04-30, 36 y.o.   MRN: [id number]

## 2016-11-18 LAB — HEPATITIS B SURFACE ANTIGEN: HEP B S AG: NEGATIVE

## 2016-11-18 LAB — HIV ANTIBODY (ROUTINE TESTING W REFLEX): HIV Screen 4th Generation wRfx: NONREACTIVE

## 2016-11-18 LAB — RPR: RPR Ser Ql: NONREACTIVE

## 2016-11-18 LAB — CERVICOVAGINAL ANCILLARY ONLY
BACTERIAL VAGINITIS: NEGATIVE
CHLAMYDIA, DNA PROBE: NEGATIVE
Candida vaginitis: NEGATIVE
Neisseria Gonorrhea: NEGATIVE
TRICH (WINDOWPATH): NEGATIVE

## 2016-11-18 LAB — HEPATITIS C ANTIBODY

## 2016-11-19 LAB — CYTOLOGY - PAP
Diagnosis: NEGATIVE
HPV: DETECTED — AB

## 2017-07-13 ENCOUNTER — Ambulatory Visit: Payer: BLUE CROSS/BLUE SHIELD | Admitting: Obstetrics

## 2017-07-13 ENCOUNTER — Encounter: Payer: Self-pay | Admitting: Obstetrics

## 2017-07-13 VITALS — BP 125/79 | HR 71 | Ht 62.0 in | Wt 189.0 lb

## 2017-07-13 DIAGNOSIS — N898 Other specified noninflammatory disorders of vagina: Secondary | ICD-10-CM

## 2017-07-13 DIAGNOSIS — Z30431 Encounter for routine checking of intrauterine contraceptive device: Secondary | ICD-10-CM | POA: Diagnosis not present

## 2017-07-13 MED ORDER — METRONIDAZOLE 500 MG PO TABS: 500.0000 mg | ORAL_TABLET | Freq: Two times a day (BID) | ORAL | 2 refills | Status: DC

## 2017-07-13 NOTE — Progress Notes (Signed)
RGYN pt presents for problem visit today. Pt c/o vaginal discharge and odor possible yeast.  Pt declines STD testing. Noticed pelvic pain recently wants to be sure IUD is in place.

## 2017-07-13 NOTE — Progress Notes (Signed)
Patient ID: Diane Wilson, female   DOB: 12-04-1980, 36 y.o.   MRN: [id number]  No chief complaint on file.   HPI Diane Scarcerina N Bullinger is a 36 y.o. female.  Malodorous vaginal discharge. HPI  Past Medical History:  Diagnosis Date  . Frequent headaches   . Mitral valve prolapse     Past Surgical History:  Procedure Laterality Date  . CESAREAN SECTION    . TUBAL LIGATION      Family History  Problem Relation Age of Onset  . Cancer Father   . Diabetes Unknown   . Hypertension Unknown   . Diabetes Maternal Aunt   . Diabetes Maternal Uncle   . Diabetes Maternal Grandmother   . Diabetes Maternal Grandfather   . Diabetes Cousin     Social History Social History   Tobacco Use  . Smoking status: Never Smoker  . Smokeless tobacco: Never Used  Substance Use Topics  . Alcohol use: No    Alcohol/week: 0.0 oz  . Drug use: No    No Known Allergies  Current Outpatient Medications  Medication Sig Dispense Refill  . metroNIDAZOLE (FLAGYL) 500 MG tablet Take 1 tablet (500 mg total) by mouth 2 (two) times daily. 14 tablet 2  . prenatal vitamin w/FE, FA (PRENATAL 1 + 1) 27-1 MG TABS tablet Take 1 tablet by mouth daily at 12 noon. (Patient not taking: Reported on 07/13/2017) 30 each 11  . triamcinolone ointment (KENALOG) 0.5 % Apply 1 application topically 2 (two) times daily. Apply to areas that are itching. (Patient not taking: Reported on 07/13/2017) 30 g 0   No current facility-administered medications for this visit.     Review of Systems Review of Systems Constitutional: negative for fatigue and weight loss Respiratory: negative for cough and wheezing Cardiovascular: negative for chest pain, fatigue and palpitations Gastrointestinal: negative for abdominal pain and change in bowel habits Genitourinary:positive for malodorous vaginal discharge Integument/breast: negative for nipple discharge Musculoskeletal:negative for myalgias Neurological: negative for gait problems and  tremors Behavioral/Psych: negative for abusive relationship, depression Endocrine: negative for temperature intolerance      Blood pressure 125/79, pulse 71, height 5\' 2"  (1.575 m), weight 189 lb (85.7 kg), last menstrual period 06/22/2017.  Physical Exam Physical Exam           General:  Alert and no distress Abdomen:  normal findings: no organomegaly, soft, non-tender and no hernia  Pelvis:  External genitalia: normal general appearance Urinary system: urethral meatus normal and bladder without fullness, nontender Vaginal: normal without tenderness, induration or masses Cervix: normal appearance.  IUD string visible and normal length. Adnexa: normal bimanual exam Uterus: anteverted and non-tender, normal size    50% of 15 min visit spent on counseling and coordination of care.   Data Reviewed Wet prep and cultures  Assessment     1. Vaginal discharge Rx: - metroNIDAZOLE (FLAGYL) 500 MG tablet; Take 1 tablet (500 mg total) by mouth 2 (two) times daily.  Dispense: 14 tablet; Refill: 2 - Cervicovaginal ancillary only  2. IUD check up - doing well    Plan    Follow up in 5 months for Annual and Pap  No orders of the defined types were placed in this encounter.  Meds ordered this encounter  Medications  . metroNIDAZOLE (FLAGYL) 500 MG tablet    Sig: Take 1 tablet (500 mg total) by mouth 2 (two) times daily.    Dispense:  14 tablet    Refill:  2

## 2017-07-14 LAB — CERVICOVAGINAL ANCILLARY ONLY
Bacterial vaginitis: POSITIVE — AB
CANDIDA VAGINITIS: NEGATIVE
Trichomonas: NEGATIVE

## 2017-07-15 ENCOUNTER — Other Ambulatory Visit: Payer: Self-pay | Admitting: Obstetrics

## 2017-09-13 ENCOUNTER — Emergency Department (HOSPITAL_COMMUNITY): Payer: BLUE CROSS/BLUE SHIELD

## 2017-09-13 ENCOUNTER — Encounter (HOSPITAL_COMMUNITY): Payer: Self-pay | Admitting: Emergency Medicine

## 2017-09-13 ENCOUNTER — Other Ambulatory Visit: Payer: Self-pay

## 2017-09-13 ENCOUNTER — Emergency Department (HOSPITAL_COMMUNITY)
Admission: EM | Admit: 2017-09-13 | Discharge: 2017-09-14 | Disposition: A | Discharge status: Home / Self Care | Payer: BLUE CROSS/BLUE SHIELD | Attending: Emergency Medicine | Admitting: Emergency Medicine

## 2017-09-13 DIAGNOSIS — Z79899 Other long term (current) drug therapy: Secondary | ICD-10-CM | POA: Insufficient documentation

## 2017-09-13 DIAGNOSIS — K2970 Gastritis, unspecified, without bleeding: Secondary | ICD-10-CM | POA: Diagnosis not present

## 2017-09-13 DIAGNOSIS — K2900 Acute gastritis without bleeding: Secondary | ICD-10-CM

## 2017-09-13 DIAGNOSIS — R079 Chest pain, unspecified: Secondary | ICD-10-CM | POA: Diagnosis present

## 2017-09-13 DIAGNOSIS — R0789 Other chest pain: Secondary | ICD-10-CM | POA: Insufficient documentation

## 2017-09-13 LAB — CBC
HEMATOCRIT: 37.6 % (ref 36.0–46.0)
HEMOGLOBIN: 12.4 g/dL (ref 12.0–15.0)
MCH: 28.8 pg (ref 26.0–34.0)
MCHC: 33 g/dL (ref 30.0–36.0)
MCV: 87.2 fL (ref 78.0–100.0)
Platelets: 302 10*3/uL (ref 150–400)
RBC: 4.31 MIL/uL (ref 3.87–5.11)
RDW: 12.8 % (ref 11.5–15.5)
WBC: 3.8 10*3/uL — ABNORMAL LOW (ref 4.0–10.5)

## 2017-09-13 LAB — BASIC METABOLIC PANEL
ANION GAP: 10 (ref 5–15)
BUN: 6 mg/dL (ref 6–20)
CHLORIDE: 103 mmol/L (ref 101–111)
CO2: 23 mmol/L (ref 22–32)
Calcium: 8.6 mg/dL — ABNORMAL LOW (ref 8.9–10.3)
Creatinine, Ser: 0.82 mg/dL (ref 0.44–1.00)
GFR calc Af Amer: >60 mL/min (ref >60)
GFR calc non Af Amer: >60 mL/min (ref >60)
GLUCOSE: 97 mg/dL (ref 65–99)
Potassium: 3.5 mmol/L (ref 3.5–5.1)
Sodium: 136 mmol/L (ref 135–145)

## 2017-09-13 LAB — I-STAT TROPONIN, ED: Troponin i, poc: 0 ng/mL (ref 0.00–0.08)

## 2017-09-13 LAB — I-STAT BETA HCG BLOOD, ED (MC, WL, AP ONLY): I-stat hCG, quantitative: <5 m[IU]/mL (ref <5)

## 2017-09-13 MED ORDER — GI COCKTAIL ~~LOC~~
30.0000 mL | Freq: Once | ORAL | Status: AC
  Administered 2017-09-13: 30 mL via ORAL
  Filled 2017-09-13: qty 30

## 2017-09-13 NOTE — ED Provider Notes (Signed)
[facility] EMERGENCY DEPARTMENT Provider Note   CSN: [id number] Arrival date & time: 09/13/17  1920     History   Chief Complaint Chief Complaint  Patient presents with  . Chest Pain    HPI Diane Wilson is a 37 y.o. female.  HPI  This a 37 year old female with history of mitral valve prolapse who presents with chest pain.  Onset of chest pain 5 PM.  She reports "it feels like I ate something and it is stuck."  She states that she has not eaten since this morning.  Pain is nonradiating.  It is in the center of her chest.  Currently she rates her pain 8 out of 10.  She has not taken anything for the pain.  Nothing seems to make it better or worse.  She has never had pain like this before.  It has been constant.  No fevers, cough, history of blood clots, leg swelling noted.  She does use a Mirena IUD for birth control.  Denies history of hypertension, hyperlipidemia, diabetes, early family history of heart disease.  She is taking 800 mg of ibuprofen daily for recent tooth extraction.  Past Medical History:  Diagnosis Date  . Frequent headaches   . Mitral valve prolapse     Patient Active Problem List   Diagnosis Date Noted  . IUD check up 11/02/2015  . Prediabetes 11/02/2015  . Neck pain on left side 03/29/2015  . Mitral valve prolapse 12/18/2011    Past Surgical History:  Procedure Laterality Date  . CESAREAN SECTION    . TUBAL LIGATION      OB History    Gravida Para Term Preterm AB Living   2 2 2     2    SAB TAB Ectopic Multiple Live Births           2       Home Medications    Prior to Admission medications   Medication Sig Start Date End Date Taking? Authorizing Provider  ibuprofen (ADVIL,MOTRIN) 800 MG tablet Take 800 mg by mouth every 8 (eight) hours as needed for moderate pain.   Yes [provider]  omeprazole (PRILOSEC) 20 MG capsule Take 1 capsule (20 mg total) by mouth daily. 09/14/17   Horton, Mayer Masker, MD    Family  History Family History  Problem Relation Age of Onset  . Cancer Father   . Diabetes Unknown   . Hypertension Unknown   . Diabetes Maternal Aunt   . Diabetes Maternal Uncle   . Diabetes Maternal Grandmother   . Diabetes Maternal Grandfather   . Diabetes Cousin     Social History Social History   Tobacco Use  . Smoking status: Never Smoker  . Smokeless tobacco: Never Used  Substance Use Topics  . Alcohol use: No    Alcohol/week: 0.0 oz  . Drug use: No     Allergies   Codeine and Oxycontin [oxycodone hcl]   Review of Systems Review of Systems  Constitutional: Negative for fever.  Respiratory: Negative for shortness of breath.   Cardiovascular: Positive for chest pain. Negative for leg swelling.  Gastrointestinal: Negative for abdominal pain, constipation, nausea and vomiting.  Skin: Negative for rash.  Neurological: Negative for dizziness.  All other systems reviewed and are negative.    Physical Exam Updated Vital Signs BP 117/78   Pulse 71   Temp 98.6 F (37 C) (Oral)   Resp 18   Ht 5\' 2"  (1.575 m)  Wt 85.7 kg (189 lb)   SpO2 99%   BMI 34.57 kg/m   Physical Exam  Constitutional: She is oriented to person, place, and time. She appears well-developed and well-nourished.  HENT:  Head: Normocephalic and atraumatic.  Eyes: Pupils are equal, round, and reactive to light.  Neck: Neck supple.  Cardiovascular: Normal rate, regular rhythm and normal pulses.  Murmur heard. Non-tender  Pulmonary/Chest: Effort normal. No respiratory distress. She has no wheezes.  Abdominal: Soft. Bowel sounds are normal.  Mild tenderness palpation of the epigastrium without rebound or guarding  Musculoskeletal:       Right lower leg: She exhibits no edema.       Left lower leg: She exhibits no edema.  Neurological: She is alert and oriented to person, place, and time.  Skin: Skin is warm and dry.  Psychiatric: She has a normal mood and affect.  Nursing note and vitals  reviewed.    ED Treatments / Results  Labs (all labs ordered are listed, but only abnormal results are displayed) Labs Reviewed  BASIC METABOLIC PANEL - Abnormal; Notable for the following components:      Result Value   Calcium 8.6 (*)    All other components within normal limits  CBC - Abnormal; Notable for the following components:   WBC 3.8 (*)    All other components within normal limits  I-STAT TROPONIN, ED  I-STAT BETA HCG BLOOD, ED ([facility], [facility], [facility] ONLY)  I-STAT TROPONIN, ED    EKG  EKG Interpretation  Date/Time:  Sunday September 13 2017 19:25:17 EST Ventricular Rate:  85 PR Interval:  180 QRS Duration: 84 QT Interval:  358 QTC Calculation: 426 R Axis:     Text Interpretation:  Normal sinus rhythm with sinus arrhythmia Low voltage QRS Septal infarct , age undetermined Abnormal ECG No prior for comparison Confirmed by Ross Marcus ([id number]) on 09/13/2017 11:00:54 PM       Radiology Dg Chest 2 View  Result Date: 09/13/2017 CLINICAL DATA:  Mid chest pain and shortness of breath. EXAM: CHEST  2 VIEW COMPARISON:  05/05/2007 FINDINGS: The cardiomediastinal silhouette is within normal limits. The lungs are well inflated and clear. There is no evidence of pleural effusion or pneumothorax. No acute osseous abnormality is identified. IMPRESSION: No active cardiopulmonary disease. Electronically Signed   By: Sebastian Ache M.D.   On: 09/13/2017 20:27    Procedures Procedures (including critical care time)  Medications Ordered in ED Medications  pantoprazole (PROTONIX) EC tablet 40 mg (40 mg Oral Given 09/14/17 0033)  gi cocktail (Maalox,Lidocaine,Donnatal) (30 mLs Oral Given 09/13/17 2326)  acetaminophen (TYLENOL) tablet 650 mg (650 mg Oral Given 09/14/17 0033)     Initial Impression / Assessment and Plan / ED Course  I have reviewed the triage vital signs and the nursing notes.  Pertinent labs & imaging results that were available during my care of the patient were  reviewed by me and considered in my medical decision making (see chart for details).     Patient presents with chest pain.  She is young, nontoxic-appearing and vital signs are reassuring.  Physical exam only notable for mild epigastric pain.  She has been taking ibuprofen.  EKG is nonischemic.  Troponin negative.  Suspect gastritis.  Patient was given a GI cocktail.  On repeat exam, she states she has not had any relief.  She was subsequently given Tylenol and omeprazole.  Repeat troponin is negative.  Doubt ACS.  Low risk PE.  Will have  her discontinue ibuprofen use and start omeprazole.  After history, exam, and medical workup I feel the patient has been appropriately medically screened and is safe for discharge home. Pertinent diagnoses were discussed with the patient. Patient was given return precautions.   Final Clinical Impressions(s) / ED Diagnoses   Final diagnoses:  Atypical chest pain  Acute gastritis without hemorrhage, unspecified gastritis type    ED Discharge Orders        Ordered    omeprazole (PRILOSEC) 20 MG capsule  Daily     09/14/17 0106       Shon BatonHorton, Courtney F, MD 09/14/17 0110

## 2017-09-13 NOTE — ED Triage Notes (Signed)
Reports sudden onset of central chest pressure this evening while watching tv.  No cardiac history.  States it feels like something is stuck in the center of my chest.

## 2017-09-14 LAB — I-STAT TROPONIN, ED: TROPONIN I, POC: 0 ng/mL (ref 0.00–0.08)

## 2017-09-14 MED ORDER — OMEPRAZOLE 20 MG PO CPDR: 20.0000 mg | DELAYED_RELEASE_CAPSULE | Freq: Every day | ORAL | 0 refills | Status: DC

## 2017-09-14 MED ORDER — ACETAMINOPHEN 325 MG PO TABS
650.0000 mg | ORAL_TABLET | Freq: Once | ORAL | Status: AC
  Administered 2017-09-14: 650 mg via ORAL
  Filled 2017-09-14: qty 2

## 2017-09-14 MED ORDER — PANTOPRAZOLE SODIUM 40 MG PO TBEC
40.0000 mg | DELAYED_RELEASE_TABLET | Freq: Every day | ORAL | Status: DC
  Administered 2017-09-14: 40 mg via ORAL
  Filled 2017-09-14: qty 1

## 2017-09-14 NOTE — Discharge Instructions (Signed)
You were seen today for chest pain.  Your workup is reassuring.  You may have some pain related to recent ibuprofen use.  Discontinue ibuprofen.  Take omeprazole daily.

## 2017-10-05 ENCOUNTER — Encounter (HOSPITAL_COMMUNITY): Payer: Self-pay | Admitting: Emergency Medicine

## 2017-10-05 ENCOUNTER — Ambulatory Visit (HOSPITAL_COMMUNITY)
Admission: EM | Admit: 2017-10-05 | Discharge: 2017-10-05 | Disposition: A | Discharge status: Home / Self Care | Payer: BLUE CROSS/BLUE SHIELD | Attending: Family Medicine | Admitting: Family Medicine

## 2017-10-05 ENCOUNTER — Other Ambulatory Visit: Payer: Self-pay

## 2017-10-05 DIAGNOSIS — M25562 Pain in left knee: Secondary | ICD-10-CM

## 2017-10-05 MED ORDER — MELOXICAM 7.5 MG PO TABS: 7.5000 mg | ORAL_TABLET | Freq: Every day | ORAL | 0 refills | Status: DC

## 2017-10-05 NOTE — ED Provider Notes (Signed)
[facility]    CSN: [id number] Arrival date & time: 10/05/17  1446     History   Chief Complaint Chief Complaint  Patient presents with  . Knee Pain    HPI Diane Wilson is a 37 y.o. female.   37 year old female comes in for 2-week history of left knee pain.  No injury noted.  States no pain with resting or sitting, worsening pain with weightbearing and bending.  Denies numbness, tingling, swelling.  Denies spreading erythema, increased warmth, fever.  Denies chest pain, shortness of breath, palpitations.  Has not taken anything for it.      Past Medical History:  Diagnosis Date  . Frequent headaches   . Mitral valve prolapse     Patient Active Problem List   Diagnosis Date Noted  . IUD check up 11/02/2015  . Prediabetes 11/02/2015  . Neck pain on left side 03/29/2015  . Mitral valve prolapse 12/18/2011    Past Surgical History:  Procedure Laterality Date  . CESAREAN SECTION    . TUBAL LIGATION      OB History    Gravida Para Term Preterm AB Living   2 2 2     2    SAB TAB Ectopic Multiple Live Births           2       Home Medications    Prior to Admission medications   Medication Sig Start Date End Date Taking? Authorizing Provider  ibuprofen (ADVIL,MOTRIN) 800 MG tablet Take 800 mg by mouth every 8 (eight) hours as needed for moderate pain.    [provider]  meloxicam (MOBIC) 7.5 MG tablet Take 1 tablet (7.5 mg total) by mouth daily. 10/05/17   Cathie HoopsYu, Amy V, PA-C  omeprazole (PRILOSEC) 20 MG capsule Take 1 capsule (20 mg total) by mouth daily. 09/14/17   Horton, Mayer Maskerourtney F, MD    Family History Family History  Problem Relation Age of Onset  . Cancer Father   . Diabetes Unknown   . Hypertension Unknown   . Diabetes Maternal Aunt   . Diabetes Maternal Uncle   . Diabetes Maternal Grandmother   . Diabetes Maternal Grandfather   . Diabetes Cousin     Social History Social History   Tobacco Use  . Smoking status: Never  Smoker  . Smokeless tobacco: Never Used  Substance Use Topics  . Alcohol use: No    Alcohol/week: 0.0 oz  . Drug use: No     Allergies   Codeine and Oxycontin [oxycodone hcl]   Review of Systems Review of Systems  Reason unable to perform ROS: See HPI as above.     Physical Exam Triage Vital Signs ED Triage Vitals  Enc Vitals Group     BP 10/05/17 1542 116/73     Pulse Rate 10/05/17 1542 92     Resp 10/05/17 1542 (!) 1     Temp 10/05/17 1542 98.4 F (36.9 C)     Temp Source 10/05/17 1542 Oral     SpO2 10/05/17 1542 100 %     Weight --      Height --      Head Circumference --      Peak Flow --      Pain Score 10/05/17 1541 7     Pain Loc --      Pain Edu? --      Excl. in GC? --    No data found.  Updated Vital Signs BP 116/73 (  BP Location: Left Arm)   Pulse 92   Temp 98.4 F (36.9 C) (Oral)   Resp 16   LMP 09/06/2017   SpO2 100%    Physical Exam  Constitutional: She is oriented to person, place, and time. She appears well-developed and well-nourished. No distress.  HENT:  Head: Normocephalic and atraumatic.  Eyes: Conjunctivae are normal. Pupils are equal, round, and reactive to light.  Musculoskeletal:  No swelling, erythema, increased warmth, contusions.  Tenderness to palpation of  medial joint line adjacent to the patella.  Tenderness to palpation of posterior knee without  cords felt.  No tenderness to palpation of the calf.  Full range of motion.  Strength normal and equal bilaterally.  Sensation intact and equal bilaterally.  Negative Homans.  Neurological: She is alert and oriented to person, place, and time.     UC Treatments / Results  Labs (all labs ordered are listed, but only abnormal results are displayed) Labs Reviewed - No data to display  EKG  EKG Interpretation None       Radiology No results found.  Procedures Procedures (including critical care time)  Medications Ordered in UC Medications - No data to  display   Initial Impression / Assessment and Plan / UC Course  I have reviewed the triage vital signs and the nursing notes.  Pertinent labs & imaging results that were available during my care of the patient were reviewed by me and considered in my medical decision making (see chart for details).    37 year old female with 2-week history of atraumatic knee pain.  Will start Mobic, ice compress, elevation, knee sleeve during activity.  Follow-up with PCP if symptoms not improving.  Return precautions given.  Patient expresses understanding and agrees to plan.  Final Clinical Impressions(s) / UC Diagnoses   Final diagnoses:  Acute pain of left knee    ED Discharge Orders        Ordered    meloxicam (MOBIC) 7.5 MG tablet  Daily     10/05/17 1629       Belinda Fisher, PA-C 10/05/17 1636

## 2017-10-05 NOTE — ED Triage Notes (Signed)
Left knee pain for 2 weeks, unknown injury.  No pain just sitting, if bending knee, this is painful or weight-bearing is painful.

## 2017-10-05 NOTE — Discharge Instructions (Signed)
Start Mobic as directed.  Ice compress, elevation.  Knee sleeve during activity.  If symptoms not improving in 2-3 weeks, follow up with PCP for reevaluation needed.

## 2017-12-05 ENCOUNTER — Other Ambulatory Visit: Payer: Self-pay

## 2017-12-05 ENCOUNTER — Encounter (HOSPITAL_COMMUNITY): Payer: Self-pay

## 2017-12-05 ENCOUNTER — Ambulatory Visit (HOSPITAL_COMMUNITY)
Admission: EM | Admit: 2017-12-05 | Discharge: 2017-12-05 | Disposition: A | Discharge status: Home / Self Care | Payer: BLUE CROSS/BLUE SHIELD | Attending: Family Medicine | Admitting: Family Medicine

## 2017-12-05 DIAGNOSIS — M25562 Pain in left knee: Secondary | ICD-10-CM

## 2017-12-05 MED ORDER — PREDNISONE 20 MG PO TABS: 40.0000 mg | ORAL_TABLET | Freq: Every day | ORAL | 0 refills | Status: AC

## 2017-12-05 NOTE — ED Triage Notes (Signed)
Pt presents today with left knee pain that has been going on for months. States that she has not done anything that she knows of to cause injury to knee. Nothing relieves pain. Hurts to bend and now has pain from left knee that goes down to her foot.

## 2017-12-05 NOTE — Discharge Instructions (Signed)
Start Prednisone as directed to decrease inflammation. Ice compress, elevation. Please follow up with orthopedics for further evaluation if symptoms not improving, does not resolve.

## 2017-12-05 NOTE — ED Provider Notes (Signed)
[facility]    CSN: [id number] Arrival date & time: 12/05/17  1200     History   Chief Complaint Chief Complaint  Patient presents with  . Knee Pain    HPI Diane Wilson is a 37 y.o. female.   37 year old female returns to clinic for continued knee pain after being seen 2 months ago. Left knee pain that is atraumatic and mostly at posterior and medial knee. This is made worse by bending, going up and down the stairs. States took mobic for 3 days, which made her feel like she had UTI symptoms, so discontinued medicine after 3 days. States tried again shortly after, which started UTI symptoms again, so she discontinued medicine again. States had one episode when she experienced the pain traveling down the back of the knee, which resolved after stretching and moving the knee. She denies swelling. Denies erythema, increased warmth. Denies new injury/trauma.      Past Medical History:  Diagnosis Date  . Frequent headaches   . Mitral valve prolapse     Patient Active Problem List   Diagnosis Date Noted  . IUD check up 11/02/2015  . Prediabetes 11/02/2015  . Neck pain on left side 03/29/2015  . Mitral valve prolapse 12/18/2011    Past Surgical History:  Procedure Laterality Date  . CESAREAN SECTION    . TUBAL LIGATION      OB History    Gravida  2   Para  2   Term  2   Preterm      AB      Living  2     SAB      TAB      Ectopic      Multiple      Live Births  2            Home Medications    Prior to Admission medications   Medication Sig Start Date End Date Taking? Authorizing Provider  predniSONE (DELTASONE) 20 MG tablet Take 2 tablets (40 mg total) by mouth daily for 5 days. 12/05/17 12/10/17  Belinda Fisher, PA-C    Family History Family History  Problem Relation Age of Onset  . Cancer Father   . Diabetes Unknown   . Hypertension Unknown   . Diabetes Maternal Aunt   . Diabetes Maternal Uncle   . Diabetes Maternal Grandmother    . Diabetes Maternal Grandfather   . Diabetes Cousin     Social History Social History   Tobacco Use  . Smoking status: Never Smoker  . Smokeless tobacco: Never Used  Substance Use Topics  . Alcohol use: No    Alcohol/week: 0.0 oz  . Drug use: No     Allergies   Codeine and Oxycontin [oxycodone hcl]   Review of Systems Review of Systems  Reason unable to perform ROS: See HPI as above.     Physical Exam Triage Vital Signs ED Triage Vitals  Enc Vitals Group     BP 12/05/17 1216 120/87     Pulse Rate 12/05/17 1216 69     Resp 12/05/17 1216 16     Temp 12/05/17 1216 98.3 F (36.8 C)     Temp Source 12/05/17 1216 Oral     SpO2 12/05/17 1216 97 %     Weight --      Height --      Head Circumference --      Peak Flow --  Pain Score 12/05/17 1217 8     Pain Loc --      Pain Edu? --      Excl. in GC? --    No data found.  Updated Vital Signs BP 120/87   Pulse 69   Temp 98.3 F (36.8 C) (Oral)   Resp 16   SpO2 97%   Physical Exam  Constitutional: She is oriented to person, place, and time. She appears well-developed and well-nourished. No distress.  HENT:  Head: Normocephalic and atraumatic.  Eyes: Pupils are equal, round, and reactive to light. Conjunctivae are normal.  Musculoskeletal:  No swelling, erythema, increased warmth, contusions. Tenderness to palpation of posterior knee along tendon. Full ROM, though with pain. 4/5 strength of right knee. Sensation intact and equal bilaterally.   Neurological: She is alert and oriented to person, place, and time.    UC Treatments / Results  Labs (all labs ordered are listed, but only abnormal results are displayed) Labs Reviewed - No data to display  EKG None  Radiology No results found.  Procedures Procedures (including critical care time)  Medications Ordered in UC Medications - No data to display  Initial Impression / Assessment and Plan / UC Course  I have reviewed the triage vital signs  and the nursing notes.  Pertinent labs & imaging results that were available during my care of the patient were reviewed by me and considered in my medical decision making (see chart for details).    Continued atraumatic knee pain, was unable to finish full course of mobic due to side effects. Will try prednisone course. Patient to follow up with orthopedics for further evaluation if symptoms not improving.   Final Clinical Impressions(s) / UC Diagnoses   Final diagnoses:  Acute pain of left knee    ED Prescriptions    Medication Sig Dispense Auth. Provider   predniSONE (DELTASONE) 20 MG tablet Take 2 tablets (40 mg total) by mouth daily for 5 days. 10 tablet Threasa Alpha, PA-C 12/05/17 1346

## 2017-12-13 ENCOUNTER — Other Ambulatory Visit: Payer: Self-pay

## 2017-12-13 ENCOUNTER — Ambulatory Visit (HOSPITAL_COMMUNITY)
Admission: EM | Admit: 2017-12-13 | Discharge: 2017-12-13 | Disposition: A | Discharge status: Home / Self Care | Payer: BLUE CROSS/BLUE SHIELD | Attending: Family Medicine | Admitting: Family Medicine

## 2017-12-13 ENCOUNTER — Encounter (HOSPITAL_COMMUNITY): Payer: Self-pay | Admitting: Emergency Medicine

## 2017-12-13 DIAGNOSIS — R3915 Urgency of urination: Secondary | ICD-10-CM | POA: Diagnosis not present

## 2017-12-13 DIAGNOSIS — R10819 Abdominal tenderness, unspecified site: Secondary | ICD-10-CM

## 2017-12-13 DIAGNOSIS — R509 Fever, unspecified: Secondary | ICD-10-CM

## 2017-12-13 LAB — POCT URINALYSIS DIP (DEVICE)
BILIRUBIN URINE: NEGATIVE
Glucose, UA: NEGATIVE mg/dL
HGB URINE DIPSTICK: NEGATIVE
Nitrite: NEGATIVE
PROTEIN: 30 mg/dL — AB
Specific Gravity, Urine: 1.01 (ref 1.005–1.030)
Urobilinogen, UA: 2 mg/dL — ABNORMAL HIGH (ref 0.0–1.0)

## 2017-12-13 MED ORDER — AMOXICILLIN-POT CLAVULANATE 875-125 MG PO TABS: 1.0000 | ORAL_TABLET | Freq: Two times a day (BID) | ORAL | 0 refills | Status: DC

## 2017-12-13 NOTE — ED Provider Notes (Signed)
12/13/2017 2:23 PM   DOB: 1980-08-28 / MRN: [id number]  SUBJECTIVE:  Diane Wilson is a 37 y.o. female presenting for left flank pain that started about 3 days ago.  She associates fever and chills. Has been taking excedrin today which seems to help.  Denies dysuria. Does have urgency and frequency.  Has IUD in place and denies vaginal discharge.  Normal bowel movements.   She is allergic to codeine and oxycontin [oxycodone hcl].   She  has a past medical history of Frequent headaches and Mitral valve prolapse.    She  reports that she has never smoked. She has never used smokeless tobacco. She reports that she does not drink alcohol or use drugs. She  reports that she currently engages in sexual activity and has had partners who are Female. She reports using the following methods of birth control/protection: None and IUD. The patient  has a past surgical history that includes Tubal ligation and Cesarean section.  Her family history includes Cancer in her father; Diabetes in her cousin, maternal aunt, maternal grandfather, maternal grandmother, maternal uncle, and unknown relative; Hypertension in her unknown relative.  Review of Systems  Constitutional: Positive for chills and fever. Negative for diaphoresis.  Respiratory: Negative for cough.   Cardiovascular: Negative for chest pain.  Gastrointestinal: Positive for abdominal pain. Negative for blood in stool, constipation, diarrhea, heartburn, melena, nausea and vomiting.  Genitourinary: Positive for flank pain, frequency and urgency. Negative for dysuria and hematuria.  Musculoskeletal: Negative for myalgias.  Skin: Negative for rash.  Neurological: Negative for dizziness and headaches.    OBJECTIVE:  BP 119/73 (BP Location: Left Arm) Comment (BP Location): large cuff  Pulse 78   Temp 98.4 F (36.9 C) (Oral)   Resp 18   SpO2 100%   Wt Readings from Last 3 Encounters:  09/13/17 189 lb (85.7 kg)  07/13/17 189 lb (85.7 kg)  11/17/16  186 lb (84.4 kg)   Temp Readings from Last 3 Encounters:  12/13/17 98.4 F (36.9 C) (Oral)  12/05/17 98.3 F (36.8 C) (Oral)  10/05/17 98.4 F (36.9 C) (Oral)   BP Readings from Last 3 Encounters:  12/13/17 119/73  12/05/17 120/87  10/05/17 116/73   Pulse Readings from Last 3 Encounters:  12/13/17 78  12/05/17 69  10/05/17 92    Physical Exam  Constitutional: She is oriented to person, place, and time. She appears well-nourished. No distress.  Eyes: Pupils are equal, round, and reactive to light. EOM are normal.  Cardiovascular: Normal rate, regular rhythm, S1 normal, S2 normal, normal heart sounds and intact distal pulses. Exam reveals no gallop, no friction rub and no decreased pulses.  No murmur heard. Pulmonary/Chest: Effort normal. No stridor. No respiratory distress. She has no wheezes. She has no rales.  Abdominal: Soft. Bowel sounds are normal. She exhibits no distension and no mass. There is tenderness. There is CVA tenderness (left flank). There is no rigidity, no rebound, no guarding, no tenderness at McBurney's point and negative Murphy's sign. No hernia.  Musculoskeletal: She exhibits no edema.  Neurological: She is alert and oriented to person, place, and time. No cranial nerve deficit. Gait normal.  Skin: Skin is dry. She is not diaphoretic.  Psychiatric: She has a normal mood and affect.  Vitals reviewed.   Results for orders placed or performed during the hospital encounter of 12/13/17 (from the past 72 hour(s))  POCT urinalysis dip (device)     Status: Abnormal   Collection Time: 12/13/17  1:52 PM  Result Value Ref Range   Glucose, UA NEGATIVE NEGATIVE mg/dL   Bilirubin Urine NEGATIVE NEGATIVE   Ketones, ur TRACE (A) NEGATIVE mg/dL   Specific Gravity, Urine 1.010 1.005 - 1.030   Hgb urine dipstick NEGATIVE NEGATIVE   pH >=9.0 5.0 - 8.0   Protein, ur 30 (A) NEGATIVE mg/dL   Urobilinogen, UA 2.0 (H) 0.0 - 1.0 mg/dL   Nitrite NEGATIVE NEGATIVE    Leukocytes, UA TRACE (A) NEGATIVE    Comment: Biochemical Testing Only. Please order routine urinalysis from main lab if confirmatory testing is needed.    No results found.  ASSESSMENT AND PLAN:  Left flank tenderness: Leukes and protein on dip.  Lungs clear and the remainder of her exam is normal. She did take excedrine today that may be masking signs of renal infection.  Will cover for pyelo. RTC as needed.  ED precautions discussed.      Discharge Instructions     Start the antibiotics today.  I will culture your urine.  Continue to take your exedrine and drink lots of fluids.  Come back if you need anything.        The patient is advised to call or return to clinic if she does not see an improvement in symptoms, or to seek the care of the closest emergency department if she worsens with the above plan.   Deliah Boston, MHS, PA-C 12/13/2017 2:23 PM   Ofilia Neas, PA-C 12/13/17 1425

## 2017-12-13 NOTE — ED Triage Notes (Signed)
Left lower back/flank pain.  Symptoms started Friday.  2 weeks ago suspected she had a uti.  Drank water and cranberry juice and though things improved.  But since Friday, flank pain, headache, chills.

## 2017-12-13 NOTE — Discharge Instructions (Addendum)
Start the antibiotics today.  I will culture your urine.  Continue to take your exedrine and drink lots of fluids.  Come back if you need anything.

## 2018-03-18 ENCOUNTER — Ambulatory Visit (HOSPITAL_COMMUNITY)
Admission: EM | Admit: 2018-03-18 | Discharge: 2018-03-18 | Disposition: A | Discharge status: Home / Self Care | Payer: BLUE CROSS/BLUE SHIELD | Attending: Emergency Medicine | Admitting: Emergency Medicine

## 2018-03-18 ENCOUNTER — Encounter (HOSPITAL_COMMUNITY): Payer: Self-pay

## 2018-03-18 ENCOUNTER — Other Ambulatory Visit: Payer: Self-pay

## 2018-03-18 DIAGNOSIS — L0231 Cutaneous abscess of buttock: Secondary | ICD-10-CM

## 2018-03-18 MED ORDER — NAPROXEN 375 MG PO TABS: 375.0000 mg | ORAL_TABLET | Freq: Two times a day (BID) | ORAL | 0 refills | Status: DC

## 2018-03-18 MED ORDER — DOXYCYCLINE HYCLATE 100 MG PO CAPS: 100.0000 mg | ORAL_CAPSULE | Freq: Two times a day (BID) | ORAL | 0 refills | Status: DC

## 2018-03-18 NOTE — ED Provider Notes (Signed)
[facility]   [id number] 03/18/18 Arrival Time: 0801   WG:[id number]  SUBJECTIVE:  Diane Wilson is a 36 y.o. female who presents with a possible abscess of her left buttocks. Onset abrupt, approximately 6 days ago. Denies a precipitating event or injury.  States she was sitting on the ground when her symptoms began.  Describes the pain as constant.  Has tried alcohol, epsom soak baths, and neosporin without relief.  Worse with sitting.  Denies previous symptoms in the past.  Denies fever, chills, nausea, vomiting, abdominal pain, chest pain, SOB, erythema, or drainage.    ROS: As per HPI.  Past Medical History:  Diagnosis Date  . Frequent headaches   . Mitral valve prolapse    Past Surgical History:  Procedure Laterality Date  . CESAREAN SECTION    . TUBAL LIGATION     Allergies  Allergen Reactions  . Codeine Nausea And Vomiting  . Oxycontin [Oxycodone Hcl] Nausea And Vomiting   No current facility-administered medications on file prior to encounter.    Current Outpatient Medications on File Prior to Encounter  Medication Sig Dispense Refill  . amoxicillin-clavulanate (AUGMENTIN) 875-125 MG tablet Take 1 tablet by mouth every 12 (twelve) hours. (Patient not taking: Reported on 03/18/2018) 20 tablet 0   Social History   Socioeconomic History  . Marital status: Single    Spouse name: Not on file  . Number of children: 2  . Years of education: Not on file  . 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 Financial resource strain: Not on file  . Food insecurity:    Worry: Not on file    Inability: Not on file  . Transportation needs:    Medical: Not on file    Non-medical: Not on file  Tobacco Use  . Smoking status: Never Smoker  . Smokeless tobacco: Never Used  Substance and Sexual Activity  . Alcohol use: No    Alcohol/week: 0.0 standard drinks  . Drug use: No  .  Sexual activity: Yes    Partners: Male    Birth control/protection: None, IUD    Comment: BTL  Lifestyle  . Physical activity:    Days per week: Not on file    Minutes per session: Not on file  . Stress: Not on file  Relationships  . Social connections:    Talks on phone: Not on file    Gets together: Not on file    Attends religious service: Not on file    Active member of club or organization: Not on file    Attends meetings of clubs or organizations: Not on file    Relationship status: Not on file  . Intimate partner violence:    Fear of current or ex partner: Not on file    Emotionally abused: Not on file    Physically abused: Not on file    Forced sexual activity: Not on file  Other Topics Concern  . Not on file  Social History Narrative  . Not on file   Family History  Problem Relation Age of Onset  . Cancer Father   . Diabetes Unknown   . Hypertension Unknown   . Diabetes Maternal Aunt   . Diabetes Maternal Uncle   . Diabetes Maternal Grandmother   . Diabetes Maternal Grandfather   . Diabetes Cousin     OBJECTIVE:  Vitals:  03/18/18 0826 03/18/18 0841  BP: 138/84   Pulse: 85   Resp: 16   Temp: 99.4 F (37.4 C)   TempSrc: Oral   SpO2: 99%   Weight:  185 lb (83.9 kg)     General appearance: alert; no distress Skin: 3 x 4 cm area of induration localized to her left buttocks; tender to touch; no active drainage Psychological: alert and cooperative; normal mood and affect  Procedure: Verbal consent obtained. Area over induration cleaned with betadine. Lidocaine 2% without epinephrine used to obtain local anesthesia. The center of the induration portion of the abscess was incised with a #11 blade scalpel. Purulent drainage expressed from abscess.  Abscess cavity explored and evacuated. Loculations broken up with a curved hemostat as best as possible given patient discomfort. Cavity packed with packing material and dressed with a clean gauze dressing.  Minimal bleeding. No complications.  ASSESSMENT & PLAN:  1. Abscess of left buttock     Meds ordered this encounter  Medications  . doxycycline (VIBRAMYCIN) 100 MG capsule    Sig: Take 1 capsule (100 mg total) by mouth 2 (two) times daily.    Dispense:  20 capsule    Refill:  0    Order Specific Question:   Supervising Provider    Answer:   Isa RankinMURRAY, LAURA WILSON [phone][[id number]]  . naproxen (NAPROSYN) 375 MG tablet    Sig: Take 1 tablet (375 mg total) by mouth 2 (two) times daily.    Dispense:  20 tablet    Refill:  0    Order Specific Question:   Supervising Provider    Answer:   Isa RankinMURRAY, LAURA WILSON [[id number]]   Incision and drainage performed.  Dressing applied Avoid submerging in water Keep covered and dry for the next 24-48 hours if possible Change dressing daily Take antibiotic as prescribed and to completion Naproxen prescribed.  Use as needed for pain.   Follow up here in 2 days to have wound recheck and packing removed Return sooner or go to the ED if you have any new or worsening symptoms such as increased pain, increased swelling, redness, fever, chills, nausea, vomiting, etc...  Reviewed expectations re: course of current medical issues. Questions answered. Outlined signs and symptoms indicating need for more acute intervention. Patient verbalized understanding. After Visit Summary given.          Rennis HardingWurst, Angelica Wix, PA-C 03/18/18 1009

## 2018-03-18 NOTE — ED Triage Notes (Signed)
Pt has a boil on her left buttock. X 5 days

## 2018-03-18 NOTE — Discharge Instructions (Addendum)
Incision and drainage performed.  Dressing applied Avoid submerging in water Keep covered and dry for the next 24-48 hours if possible Change dressing daily Take antibiotic as prescribed and to completion Naproxen prescribed.  Use as needed for pain.   Follow up here in 2 days to have wound recheck and packing removed Return sooner or go to the ED if you have any new or worsening symptoms such as increased pain, increased swelling, redness, fever, chills, nausea, vomiting, etc...Marland Kitchen

## 2018-03-20 ENCOUNTER — Ambulatory Visit (HOSPITAL_COMMUNITY)
Admission: EM | Admit: 2018-03-20 | Discharge: 2018-03-20 | Disposition: A | Discharge status: Home / Self Care | Payer: BLUE CROSS/BLUE SHIELD | Attending: Internal Medicine | Admitting: Internal Medicine

## 2018-03-20 ENCOUNTER — Encounter (HOSPITAL_COMMUNITY): Payer: Self-pay | Admitting: *Deleted

## 2018-03-20 DIAGNOSIS — Z4800 Encounter for change or removal of nonsurgical wound dressing: Secondary | ICD-10-CM

## 2018-03-20 DIAGNOSIS — L0231 Cutaneous abscess of buttock: Secondary | ICD-10-CM

## 2018-03-20 DIAGNOSIS — Z5189 Encounter for other specified aftercare: Secondary | ICD-10-CM

## 2018-03-20 NOTE — Discharge Instructions (Signed)
Wash daily with soap and water. Warm compresses or soaks to promote further drainage.  Continue with pain medication and antibiotics as prescribed.  I would expect continued gradual improvement and healing over the next 2 weeks.  If any worsening of symptoms please return to be seen or see your PCP.

## 2018-03-20 NOTE — ED Provider Notes (Signed)
[facility]    CSN: [id number] Arrival date & time: 03/20/18  1232     History   Chief Complaint Chief Complaint  Patient presents with  . Wound Check    HPI Diane Wilson is a 37 y.o. female.   Diane Wilson presents for wound follow up. Had buttock I&d two days ago with packing. Has been taking antibiotics as well as naproxen. She feels pain has significantly improved. Pain 2/10. Still with some drainage. Has not been using warm compresses. Denies any previous similar. No fevers. Without contributing medical history.     ROS per HPI.      Past Medical History:  Diagnosis Date  . Frequent headaches   . Mitral valve prolapse     Patient Active Problem List   Diagnosis Date Noted  . IUD check up 11/02/2015  . Prediabetes 11/02/2015  . Neck pain on left side 03/29/2015  . Mitral valve prolapse 12/18/2011    Past Surgical History:  Procedure Laterality Date  . CESAREAN SECTION    . TUBAL LIGATION      OB History    Gravida  2   Para  2   Term  2   Preterm      AB      Living  2     SAB      TAB      Ectopic      Multiple      Live Births  2            Home Medications    Prior to Admission medications   Medication Sig Start Date End Date Taking? Authorizing Provider  amoxicillin-clavulanate (AUGMENTIN) 875-125 MG tablet Take 1 tablet by mouth every 12 (twelve) hours. Patient not taking: Reported on 03/18/2018 12/13/17   Ofilia Neas, PA-C  doxycycline (VIBRAMYCIN) 100 MG capsule Take 1 capsule (100 mg total) by mouth 2 (two) times daily. 03/18/18   Wurst, [place], PA-C  naproxen (NAPROSYN) 375 MG tablet Take 1 tablet (375 mg total) by mouth 2 (two) times daily. 03/18/18   Rennis Harding, PA-C    Family History Family History  Problem Relation Age of Onset  . Cancer Father   . Diabetes Unknown   . Hypertension Unknown   . Diabetes Maternal Aunt   . Diabetes Maternal Uncle   . Diabetes Maternal Grandmother   . Diabetes  Maternal Grandfather   . Diabetes Cousin     Social History Social History   Tobacco Use  . Smoking status: Never Smoker  . Smokeless tobacco: Never Used  Substance Use Topics  . Alcohol use: No    Alcohol/week: 0.0 standard drinks  . Drug use: No     Allergies   Codeine and Oxycontin [oxycodone hcl]   Review of Systems Review of Systems   Physical Exam Triage Vital Signs ED Triage Vitals  Enc Vitals Group     BP 03/20/18 1336 134/85     Pulse Rate 03/20/18 1336 75     Resp 03/20/18 1336 16     Temp 03/20/18 1336 98.3 F (36.8 C)     Temp Source 03/20/18 1336 Oral     SpO2 03/20/18 1336 100 %     Weight --      Height --      Head Circumference --      Peak Flow --      Pain Score 03/20/18 1337 1     Pain Loc --  Pain Edu? --      Excl. in GC? --    No data found.  Updated Vital Signs BP 134/85   Pulse 75   Temp 98.3 F (36.8 C) (Oral)   Resp 16   LMP 03/07/2018   SpO2 100%   Visual Acuity Right Eye Distance:   Left Eye Distance:   Bilateral Distance:    Right Eye Near:   Left Eye Near:    Bilateral Near:     Physical Exam  Constitutional: She is oriented to person, place, and time. She appears well-developed and well-nourished. No distress.  Cardiovascular: Normal rate and regular rhythm.  Pulmonary/Chest: Effort normal and breath sounds normal.  Neurological: She is alert and oriented to person, place, and time.  Skin: Skin is warm and dry.     Packing removed from left buttock; wound remains open, approximately 1 cm in diameter and appears to be 1 cm deep; still with purulent drainage noted; limited redness surrounding and no induration or further fluctuance.      UC Treatments / Results  Labs (all labs ordered are listed, but only abnormal results are displayed) Labs Reviewed - No data to display  EKG None  Radiology No results found.  Procedures Procedures (including critical care time)  Medications Ordered in  UC Medications - No data to display  Initial Impression / Assessment and Plan / UC Course  I have reviewed the triage vital signs and the nursing notes.  Pertinent labs & imaging results that were available during my care of the patient were reviewed by me and considered in my medical decision making (see chart for details).     Symptoms improving, wound still draining. Continue with warm compresses, naproxen and antibiotics. Return precautions provided. Patient verbalized understanding and agreeable to plan.    Final Clinical Impressions(s) / UC Diagnoses   Final diagnoses:  Wound check, abscess     Discharge Instructions     Wash daily with soap and water. Warm compresses or soaks to promote further drainage.  Continue with pain medication and antibiotics as prescribed.  I would expect continued gradual improvement and healing over the next 2 weeks.  If any worsening of symptoms please return to be seen or see your PCP.    ED Prescriptions    None     Controlled Substance Prescriptions [place] Controlled Substance Registry consulted? Not Applicable   Georgetta HaberBurky, Natalie B, NP 03/20/18 1422

## 2018-03-20 NOTE — ED Triage Notes (Signed)
Reports for f/u abscess left buttock.  States feeling much better; packing in place per pt.

## 2018-05-19 ENCOUNTER — Encounter: Payer: Self-pay | Admitting: Obstetrics

## 2018-05-19 ENCOUNTER — Ambulatory Visit (INDEPENDENT_AMBULATORY_CARE_PROVIDER_SITE_OTHER): Payer: BLUE CROSS/BLUE SHIELD | Admitting: Obstetrics

## 2018-05-19 ENCOUNTER — Other Ambulatory Visit: Payer: Self-pay

## 2018-05-19 VITALS — BP 121/72 | HR 83 | Wt 202.6 lb

## 2018-05-19 DIAGNOSIS — B373 Candidiasis of vulva and vagina: Secondary | ICD-10-CM | POA: Diagnosis not present

## 2018-05-19 DIAGNOSIS — Z113 Encounter for screening for infections with a predominantly sexual mode of transmission: Secondary | ICD-10-CM | POA: Diagnosis not present

## 2018-05-19 DIAGNOSIS — Z01419 Encounter for gynecological examination (general) (routine) without abnormal findings: Secondary | ICD-10-CM

## 2018-05-19 DIAGNOSIS — Z124 Encounter for screening for malignant neoplasm of cervix: Secondary | ICD-10-CM | POA: Diagnosis not present

## 2018-05-19 DIAGNOSIS — Z1151 Encounter for screening for human papillomavirus (HPV): Secondary | ICD-10-CM

## 2018-05-19 DIAGNOSIS — N760 Acute vaginitis: Secondary | ICD-10-CM

## 2018-05-19 DIAGNOSIS — N898 Other specified noninflammatory disorders of vagina: Secondary | ICD-10-CM | POA: Diagnosis not present

## 2018-05-19 DIAGNOSIS — B9689 Other specified bacterial agents as the cause of diseases classified elsewhere: Secondary | ICD-10-CM | POA: Diagnosis not present

## 2018-05-19 NOTE — Progress Notes (Signed)
Last PAP: 11/17/16 - HPV detected Influenza vaccine ?

## 2018-05-19 NOTE — Progress Notes (Signed)
Presents for AEX/PAP, wants STD testing. 

## 2018-05-19 NOTE — Progress Notes (Signed)
Subjective:        Diane Wilson is a 37 y.o. female here for a routine exam.  Current complaints: None.    Personal health questionnaire:  Is patient Ashkenazi Jewish, have a family history of breast and/or ovarian cancer: no Is there a family history of uterine cancer diagnosed at age < 25, gastrointestinal cancer, urinary tract cancer, family member who is a Personnel officer syndrome-associated carrier: no Is the patient overweight and hypertensive, family history of diabetes, personal history of gestational diabetes, preeclampsia or PCOS: no Is patient over 54, have PCOS,  family history of premature CHD under age 60, diabetes, smoke, have hypertension or peripheral artery disease:  no At any time, has a partner hit, kicked or otherwise hurt or frightened you?: no Over the past 2 weeks, have you felt down, depressed or hopeless?: no Over the past 2 weeks, have you felt little interest or pleasure in doing things?:no   Gynecologic History Patient's last menstrual period was 05/04/2018 (exact date). Contraception: tubal ligation Last Pap: 2018. Results were: normal Last mammogram: n/a. Results were: n/a  Obstetric History OB History  Gravida Para Term Preterm AB Living  2 2 2     2   SAB TAB Ectopic Multiple Live Births          2    # Outcome Date GA Lbr Len/2nd Weight Sex Delivery Anes PTL Lv  2 Term 03/26/03 [redacted]w[redacted]d  7 lb 8 oz (3.402 kg) M CS-LTranv EPI  LIV  1 Term 10/17/01 [redacted]w[redacted]d  8 lb 5 oz (3.771 kg) M CS-LTranv EPI  LIV    Past Medical History:  Diagnosis Date  . Frequent headaches   . Mitral valve prolapse     Past Surgical History:  Procedure Laterality Date  . CESAREAN SECTION    . TUBAL LIGATION       Current Outpatient Medications:  .  amoxicillin-clavulanate (AUGMENTIN) 875-125 MG tablet, Take 1 tablet by mouth every 12 (twelve) hours. (Patient not taking: Reported on 03/18/2018), Disp: 20 tablet, Rfl: 0 .  doxycycline (VIBRAMYCIN) 100 MG capsule, Take 1 capsule  (100 mg total) by mouth 2 (two) times daily. (Patient not taking: Reported on 05/19/2018), Disp: 20 capsule, Rfl: 0 .  naproxen (NAPROSYN) 375 MG tablet, Take 1 tablet (375 mg total) by mouth 2 (two) times daily. (Patient not taking: Reported on 05/19/2018), Disp: 20 tablet, Rfl: 0 Allergies  Allergen Reactions  . Codeine Nausea And Vomiting  . Oxycontin [Oxycodone Hcl] Nausea And Vomiting    Social History   Tobacco Use  . Smoking status: Never Smoker  . Smokeless tobacco: Never Used  Substance Use Topics  . Alcohol use: No    Alcohol/week: 0.0 standard drinks    Family History  Problem Relation Age of Onset  . Cancer Father   . Diabetes Unknown   . Hypertension Unknown   . Diabetes Maternal Aunt   . Diabetes Maternal Uncle   . Diabetes Maternal Grandmother   . Diabetes Maternal Grandfather   . Diabetes Cousin       Review of Systems  Constitutional: negative for fatigue and weight loss Respiratory: negative for cough and wheezing Cardiovascular: negative for chest pain, fatigue and palpitations Gastrointestinal: negative for abdominal pain and change in bowel habits Musculoskeletal:negative for myalgias Neurological: negative for gait problems and tremors Behavioral/Psych: negative for abusive relationship, depression Endocrine: negative for temperature intolerance    Genitourinary:negative for abnormal menstrual periods, genital lesions, hot flashes, sexual problems and vaginal  discharge Integument/breast: negative for breast lump, breast tenderness, nipple discharge and skin lesion(s)    Objective:       BP 121/72   Pulse 83   Wt 202 lb 9.6 oz (91.9 kg)   LMP 05/04/2018 (Exact Date)   BMI 37.06 kg/m  General:   alert  Skin:   no rash or abnormalities  Lungs:   clear to auscultation bilaterally  Heart:   regular rate and rhythm, S1, S2 normal, no murmur, click, rub or gallop  Breasts:   normal without suspicious masses, skin or nipple changes or axillary  nodes  Abdomen:  normal findings: no organomegaly, soft, non-tender and no hernia  Pelvis:  External genitalia: normal general appearance Urinary system: urethral meatus normal and bladder without fullness, nontender Vaginal: normal without tenderness, induration or masses Cervix: normal appearance Adnexa: normal bimanual exam Uterus: anteverted and non-tender, normal size   Lab Review Urine pregnancy test Labs reviewed yes Radiologic studies reviewed no  50% of 20 min visit spent on counseling and coordination of care.   Assessment:     1. Encounter for routine gynecological examination with Papanicolaou smear of cervix Rx: - Cytology - PAP( [place])  2. Vaginal discharge Rx: - Cervicovaginal ancillary only( [place])  3. Screening for STD (sexually transmitted disease) Rx: - HIV Antibody (routine testing w rflx) - Hepatitis B surface antigen - RPR - Hepatitis C antibody    Plan:    Education reviewed: calcium supplements, depression evaluation, low fat, low cholesterol diet, safe sex/STD prevention, self breast exams and weight bearing exercise. Follow up in: 1 year.   No orders of the defined types were placed in this encounter.  Orders Placed This Encounter  Procedures  . HIV Antibody (routine testing w rflx)  . Hepatitis B surface antigen  . RPR  . Hepatitis C antibody    Brock Bad MD 05-19-2018

## 2018-05-20 LAB — HEPATITIS B SURFACE ANTIGEN: HEP B S AG: NEGATIVE

## 2018-05-20 LAB — HEPATITIS C ANTIBODY

## 2018-05-20 LAB — RPR: RPR: NONREACTIVE

## 2018-05-20 LAB — HIV ANTIBODY (ROUTINE TESTING W REFLEX): HIV SCREEN 4TH GENERATION: NONREACTIVE

## 2018-05-21 ENCOUNTER — Other Ambulatory Visit: Payer: Self-pay | Admitting: Obstetrics

## 2018-05-21 DIAGNOSIS — B9689 Other specified bacterial agents as the cause of diseases classified elsewhere: Secondary | ICD-10-CM

## 2018-05-21 DIAGNOSIS — N760 Acute vaginitis: Secondary | ICD-10-CM

## 2018-05-21 DIAGNOSIS — B3731 Acute candidiasis of vulva and vagina: Secondary | ICD-10-CM

## 2018-05-21 DIAGNOSIS — B373 Candidiasis of vulva and vagina: Secondary | ICD-10-CM

## 2018-05-21 LAB — CERVICOVAGINAL ANCILLARY ONLY
Bacterial vaginitis: POSITIVE — AB
CHLAMYDIA, DNA PROBE: NEGATIVE
Candida vaginitis: POSITIVE — AB
NEISSERIA GONORRHEA: NEGATIVE
TRICH (WINDOWPATH): NEGATIVE

## 2018-05-21 MED ORDER — FLUCONAZOLE 150 MG PO TABS: 150.0000 mg | ORAL_TABLET | Freq: Once | ORAL | 0 refills | Status: AC

## 2018-05-21 MED ORDER — TINIDAZOLE 500 MG PO TABS: 1000.0000 mg | ORAL_TABLET | Freq: Every day | ORAL | 2 refills | Status: DC

## 2018-05-24 LAB — CYTOLOGY - PAP
DIAGNOSIS: NEGATIVE
HPV: NOT DETECTED

## 2018-06-19 ENCOUNTER — Encounter (HOSPITAL_COMMUNITY): Payer: Self-pay | Admitting: Emergency Medicine

## 2018-06-19 ENCOUNTER — Ambulatory Visit (HOSPITAL_COMMUNITY)
Admission: EM | Admit: 2018-06-19 | Discharge: 2018-06-19 | Disposition: A | Discharge status: Home / Self Care | Payer: BLUE CROSS/BLUE SHIELD | Attending: Family Medicine | Admitting: Family Medicine

## 2018-06-19 ENCOUNTER — Other Ambulatory Visit: Payer: Self-pay

## 2018-06-19 DIAGNOSIS — L03115 Cellulitis of right lower limb: Secondary | ICD-10-CM

## 2018-06-19 MED ORDER — SULFAMETHOXAZOLE-TRIMETHOPRIM 800-160 MG PO TABS: 1.0000 | ORAL_TABLET | Freq: Two times a day (BID) | ORAL | 0 refills | Status: AC

## 2018-06-19 NOTE — ED Triage Notes (Signed)
Noticed leg soreness yesterday morning.  As the day progressed, right thigh was more sore.  Last night pulled a hair out of bump and pus came out.  Today area is worse, increased redness, swelling and pain

## 2018-06-21 NOTE — ED Provider Notes (Signed)
[facility]   [id number] 06/19/18 Arrival Time: 1135  ASSESSMENT & PLAN:  1. Cellulitis of right lower extremity   No sign of abscess requiring I&D. Discussed.  Will begin: Meds ordered this encounter  Medications  . sulfamethoxazole-trimethoprim (BACTRIM DS,SEPTRA DS) 800-160 MG tablet    Sig: Take 1 tablet by mouth 2 (two) times daily for 10 days.    Dispense:  20 tablet    Refill:  0   Monitor closely. Outline edges with pen.  Follow-up Information    [facility]   Specialty:  Urgent Care Why:  Please see your primary care doctor or return here if not seeing improvement over the next 24-48 hours. Contact information: [address] [place] [address] [phone]         Reviewed expectations re: course of current medical issues. Questions answered. Outlined signs and symptoms indicating need for more acute intervention. Patient verbalized understanding. After Visit Summary given.   SUBJECTIVE:  Diane Wilson is a 37 y.o. female who presents with a skin complaint. Area of warmth and redness. No injury/skin trauma. "Last night I pulled a hair out and a little pus came out." No bleeding.  Location: R upper anterior thigh Onset: abrupt Duration: 24-48 hours Associated pruritis? none Associated pain? none Progression: increasing steadily  Drainage? No  Known trigger? No  New soaps/lotions/topicals/detergents/environmental exposures? No Contacts with similar? No Recent travel? No  Other associated symptoms: none Therapies tried thus far: none Arthralgia or myalgia? none Recent illness? none Fever? Unsure; some chills at times. No specific aggravating or alleviating factors reported.  ROS: As per HPI. All other systems negative.  OBJECTIVE: Vitals:   06/19/18 1242  BP: 115/77  Pulse: 65  Resp: 18  Temp: 97.8 F (36.6 C)  TempSrc: Oral  SpO2: 100%    General appearance: alert; no  distress Lungs: clear to auscultation bilaterally Heart: regular rate and rhythm Extremities: no edema Skin: warm and dry; R upper anterior thigh with approx 3x3 cm area of erythema; hot to touch; no induration or fluctuance; tender to touch Psychological: alert and cooperative; normal mood and affect  Allergies  Allergen Reactions  . Codeine Nausea And Vomiting  . Oxycontin [Oxycodone Hcl] Nausea And Vomiting    Past Medical History:  Diagnosis Date  . Frequent headaches   . Mitral valve prolapse    Social History   Socioeconomic History  . Marital status: Single    Spouse name: Not on file  . Number of children: 2  . Years of education: Not on file  . 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 Financial resource strain: Not on file  . Food insecurity:    Worry: Not on file    Inability: Not on file  . Transportation needs:    Medical: Not on file    Non-medical: Not on file  Tobacco Use  . Smoking status: Never Smoker  . Smokeless tobacco: Never Used  Substance and Sexual Activity  . Alcohol use: No    Alcohol/week: 0.0 standard drinks  . Drug use: No  . Sexual activity: Yes    Partners: Male    Birth control/protection: None, IUD    Comment: BTL  Lifestyle  . Physical activity:    Days per week: Not on file    Minutes per session: Not on file  .  Stress: Not on file  Relationships  . Social connections:    Talks on phone: Not on file    Gets together: Not on file    Attends religious service: Not on file    Active member of club or organization: Not on file    Attends meetings of clubs or organizations: Not on file    Relationship status: Not on file  . Intimate partner violence:    Fear of current or ex partner: Not on file    Emotionally abused: Not on file    Physically abused: Not on file    Forced sexual activity: Not on file  Other Topics Concern  . Not on  file  Social History Narrative  . Not on file   Family History  Problem Relation Age of Onset  . Cancer Father   . Diabetes Unknown   . Hypertension Unknown   . Diabetes Maternal Aunt   . Diabetes Maternal Uncle   . Diabetes Maternal Grandmother   . Diabetes Maternal Grandfather   . Diabetes Cousin    Past Surgical History:  Procedure Laterality Date  . CESAREAN SECTION    . TUBAL LIGATION       Mardella LaymanHagler, Terina Mcelhinny, MD 06/21/18 1409

## 2019-03-14 ENCOUNTER — Other Ambulatory Visit: Payer: Self-pay

## 2019-03-14 DIAGNOSIS — Z20822 Contact with and (suspected) exposure to covid-19: Secondary | ICD-10-CM

## 2019-03-16 LAB — NOVEL CORONAVIRUS, NAA: SARS-CoV-2, NAA: NOT DETECTED

## 2019-09-03 ENCOUNTER — Other Ambulatory Visit: Payer: Self-pay

## 2019-09-03 ENCOUNTER — Ambulatory Visit (HOSPITAL_COMMUNITY)
Admission: EM | Admit: 2019-09-03 | Discharge: 2019-09-03 | Disposition: A | Discharge status: Home / Self Care | Payer: BLUE CROSS/BLUE SHIELD | Attending: Emergency Medicine | Admitting: Emergency Medicine

## 2019-09-03 ENCOUNTER — Encounter (HOSPITAL_COMMUNITY): Payer: Self-pay | Admitting: *Deleted

## 2019-09-03 DIAGNOSIS — M25521 Pain in right elbow: Secondary | ICD-10-CM | POA: Diagnosis not present

## 2019-09-03 MED ORDER — IBUPROFEN 600 MG PO TABS: 600.0000 mg | ORAL_TABLET | Freq: Four times a day (QID) | ORAL | 0 refills | Status: DC | PRN

## 2019-09-03 MED ORDER — PREDNISONE 10 MG (21) PO TBPK: ORAL_TABLET | ORAL | 0 refills | Status: DC

## 2019-09-03 NOTE — ED Provider Notes (Signed)
HPI  SUBJECTIVE:  Diane Wilson is a right-handed 39 y.o. female who presents with 6 months of right arm pain.  States it starts at the elbow and radiates above and below.  Is located along the lateral elbow.  She reports numbness in her right hand at night.  She reports pain described as dull, "like a toothache" at night.  She denies pain during the day.  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  States that she has been trying to not lift children up for the past 6 months.  However she does a lot of wrist flexion extension.  No elbow erythema, joint swelling, fevers, body aches, grip weakness.  Shoulder and wrist are without injury.  She has tried rest, Aleve 2 tabs every 4 hours.  She has also tried elevating her elbow on a pillow.  No alleviating factors.  Symptoms are worse with lying on it.  Past medical history negative for diabetes, carpal tunnel syndrome, elbow injury, lateral medial epicondylitis.  LMP: This week.  Denies possibility pregnant.  PMD: [facility] at [facility].  Past Medical History:  Diagnosis Date  . Frequent headaches   . Mitral valve prolapse     Past Surgical History:  Procedure Laterality Date  . CESAREAN SECTION    . TUBAL LIGATION      Family History  Problem Relation Age of Onset  . Cancer Father   . Diabetes Other   . Hypertension Other   . Diabetes Maternal Aunt   . Diabetes Maternal Uncle   . Diabetes Maternal Grandmother   . Diabetes Maternal Grandfather   . Diabetes Cousin     Social History   Tobacco Use  . Smoking status: Never Smoker  . Smokeless tobacco: Never Used  Substance Use Topics  . Alcohol use: No  . Drug use: No    No current facility-administered medications for this encounter.  Current Outpatient Medications:  .  ibuprofen (ADVIL) 600 MG tablet, Take 1 tablet (600 mg total) by mouth every 6 (six) hours as needed., Disp: 30 tablet, Rfl: 0 .  predniSONE (STERAPRED UNI-PAK 21 TAB) 10 MG (21) TBPK tablet,  Dispense one 6 day pack. Take as directed with food., Disp: 21 tablet, Rfl: 0  Allergies  Allergen Reactions  . Codeine Nausea And Vomiting  . Oxycontin [Oxycodone Hcl] Nausea And Vomiting     ROS  As noted in HPI.   Physical Exam  BP 135/78   Pulse 68   Temp 99.2 F (37.3 C) (Oral)   Resp 18   LMP 08/28/2019 (Exact Date)   SpO2 99%   Constitutional: Well developed, well nourished, no acute distress Eyes:  EOMI, conjunctiva normal bilaterally HENT: Normocephalic, atraumatic,mucus membranes moist Respiratory: Normal inspiratory effort Cardiovascular: Normal rate GI: nondistended skin: No rash, skin intact Musculoskeletal: R Elbow ROM Normal for Pt , NT entire joint, Supracondylar region NT , Radial head NT, Olecrenon process NT, Medial epicondyle NT , Lateral epicondyle NT.  No tenderness over the soft tissues of the medial lateral elbow.  Shoulder NT, Wrist NT, Hand NT with RP 2+, radial pulse intact, Sensation LT and Motor grossly intact distally in distribution of radial, median, and ulnar nerve function.  Grip strength 5/5.  Negative Tinel, negative Phalen Neurologic: Alert & oriented x 3, no focal neuro deficits Psychiatric: Speech and behavior appropriate   ED Course   Medications - No data to display  No orders of the defined types were placed in  this encounter.   No results found for this or any previous visit (from the past 24 hour(s)). No results found.  ED Clinical Impression  1. Right elbow pain      ED Assessment/Plan  Suspect symptoms are from overuse.  Does not appear to be lateral epicondylitis because she has no tenderness in this area.  Deferring imaging in the absence of bony tenderness, trauma.  It does not appear to be vascular or infectious.  Doubt carpal tunnel syndrome.  However will try some ibuprofen 600 mg combined with 1 g of Tylenol 3-4 times a day, 6-day prednisone taper, ice at night.  [facility]  in [facility]  week if not better.  Discussed  MDM, treatment plan, and plan for follow-up with patient.  patient agrees with plan.   Meds ordered this encounter  Medications  . ibuprofen (ADVIL) 600 MG tablet    Sig: Take 1 tablet (600 mg total) by mouth every 6 (six) hours as needed.    Dispense:  30 tablet    Refill:  0  . predniSONE (STERAPRED UNI-PAK 21 TAB) 10 MG (21) TBPK tablet    Sig: Dispense one 6 day pack. Take as directed with food.    Dispense:  21 tablet    Refill:  0    *This clinic note was created using [employer]. Therefore, there may be occasional mistakes despite careful proofreading.   ?    Melynda Ripple, MD 09/04/19 [phone]

## 2019-09-03 NOTE — Discharge Instructions (Addendum)
Try ibuprofen 600 mg combined with 1 g of Tylenol 3-4 times a day, 6-day prednisone taper.  Apply ice to your elbow at night.  Follow-up with Dr. Magnus Ivan at [facility]  in 1 week if this does not help.

## 2019-09-03 NOTE — ED Triage Notes (Signed)
Denies any known injury.  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  C/O gradual onset right upper arm pain since approx Aug 2020 with progressive worsening.  States pain is worse at night, and gets some tingling in RUE fingers at times.  Currently RUE CMS intact.  Has been taking Aleve without relief.

## 2019-10-20 ENCOUNTER — Ambulatory Visit: Payer: BLUE CROSS/BLUE SHIELD | Attending: Internal Medicine

## 2019-10-20 DIAGNOSIS — Z23 Encounter for immunization: Secondary | ICD-10-CM

## 2019-10-20 NOTE — Progress Notes (Signed)
   Covid-19 Vaccination Clinic  Name:  Diane Wilson    MRN: [id number] DOB: 1980/11/11  10/20/2019  Diane Wilson was observed post Covid-19 immunization for 15 minutes without incident. She was provided with Vaccine Information Sheet and instruction to access the V-Safe system.   Diane Wilson was instructed to call 911 with any severe reactions post vaccine: Marland Kitchen Difficulty breathing  . Swelling of face and throat  . A fast heartbeat  . A bad rash all over body  . Dizziness and weakness   Immunizations Administered    Name Date Dose VIS Date Route   Pfizer COVID-19 Vaccine 10/20/2019  9:44 AM 0.3 mL 07/08/2019 Intramuscular   Manufacturer: [employer], [employer]   Lot: [id number]   NDC: [address]-6

## 2019-11-14 ENCOUNTER — Ambulatory Visit: Payer: BLUE CROSS/BLUE SHIELD | Attending: Internal Medicine

## 2019-11-14 DIAGNOSIS — Z23 Encounter for immunization: Secondary | ICD-10-CM

## 2019-11-14 NOTE — Progress Notes (Signed)
   Covid-19 Vaccination Clinic  Name:  Diane Wilson    MRN: [id number] DOB: 11/04/1980  11/14/2019  [name] was observed post Covid-19 immunization for 15 minutes without incident. She was provided with Vaccine Information Sheet and instruction to access the V-Safe system.   [name] was instructed to call 911 with any severe reactions post vaccine: Marland Kitchen Difficulty breathing  . Swelling of face and throat  . A fast heartbeat  . A bad rash all over body  . Dizziness and weakness   Immunizations Administered    Name Date Dose VIS Date Route   Pfizer COVID-19 Vaccine 11/14/2019  5:08 PM 0.3 mL 09/21/2018 Intramuscular   Manufacturer: [employer], [employer]   Lot: [id number]   NDC: [address]-4

## 2019-11-17 ENCOUNTER — Encounter: Payer: Self-pay | Admitting: Obstetrics

## 2019-11-17 ENCOUNTER — Ambulatory Visit (INDEPENDENT_AMBULATORY_CARE_PROVIDER_SITE_OTHER): Payer: BLUE CROSS/BLUE SHIELD | Admitting: Obstetrics

## 2019-11-17 ENCOUNTER — Other Ambulatory Visit: Payer: Self-pay

## 2019-11-17 VITALS — BP 136/82 | HR 82 | Ht 61.0 in | Wt 218.0 lb

## 2019-11-17 DIAGNOSIS — Z01419 Encounter for gynecological examination (general) (routine) without abnormal findings: Secondary | ICD-10-CM

## 2019-11-17 DIAGNOSIS — N898 Other specified noninflammatory disorders of vagina: Secondary | ICD-10-CM | POA: Diagnosis not present

## 2019-11-17 DIAGNOSIS — Z1239 Encounter for other screening for malignant neoplasm of breast: Secondary | ICD-10-CM | POA: Diagnosis not present

## 2019-11-17 DIAGNOSIS — Z9851 Tubal ligation status: Secondary | ICD-10-CM

## 2019-11-17 DIAGNOSIS — Z113 Encounter for screening for infections with a predominantly sexual mode of transmission: Secondary | ICD-10-CM

## 2019-11-17 DIAGNOSIS — Z6841 Body Mass Index (BMI) 40.0 and over, adult: Secondary | ICD-10-CM

## 2019-11-17 NOTE — Progress Notes (Signed)
Pt is in the office for annual. Last pap 05-19-18 Needs mammogram GAD-7= 0 Pt has IUD

## 2019-11-17 NOTE — Progress Notes (Signed)
Subjective:        Diane Wilson is a 39 y.o. female here for a routine exam.  Current complaints: None.    Personal health questionnaire:  Is patient Ashkenazi Jewish, have a family history of breast and/or ovarian cancer: no Is there a family history of uterine cancer diagnosed at age < 45, gastrointestinal cancer, urinary tract cancer, family member who is a Field seismologist syndrome-associated carrier: no Is the patient overweight and hypertensive, family history of diabetes, personal history of gestational diabetes, preeclampsia or PCOS: yes Is patient over 9, have PCOS,  family history of premature CHD under age 65, diabetes, smoke, have hypertension or peripheral artery disease:  no At any time, has a partner hit, kicked or otherwise hurt or frightened you?: no Over the past 2 weeks, have you felt down, depressed or hopeless?: no Over the past 2 weeks, have you felt little interest or pleasure in doing things?:no   Gynecologic History Patient's last menstrual period was 10/20/2019. Contraception: tubal ligation Last Pap: 05-19-2018. Results were: normal Last mammogram: none. Results were: none  Obstetric History OB History  Gravida Para Term Preterm AB Living  2 2 2     2   SAB TAB Ectopic Multiple Live Births          2    # Outcome Date GA Lbr Len/2nd Weight Sex Delivery Anes PTL Lv  2 Term 03/26/03 [redacted]w[redacted]d  7 lb 8 oz (3.402 kg) M CS-LTranv EPI  LIV  1 Term 10/17/01 [redacted]w[redacted]d  8 lb 5 oz (3.771 kg) M CS-LTranv EPI  LIV    Past Medical History:  Diagnosis Date  . Frequent headaches   . Mitral valve prolapse     Past Surgical History:  Procedure Laterality Date  . CESAREAN SECTION    . TUBAL LIGATION       Current Outpatient Medications:  .  ibuprofen (ADVIL) 600 MG tablet, Take 1 tablet (600 mg total) by mouth every 6 (six) hours as needed. (Patient not taking: Reported on 11/17/2019), Disp: 30 tablet, Rfl: 0 .  predniSONE (STERAPRED UNI-PAK 21 TAB) 10 MG (21) TBPK tablet,  Dispense one 6 day pack. Take as directed with food. (Patient not taking: Reported on 11/17/2019), Disp: 21 tablet, Rfl: 0 Allergies  Allergen Reactions  . Codeine Nausea And Vomiting  . Oxycontin [Oxycodone Hcl] Nausea And Vomiting    Social History   Tobacco Use  . Smoking status: Never Smoker  . Smokeless tobacco: Never Used  Substance Use Topics  . Alcohol use: No    Family History  Problem Relation Age of Onset  . Cancer Father   . Diabetes Other   . Hypertension Other   . Diabetes Maternal Aunt   . Diabetes Maternal Uncle   . Diabetes Maternal Grandmother   . Diabetes Maternal Grandfather   . Diabetes Cousin       Review of Systems  Constitutional: negative for fatigue and weight loss Respiratory: negative for cough and wheezing Cardiovascular: negative for chest pain, fatigue and palpitations Gastrointestinal: negative for abdominal pain and change in bowel habits Musculoskeletal:negative for myalgias Neurological: negative for gait problems and tremors Behavioral/Psych: negative for abusive relationship, depression Endocrine: negative for temperature intolerance    Genitourinary:negative for abnormal menstrual periods, genital lesions, hot flashes, sexual problems and vaginal discharge Integument/breast: negative for breast lump, breast tenderness, nipple discharge and skin lesion(s)    Objective:       BP 136/82   Pulse 82  Ht 5\' 1"  (1.549 m)   Wt 218 lb (98.9 kg)   LMP 10/20/2019   BMI 41.19 kg/m  General:   alert  Skin:   no rash or abnormalities  Lungs:   clear to auscultation bilaterally  Heart:   regular rate and rhythm, S1, S2 normal, no murmur, click, rub or gallop  Breasts:   normal without suspicious masses, skin or nipple changes or axillary nodes  Abdomen:  normal findings: no organomegaly, soft, non-tender and no hernia  Pelvis:  External genitalia: normal general appearance Urinary system: urethral meatus normal and bladder without  fullness, nontender Vaginal: normal without tenderness, induration or masses Cervix: normal appearance Adnexa: normal bimanual exam Uterus: anteverted and non-tender, normal size   Lab Review Urine pregnancy test Labs reviewed yes Radiologic studies reviewed yes  50% of 25 min visit spent on counseling and coordination of care.   Assessment:     1. Encounter for routine gynecological examination with Papanicolaou smear of cervix Rx: - Cytology - PAP( [place]) - CBC with Differential/Platelet - Comprehensive metabolic panel - TSH - Hemoglobin A1c - Ferritin  2. Vaginal discharge Rx: - Cervicovaginal ancillary only( [place])  3. Screen for STD (sexually transmitted disease)  4. Screening breast examination Rx: - MM Digital Screening; Future  5. Class 3 severe obesity due to excess calories without serious comorbidity with body mass index (BMI) of 40.0 to 44.9 in adult [facility]) - program of caloric reduction, exercise and behavioral modification recommended    Plan:    Education reviewed: calcium supplements, depression evaluation, low fat, low cholesterol diet, safe sex/STD prevention, self breast exams and weight bearing exercise. Mammogram ordered. Follow up in: 3 months.   IUD Removal / Reinsertion    Orders Placed This Encounter  Procedures  . MM Digital Screening    Standing Status:   Future    Standing Expiration Date:   01/16/2021    Order Specific Question:   Reason for Exam (SYMPTOM  OR DIAGNOSIS REQUIRED)    Answer:   Screening    Order Specific Question:   Is the patient pregnant?    Answer:   No    Order Specific Question:   Preferred imaging location?    Answer:   [facility]  . CBC with Differential/Platelet  . Comprehensive metabolic panel  . TSH  . Hemoglobin A1c  . Ferritin    [facility], MD 11/17/2019 11:21 AM

## 2019-11-18 LAB — CBC WITH DIFFERENTIAL/PLATELET
Basophils Absolute: 0 10*3/uL (ref 0.0–0.2)
Basos: 1 %
EOS (ABSOLUTE): 0 10*3/uL (ref 0.0–0.4)
Eos: 1 %
Hematocrit: 38.7 % (ref 34.0–46.6)
Hemoglobin: 12.8 g/dL (ref 11.1–15.9)
Immature Grans (Abs): 0 10*3/uL (ref 0.0–0.1)
Immature Granulocytes: 0 %
Lymphocytes Absolute: 1.7 10*3/uL (ref 0.7–3.1)
Lymphs: 51 %
MCH: 28.5 pg (ref 26.6–33.0)
MCHC: 33.1 g/dL (ref 31.5–35.7)
MCV: 86 fL (ref 79–97)
Monocytes Absolute: 0.5 10*3/uL (ref 0.1–0.9)
Monocytes: 16 %
Neutrophils Absolute: 1 10*3/uL — ABNORMAL LOW (ref 1.4–7.0)
Neutrophils: 31 %
Platelets: 331 10*3/uL (ref 150–450)
RBC: 4.49 x10E6/uL (ref 3.77–5.28)
RDW: 13.4 % (ref 11.7–15.4)
WBC: 3.3 10*3/uL — ABNORMAL LOW (ref 3.4–10.8)

## 2019-11-18 LAB — COMPREHENSIVE METABOLIC PANEL
ALT: 11 IU/L (ref 0–32)
AST: 17 IU/L (ref 0–40)
Albumin/Globulin Ratio: 1.8 (ref 1.2–2.2)
Albumin: 4.7 g/dL (ref 3.8–4.8)
Alkaline Phosphatase: 62 IU/L (ref 39–117)
BUN/Creatinine Ratio: 10 (ref 9–23)
BUN: 9 mg/dL (ref 6–24)
Bilirubin Total: <0.2 mg/dL (ref 0.0–1.2)
CO2: 24 mmol/L (ref 20–29)
Calcium: 9.2 mg/dL (ref 8.7–10.2)
Chloride: 100 mmol/L (ref 96–106)
Creatinine, Ser: 0.89 mg/dL (ref 0.57–1.00)
GFR calc Af Amer: 93 mL/min/{1.73_m2} (ref >59)
GFR calc non Af Amer: 81 mL/min/{1.73_m2} (ref >59)
Globulin, Total: 2.6 g/dL (ref 1.5–4.5)
Glucose: 110 mg/dL — ABNORMAL HIGH (ref 65–99)
Potassium: 4.1 mmol/L (ref 3.5–5.2)
Sodium: 137 mmol/L (ref 134–144)
Total Protein: 7.3 g/dL (ref 6.0–8.5)

## 2019-11-18 LAB — HEMOGLOBIN A1C
Est. average glucose Bld gHb Est-mCnc: 126 mg/dL
Hgb A1c MFr Bld: 6 % — ABNORMAL HIGH (ref 4.8–5.6)

## 2019-11-18 LAB — FERRITIN: Ferritin: 87 ng/mL (ref 15–150)

## 2019-11-18 LAB — TSH: TSH: 1.46 u[IU]/mL (ref 0.450–4.500)

## 2019-11-21 LAB — CERVICOVAGINAL ANCILLARY ONLY
Bacterial Vaginitis (gardnerella): POSITIVE — AB
Candida Glabrata: NEGATIVE
Candida Vaginitis: NEGATIVE
Chlamydia: NEGATIVE
Comment: NEGATIVE
Comment: NEGATIVE
Comment: NEGATIVE
Comment: NEGATIVE
Comment: NEGATIVE
Comment: NORMAL
Neisseria Gonorrhea: NEGATIVE
Trichomonas: NEGATIVE

## 2019-11-21 LAB — CYTOLOGY - PAP
Comment: NEGATIVE
Diagnosis: UNDETERMINED — AB
High risk HPV: NEGATIVE

## 2019-11-22 ENCOUNTER — Other Ambulatory Visit: Payer: Self-pay | Admitting: Obstetrics

## 2019-11-22 DIAGNOSIS — N760 Acute vaginitis: Secondary | ICD-10-CM

## 2019-11-22 DIAGNOSIS — B9689 Other specified bacterial agents as the cause of diseases classified elsewhere: Secondary | ICD-10-CM

## 2019-11-22 MED ORDER — TINIDAZOLE 500 MG PO TABS: 1000.0000 mg | ORAL_TABLET | Freq: Every day | ORAL | 2 refills | Status: DC

## 2019-11-23 ENCOUNTER — Other Ambulatory Visit: Payer: Self-pay | Admitting: Obstetrics

## 2019-11-23 DIAGNOSIS — Z1239 Encounter for other screening for malignant neoplasm of breast: Secondary | ICD-10-CM

## 2019-11-24 ENCOUNTER — Other Ambulatory Visit: Payer: Self-pay

## 2019-11-24 ENCOUNTER — Encounter: Payer: Self-pay | Admitting: Internal Medicine

## 2019-11-24 ENCOUNTER — Ambulatory Visit (INDEPENDENT_AMBULATORY_CARE_PROVIDER_SITE_OTHER): Payer: BLUE CROSS/BLUE SHIELD | Admitting: Internal Medicine

## 2019-11-24 VITALS — BP 126/80 | HR 92 | Temp 97.7°F | Ht 61.0 in | Wt 217.6 lb

## 2019-11-24 DIAGNOSIS — Z0000 Encounter for general adult medical examination without abnormal findings: Secondary | ICD-10-CM

## 2019-11-24 DIAGNOSIS — R7303 Prediabetes: Secondary | ICD-10-CM | POA: Diagnosis not present

## 2019-11-24 NOTE — Progress Notes (Signed)
   Subjective:   Patient ID: Diane Wilson, female    DOB: 04-Nov-1980, 39 y.o.   MRN: [id number]  HPI The patient is a 39 YO female coming in new for physical. Labs with ob/gyn recently with stable pre-diabetes. Feels well.   PMH, [facility], social history reviewed and updated  Review of Systems  Constitutional: Negative.   HENT: Negative.   Eyes: Negative.   Respiratory: Negative for cough, chest tightness and shortness of breath.   Cardiovascular: Negative for chest pain, palpitations and leg swelling.  Gastrointestinal: Negative for abdominal distention, abdominal pain, constipation, diarrhea, nausea and vomiting.  Musculoskeletal: Negative.   Skin: Negative.   Neurological: Negative.   Psychiatric/Behavioral: Negative.     Objective:  Physical Exam Constitutional:      Appearance: She is well-developed.  HENT:     Head: Normocephalic and atraumatic.  Cardiovascular:     Rate and Rhythm: Normal rate and regular rhythm.  Pulmonary:     Effort: Pulmonary effort is normal. No respiratory distress.     Breath sounds: Normal breath sounds. No wheezing or rales.  Abdominal:     General: Bowel sounds are normal. There is no distension.     Palpations: Abdomen is soft.     Tenderness: There is no abdominal tenderness. There is no rebound.  Musculoskeletal:     Cervical back: Normal range of motion.  Skin:    General: Skin is warm and dry.  Neurological:     Mental Status: She is alert and oriented to person, place, and time.     Coordination: Coordination normal.     Vitals:   11/24/19 1500  BP: 126/80  Pulse: 92  Temp: 97.7 F (36.5 C)  SpO2: 99%  Weight: 217 lb 9.6 oz (98.7 kg)  Height: 5\' 1"  (1.549 m)    This visit occurred during the SARS-CoV-2 public health emergency.  Safety protocols were in place, including screening questions prior to the visit, additional usage of staff PPE, and extensive cleaning of exam room while observing appropriate contact time as  indicated for disinfecting solutions.   Assessment & Plan:

## 2019-11-24 NOTE — Patient Instructions (Addendum)
Work on adding exercise 3 days per week.  Work on reducing sugary drinks and reducing portions of carbohydrates.    Health Maintenance, Female Adopting a healthy lifestyle and getting preventive care are important in promoting health and wellness. Ask your health care provider about:  The right schedule for you to have regular tests and exams.  Things you can do on your own to prevent diseases and keep yourself healthy. What should I know about diet, weight, and exercise? Eat a healthy diet   Eat a diet that includes plenty of vegetables, fruits, low-fat dairy products, and lean protein.  Do not eat a lot of foods that are high in solid fats, added sugars, or sodium. Maintain a healthy weight Body mass index (BMI) is used to identify weight problems. It estimates body fat based on height and weight. Your health care provider can help determine your BMI and help you achieve or maintain a healthy weight. Get regular exercise Get regular exercise. This is one of the most important things you can do for your health. Most adults should:  Exercise for at least 150 minutes each week. The exercise should increase your heart rate and make you sweat (moderate-intensity exercise).  Do strengthening exercises at least twice a week. This is in addition to the moderate-intensity exercise.  Spend less time sitting. Even light physical activity can be beneficial. Watch cholesterol and blood lipids Have your blood tested for lipids and cholesterol at 41 years of age, then have this test every 5 years. Have your cholesterol levels checked more often if:  Your lipid or cholesterol levels are high.  You are older than 41 years of age.  You are at high risk for heart disease. What should I know about cancer screening? Depending on your health history and family history, you may need to have cancer screening at various ages. This may include screening for:  Breast cancer.  Cervical  cancer.  Colorectal cancer.  Skin cancer.  Lung cancer. What should I know about heart disease, diabetes, and high blood pressure? Blood pressure and heart disease  High blood pressure causes heart disease and increases the risk of stroke. This is more likely to develop in people who have high blood pressure readings, are of African descent, or are overweight.  Have your blood pressure checked: ? Every 3-5 years if you are 15-5 years of age. ? Every year if you are 24 years old or older. Diabetes Have regular diabetes screenings. This checks your fasting blood sugar level. Have the screening done:  Once every three years after age 62 if you are at a normal weight and have a low risk for diabetes.  More often and at a younger age if you are overweight or have a high risk for diabetes. What should I know about preventing infection? Hepatitis B If you have a higher risk for hepatitis B, you should be screened for this virus. Talk with your health care provider to find out if you are at risk for hepatitis B infection. Hepatitis C Testing is recommended for:  Everyone born from [birth year] through 1967.  Anyone with known risk factors for hepatitis C. Sexually transmitted infections (STIs)  Get screened for STIs, including gonorrhea and chlamydia, if: ? You are sexually active and are younger than 41 years of age. ? You are older than 41 years of age and your health care provider tells you that you are at risk for this type of infection. ? Your sexual activity has  changed since you were last screened, and you are at increased risk for chlamydia or gonorrhea. Ask your health care provider if you are at risk.  Ask your health care provider about whether you are at high risk for HIV. Your health care provider may recommend a prescription medicine to help prevent HIV infection. If you choose to take medicine to prevent HIV, you should first get tested for HIV. You should then be tested every 3  months for as long as you are taking the medicine. Pregnancy  If you are about to stop having your period (premenopausal) and you may become pregnant, seek counseling before you get pregnant.  Take 400 to 800 micrograms (mcg) of folic acid every day if you become pregnant.  Ask for birth control (contraception) if you want to prevent pregnancy. Osteoporosis and menopause Osteoporosis is a disease in which the bones lose minerals and strength with aging. This can result in bone fractures. If you are 35 years old or older, or if you are at risk for osteoporosis and fractures, ask your health care provider if you should:  Be screened for bone loss.  Take a calcium or vitamin D supplement to lower your risk of fractures.  Be given hormone replacement therapy (HRT) to treat symptoms of menopause. Follow these instructions at home: Lifestyle  Do not use any products that contain nicotine or tobacco, such as cigarettes, e-cigarettes, and chewing tobacco. If you need help quitting, ask your health care provider.  Do not use street drugs.  Do not share needles.  Ask your health care provider for help if you need support or information about quitting drugs. Alcohol use  Do not drink alcohol if: ? Your health care provider tells you not to drink. ? You are pregnant, may be pregnant, or are planning to become pregnant.  If you drink alcohol: ? Limit how much you use to 0-1 drink a day. ? Limit intake if you are breastfeeding.  Be aware of how much alcohol is in your drink. In the [place] one drink equals one 12 oz bottle of beer (355 mL), one 5 oz glass of wine (148 mL), or one 1 oz glass of hard liquor (44 mL). General instructions  Schedule regular health, dental, and eye exams.  Stay current with your vaccines.  Tell your health care provider if: ? You often feel depressed. ? You have ever been abused or do not feel safe at home. Summary  Adopting a healthy lifestyle and getting  preventive care are important in promoting health and wellness.  Follow your health care provider's instructions about healthy diet, exercising, and getting tested or screened for diseases.  Follow your health care provider's instructions on monitoring your cholesterol and blood pressure. This information is not intended to replace advice given to you by your health care provider. Make sure you discuss any questions you have with your health care provider. Document Revised: 07/07/2018 Document Reviewed: 07/07/2018 Elsevier Patient Education  2020 [employer].

## 2019-11-24 NOTE — Assessment & Plan Note (Signed)
Flu shot due next season. Tetanus up to date. Mammogram with gyn, pap smear with gyn. Counseled about sun safety and mole surveillance. Counseled about the dangers of distracted driving. Given 10 year screening recommendations.

## 2019-11-24 NOTE — Assessment & Plan Note (Signed)
HgA1c 6.0, we talked extensively today about the pathogenesis of diabetes, her increased risk for progression to diabetes given strong family history, need to limit carbs/sugars and add exercise to her life to help. Reasonable to recheck in 6-12 months.

## 2020-03-09 ENCOUNTER — Encounter: Payer: Self-pay | Admitting: Obstetrics

## 2020-03-09 ENCOUNTER — Ambulatory Visit: Payer: BLUE CROSS/BLUE SHIELD | Admitting: Obstetrics

## 2020-03-09 ENCOUNTER — Other Ambulatory Visit: Payer: Self-pay

## 2020-03-09 VITALS — BP 133/81 | HR 81 | Wt 213.7 lb

## 2020-03-09 DIAGNOSIS — Z30433 Encounter for removal and reinsertion of intrauterine contraceptive device: Secondary | ICD-10-CM

## 2020-03-09 DIAGNOSIS — Z01812 Encounter for preprocedural laboratory examination: Secondary | ICD-10-CM | POA: Diagnosis not present

## 2020-03-09 LAB — POCT URINE PREGNANCY: Preg Test, Ur: NEGATIVE

## 2020-03-09 MED ORDER — LEVONORGESTREL 20 MCG/24HR IU IUD: INTRAUTERINE_SYSTEM | Freq: Once | INTRAUTERINE | Status: AC

## 2020-03-09 NOTE — Progress Notes (Addendum)
    GYNECOLOGY OFFICE PROCEDURE NOTE  Diane Wilson is a 39 y.o. [phone] here for Mirena IUD removal and reinsertion. No GYN concerns.  Last pap smear was on 11-17-2019 and was normal.  IUD Removal and Reinsertion  Patient identified, informed consent performed, consent signed.   Discussed risks of irregular bleeding, cramping, infection, malpositioning or misplacement of the IUD outside the uterus which may require further procedures. Also discussed >99% contraception efficacy, increased risk of ectopic pregnancy with failure of method.   Emphasized that this did not protect against STIs, condoms recommended during all sexual encounters.Advised to use backup contraception for one week as the risk of pregnancy is higher during the transition period of removing an IUD and replacing it with another one. Time out was performed. Speculum placed in the vagina. The strings of the IUD were grasped and pulled using ring forceps. The IUD was successfully removed in its entirety. The cervix was cleaned with Betadine x 2 and grasped anteriorly with a single tooth tenaculum.  The new Mirena IUD insertion apparatus was used to sound the uterus to 7 cm;  the IUD was then placed per manufacturer's recommendations. Strings trimmed to 3 cm. Tenaculum was removed, good hemostasis noted. Patient tolerated procedure well.   IUD:  Mirena Lot #: [id number] Exp:  SEP  2023  Patient was given post-procedure instructions.  She was reminded to have backup contraception for one week during this transition period between IUDs.  Patient was also asked to check IUD strings periodically and follow up in 4 weeks for IUD check.    Brock Bad, MD, FACOG Obstetrician & Gynecologist, [facility] for [facility], [facility] 03/09/20

## 2020-03-09 NOTE — Progress Notes (Signed)
Pt presents for Mirena IUD removal and reinsertion. IUD expired June 1st

## 2020-03-24 ENCOUNTER — Other Ambulatory Visit: Payer: Self-pay

## 2020-03-24 ENCOUNTER — Encounter (HOSPITAL_COMMUNITY): Payer: Self-pay

## 2020-03-24 ENCOUNTER — Ambulatory Visit (HOSPITAL_COMMUNITY)
Admission: EM | Admit: 2020-03-24 | Discharge: 2020-03-24 | Disposition: A | Discharge status: Home / Self Care | Payer: BLUE CROSS/BLUE SHIELD | Attending: Emergency Medicine | Admitting: Emergency Medicine

## 2020-03-24 DIAGNOSIS — M546 Pain in thoracic spine: Secondary | ICD-10-CM

## 2020-03-24 MED ORDER — CYCLOBENZAPRINE HCL 10 MG PO TABS: 10.0000 mg | ORAL_TABLET | Freq: Two times a day (BID) | ORAL | 0 refills | Status: DC | PRN

## 2020-03-24 NOTE — ED Provider Notes (Signed)
[facility]    CSN: [id number] Arrival date & time: 03/24/20  1008      History   Chief Complaint Chief Complaint  Patient presents with  . Back Pain    HPI Diane Wilson is a 39 y.o. female.   Patient presents with right upper back muscle pain x6 days.  She states that began when she was moving furniture and lifted a recliner.  She denies numbness, tingling, weakness, decreased range of motion, chest pain, shortness of breath, abdominal pain, or other symptoms.  Treatment attempted at home with Aleve.  The history is provided by the patient.    Past Medical History:  Diagnosis Date  . Frequent headaches   . Mitral valve prolapse     Patient Active Problem List   Diagnosis Date Noted  . Routine general medical examination at a health care facility 11/24/2019  . IUD check up 11/02/2015  . Prediabetes 11/02/2015  . Mitral valve prolapse 12/18/2011    Past Surgical History:  Procedure Laterality Date  . CESAREAN SECTION    . TUBAL LIGATION      OB History    Gravida  2   Para  2   Term  2   Preterm      AB      Living  2     SAB      TAB      Ectopic      Multiple      Live Births  2            Home Medications    Prior to Admission medications   Medication Sig Start Date End Date Taking? Authorizing Provider  cyclobenzaprine (FLEXERIL) 10 MG tablet Take 1 tablet (10 mg total) by mouth 2 (two) times daily as needed for muscle spasms. 03/24/20   Mickie Bail, NP  ibuprofen (ADVIL) 600 MG tablet Take 1 tablet (600 mg total) by mouth every 6 (six) hours as needed. Patient not taking: Reported on 03/09/2020 09/03/19   Domenick Gong, MD  tinidazole [facility]) 500 MG tablet Take 2 tablets (1,000 mg total) by mouth daily with breakfast. Patient not taking: Reported on 03/09/2020 11/22/19   Brock Bad, MD    Family History Family History  Problem Relation Age of Onset  . Cancer Father   . Diabetes Other   . Hypertension  Other   . Diabetes Maternal Aunt   . Diabetes Maternal Uncle   . Diabetes Maternal Grandmother   . Diabetes Maternal Grandfather   . Diabetes Cousin     Social History Social History   Tobacco Use  . Smoking status: Never Smoker  . Smokeless tobacco: Never Used  Vaping Use  . Vaping Use: Never used  Substance Use Topics  . Alcohol use: No  . Drug use: No     Allergies   Codeine and Oxycontin [oxycodone hcl]   Review of Systems Review of Systems  Constitutional: Negative for chills and fever.  HENT: Negative for ear pain and sore throat.   Eyes: Negative for pain and visual disturbance.  Respiratory: Negative for cough and shortness of breath.   Cardiovascular: Negative for chest pain and palpitations.  Gastrointestinal: Negative for abdominal pain and vomiting.  Genitourinary: Negative for dysuria and hematuria.  Musculoskeletal: Positive for back pain. Negative for arthralgias.  Skin: Negative for color change and rash.  Neurological: Negative for seizures, syncope, weakness and numbness.  All other systems reviewed and are negative.  Physical Exam Triage Vital Signs ED Triage Vitals  Enc Vitals Group     BP 03/24/20 1040 126/83     Pulse Rate 03/24/20 1040 61     Resp 03/24/20 1040 16     Temp 03/24/20 1040 99.6 F (37.6 C)     Temp src --      SpO2 03/24/20 1040 98 %     Weight --      Height --      Head Circumference --      Peak Flow --      Pain Score 03/24/20 1039 7     Pain Loc --      Pain Edu? --      Excl. in GC? --    No data found.  Updated Vital Signs BP 126/83   Pulse 61   Temp 99.6 F (37.6 C)   Resp 16   LMP 02/23/2020   SpO2 98%   Visual Acuity Right Eye Distance:   Left Eye Distance:   Bilateral Distance:    Right Eye Near:   Left Eye Near:    Bilateral Near:     Physical Exam Vitals and nursing note reviewed.  Constitutional:      General: She is not in acute distress.    Appearance: She is well-developed.  She is not ill-appearing.  HENT:     Head: Normocephalic and atraumatic.     Mouth/Throat:     Mouth: Mucous membranes are moist.  Eyes:     Conjunctiva/sclera: Conjunctivae normal.  Cardiovascular:     Rate and Rhythm: Normal rate and regular rhythm.     Heart sounds: No murmur heard.   Pulmonary:     Effort: Pulmonary effort is normal. No respiratory distress.     Breath sounds: Normal breath sounds.  Abdominal:     Palpations: Abdomen is soft.     Tenderness: There is no abdominal tenderness.  Musculoskeletal:        General: Tenderness present. No swelling, deformity or signs of injury. Normal range of motion.     Cervical back: Neck supple.       Back:  Skin:    General: Skin is warm and dry.     Capillary Refill: Capillary refill takes less than 2 seconds.     Findings: No bruising, erythema, lesion or rash.  Neurological:     General: No focal deficit present.     Mental Status: She is alert and oriented to person, place, and time.     Sensory: No sensory deficit.     Motor: No weakness.     Gait: Gait normal.  Psychiatric:        Mood and Affect: Mood normal.        Behavior: Behavior normal.      UC Treatments / Results  Labs (all labs ordered are listed, but only abnormal results are displayed) Labs Reviewed - No data to display  EKG   Radiology No results found.  Procedures Procedures (including critical care time)  Medications Ordered in UC Medications - No data to display  Initial Impression / Assessment and Plan / UC Course  I have reviewed the triage vital signs and the nursing notes.  Pertinent labs & imaging results that were available during my care of the patient were reviewed by me and considered in my medical decision making (see chart for details).   Acute right upper back pain.  Instructed patient to continue taking Aleve as  needed for pain.  Prescribed Flexeril for muscle spasm; precautions for drowsiness with this medication  discussed.  Instructed her to follow-up with her PCP or an orthopedist if her pain is not improving.  Patient agrees to plan of care.   Final Clinical Impressions(s) / UC Diagnoses   Final diagnoses:  Acute right-sided thoracic back pain     Discharge Instructions     Continue to take Aleve as needed for your pain.  Take the muscle relaxer Flexeril as needed for muscle spasm; Do not drive, operate machinery, or drink alcohol with this medication as it may make you drowsy.    Follow up with your primary care provider or an orthopedist if your pain is not improving.        ED Prescriptions    Medication Sig Dispense Auth. Provider   cyclobenzaprine (FLEXERIL) 10 MG tablet Take 1 tablet (10 mg total) by mouth 2 (two) times daily as needed for muscle spasms. 20 tablet Mickie Bail, NP     I have reviewed the PDMP during this encounter.   Mickie Bail, NP 03/24/20 1105

## 2020-03-24 NOTE — Discharge Instructions (Signed)
Continue to take Aleve as needed for your pain.  Take the muscle relaxer Flexeril as needed for muscle spasm; Do not drive, operate machinery, or drink alcohol with this medication as it may make you drowsy.    Follow up with your primary care provider or an orthopedist if your pain is not improving.

## 2020-03-24 NOTE — ED Triage Notes (Signed)
Patient c/o upper right back pain since Sunday. States it is right under the right shoulder blade.

## 2020-05-01 ENCOUNTER — Ambulatory Visit (INDEPENDENT_AMBULATORY_CARE_PROVIDER_SITE_OTHER): Payer: BLUE CROSS/BLUE SHIELD | Admitting: Obstetrics

## 2020-05-01 ENCOUNTER — Encounter: Payer: Self-pay | Admitting: Obstetrics

## 2020-05-01 ENCOUNTER — Other Ambulatory Visit: Payer: Self-pay

## 2020-05-01 VITALS — BP 112/77 | HR 72 | Wt 210.6 lb

## 2020-05-01 DIAGNOSIS — Z30431 Encounter for routine checking of intrauterine contraceptive device: Secondary | ICD-10-CM

## 2020-05-01 NOTE — Progress Notes (Signed)
Subjective:    Diane Wilson  who presents for contraception counseling. The patient has no complaints today. The patient is sexually active. Pertinent past medical history: none.  Likes her Mirena IUD.  Is not currently bleeding with the IUD.    The information documented in the HPI was reviewed and verified.  Menstrual History: OB History    Gravida Para Term Preterm AB Living   1 1 1     1    SAB TAB Ectopic Multiple Live Births         0 1      No LMP recorded. has Mirena IUD   Patient Active Problem List   Diagnosis Date Noted   Patient Active Problem List   Diagnosis Date Noted  . Routine general medical examination at a health care facility 11/24/2019  . IUD check up 11/02/2015  . Prediabetes 11/02/2015  . Mitral valve prolapse 12/18/2011    No past medical history on file.  Past Surgical History:  Procedure Laterality Date   Past Surgical History:  Procedure Laterality Date  . CESAREAN SECTION    . TUBAL LIGATION        Current Outpatient Prescriptions:  No medication comments found. Current Outpatient Medications on File Prior to Visit  Medication Sig Dispense Refill  . cyclobenzaprine (FLEXERIL) 10 MG tablet Take 1 tablet (10 mg total) by mouth 2 (two) times daily as needed for muscle spasms. (Patient not taking: Reported on 05/01/2020) 20 tablet 0  . ibuprofen (ADVIL) 600 MG tablet Take 1 tablet (600 mg total) by mouth every 6 (six) hours as needed. (Patient not taking: Reported on 03/09/2020) 30 tablet 0  . tinidazole (TINDAMAX) 500 MG tablet Take 2 tablets (1,000 mg total) by mouth daily with breakfast. (Patient not taking: Reported on 03/09/2020) 10 tablet 2   No current facility-administered medications on file prior to visit.    Allergies  Allergen Reactions   Allergies  Allergen Reactions  . Codeine Nausea And Vomiting  . Oxycontin [Oxycodone Hcl] Nausea And Vomiting     Social History  Substance Use Topics   Social History   Socioeconomic  History  . Marital status: Single    Spouse name: Not on file  . Number of children: 2  . Years of education: Not on file  . 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 Smoking status: Never Smoker  . Smokeless tobacco: Never Used  Vaping Use  . Vaping Use: Never used  Substance and Sexual Activity  . Alcohol use: No  . Drug use: No  . Sexual activity: Yes    Partners: Male    Birth control/protection: I.U.D.    Comment: BTL  Other Topics Concern  . Not on file  Social History Narrative  . Not on file   Social Determinants of Health   Financial Resource Strain:   . Difficulty of Paying Living Expenses: Not on file  Food Insecurity:   . Worried About 03/11/2020 in the Last Year: Not on file  . Ran Out of Food in the Last Year: Not on file  Transportation Needs:   . Lack of Transportation (Medical): Not on file  . Lack of Transportation (Non-Medical): Not on file  Physical Activity:   . Days of Exercise per Week: Not on file  . Minutes of Exercise per Session: Not on file  Stress:   .  Feeling of Stress : Not on file  Social Connections:   . Frequency of Communication with Friends and Family: Not on file  . Frequency of Social Gatherings with Friends and Family: Not on file  . Attends Religious Services: Not on file  . Active Member of Clubs or Organizations: Not on file  . Attends Banker Meetings: Not on file  . Marital Status: Not on file  Intimate Partner Violence:   . Fear of Current or Ex-Partner: Not on file  . Emotionally Abused: Not on file  . Physically Abused: Not on file  . Sexually Abused: Not on file     No family history on file.     Review of Systems Constitutional: negative for weight loss Genitourinary:negative for abnormal menstrual periods and vaginal discharge   Objective:   BP 115/77   Pulse 89   Wt 161 lb (73 kg)   BMI 25.22  kg/m    General:   alert and no distress  Skin:   no rash or abnormalities  Lungs:   clear to auscultation bilaterally  Heart:   regular rate and rhythm, S1, S2 normal, no murmur, click, rub or gallop  Breasts:   deferred  Abdomen:  normal findings: no organomegaly, soft, non-tender and no hernia  Pelvis:  External genitalia: normal general appearance Urinary system: urethral meatus normal and bladder without fullness, nontender Vaginal: normal without tenderness, induration or masses Cervix: normal appearance, IUD strings present Adnexa: normal bimanual exam Uterus: anteverted and non-tender, normal size   Lab Review Urine pregnancy test Labs reviewed yes Radiologic studies reviewed no  50% of 15 min visit spent on counseling and coordination of care.    Assessment:    39 y.o., continuing IUD, no contraindications.   Plan:    All questions answered.  Follow up as needed or in 1 year for annual exam.  Brock Bad, MD 05/01/2020 8:31 AM

## 2020-05-01 NOTE — Progress Notes (Signed)
Pt is here for IUD string check.   IUD Mirena placed 03/09/20.

## 2020-09-08 ENCOUNTER — Encounter (HOSPITAL_COMMUNITY): Payer: Self-pay | Admitting: Emergency Medicine

## 2020-09-08 ENCOUNTER — Other Ambulatory Visit: Payer: Self-pay

## 2020-09-08 ENCOUNTER — Ambulatory Visit (HOSPITAL_COMMUNITY)
Admission: EM | Admit: 2020-09-08 | Discharge: 2020-09-08 | Disposition: A | Discharge status: Home / Self Care | Payer: BLUE CROSS/BLUE SHIELD | Attending: Emergency Medicine | Admitting: Emergency Medicine

## 2020-09-08 DIAGNOSIS — S46911A Strain of unspecified muscle, fascia and tendon at shoulder and upper arm level, right arm, initial encounter: Secondary | ICD-10-CM

## 2020-09-08 MED ORDER — METHOCARBAMOL 500 MG PO TABS: 500.0000 mg | ORAL_TABLET | Freq: Two times a day (BID) | ORAL | 0 refills | Status: DC | PRN

## 2020-09-08 MED ORDER — IBUPROFEN 600 MG PO TABS: 600.0000 mg | ORAL_TABLET | Freq: Four times a day (QID) | ORAL | 0 refills | Status: DC | PRN

## 2020-09-08 NOTE — ED Provider Notes (Signed)
[facility]    CSN: [id number] Arrival date & time: 09/08/20  1003      History   Chief Complaint Chief Complaint  Patient presents with  . Shoulder Pain    HPI Diane Wilson is a 40 y.o. female.   Patient presents with right shoulder pain x4 to 5 days.  She attributes this to new steering wheel in her car which is very tight to turn.  She thinks she strained the muscles in her shoulder.  She denies numbness, weakness, paresthesias, open wounds, redness, bruising, or other symptoms.  Treatment attempted at home with Tylenol.  Her medical history includes mitral valve prolapse, frequent headaches, and prediabetes.  The history is provided by the patient and medical records.    Past Medical History:  Diagnosis Date  . Frequent headaches   . Mitral valve prolapse     Patient Active Problem List   Diagnosis Date Noted  . Routine general medical examination at a health care facility 11/24/2019  . IUD check up 11/02/2015  . Prediabetes 11/02/2015  . Mitral valve prolapse 12/18/2011    Past Surgical History:  Procedure Laterality Date  . CESAREAN SECTION    . TUBAL LIGATION      OB History    Gravida  2   Para  2   Term  2   Preterm      AB      Living  2     SAB      IAB      Ectopic      Multiple      Live Births  2            Home Medications    Prior to Admission medications   Medication Sig Start Date End Date Taking? Authorizing Provider  ibuprofen (ADVIL) 600 MG tablet Take 1 tablet (600 mg total) by mouth every 6 (six) hours as needed. 09/08/20  Yes Mickie Bail, NP  methocarbamol (ROBAXIN) 500 MG tablet Take 1 tablet (500 mg total) by mouth 2 (two) times daily as needed for muscle spasms. 09/08/20  Yes Mickie Bail, NP  tinidazole (TINDAMAX) 500 MG tablet Take 2 tablets (1,000 mg total) by mouth daily with breakfast. Patient not taking: No sig reported 11/22/19   Brock Bad, MD    Family History Family History   Problem Relation Age of Onset  . Cancer Father   . Diabetes Other   . Hypertension Other   . Diabetes Maternal Aunt   . Diabetes Maternal Uncle   . Diabetes Maternal Grandmother   . Diabetes Maternal Grandfather   . Diabetes Cousin     Social History Social History   Tobacco Use  . Smoking status: Never Smoker  . Smokeless tobacco: Never Used  Vaping Use  . Vaping Use: Never used  Substance Use Topics  . Alcohol use: No  . Drug use: No     Allergies   Codeine and Oxycontin [oxycodone hcl]   Review of Systems Review of Systems  Constitutional: Negative for chills and fever.  HENT: Negative for ear pain and sore throat.   Eyes: Negative for pain and visual disturbance.  Respiratory: Negative for cough and shortness of breath.   Cardiovascular: Negative for chest pain and palpitations.  Gastrointestinal: Negative for abdominal pain and vomiting.  Genitourinary: Negative for dysuria and hematuria.  Musculoskeletal: Positive for myalgias. Negative for arthralgias and back pain.  Skin: Negative for color change and rash.  Neurological: Negative for syncope, weakness and numbness.  All other systems reviewed and are negative.    Physical Exam Triage Vital Signs ED Triage Vitals  Enc Vitals Group     BP      Pulse      Resp      Temp      Temp src      SpO2      Weight      Height      Head Circumference      Peak Flow      Pain Score      Pain Loc      Pain Edu?      Excl. in GC?    No data found.  Updated Vital Signs BP 128/79 (BP Location: Right Arm)   Pulse (!) 101   Temp 98.3 F (36.8 C) (Oral)   Resp 17   LMP 09/07/2020   SpO2 100%   Visual Acuity Right Eye Distance:   Left Eye Distance:   Bilateral Distance:    Right Eye Near:   Left Eye Near:    Bilateral Near:     Physical Exam Vitals and nursing note reviewed.  Constitutional:      General: She is not in acute distress.    Appearance: She is well-developed and well-nourished.  She is not ill-appearing.  HENT:     Head: Normocephalic and atraumatic.     Mouth/Throat:     Mouth: Mucous membranes are moist.  Eyes:     Conjunctiva/sclera: Conjunctivae normal.  Cardiovascular:     Rate and Rhythm: Normal rate and regular rhythm.     Heart sounds: Normal heart sounds. No murmur heard.   Pulmonary:     Effort: Pulmonary effort is normal. No respiratory distress.     Breath sounds: Normal breath sounds.  Abdominal:     Palpations: Abdomen is soft.     Tenderness: There is no abdominal tenderness. There is no guarding or rebound.  Musculoskeletal:        General: Tenderness present. No swelling, deformity or edema. Normal range of motion.       Arms:     Cervical back: Neck supple.     Comments: Right trapezius muscle tender when palpated.   Skin:    General: Skin is warm and dry.     Capillary Refill: Capillary refill takes less than 2 seconds.     Findings: No bruising, erythema, lesion or rash.  Neurological:     General: No focal deficit present.     Mental Status: She is alert and oriented to person, place, and time.     Sensory: No sensory deficit.     Motor: No weakness.     Gait: Gait normal.  Psychiatric:        Mood and Affect: Mood and affect and mood normal.        Behavior: Behavior normal.      UC Treatments / Results  Labs (all labs ordered are listed, but only abnormal results are displayed) Labs Reviewed - No data to display  EKG   Radiology No results found.  Procedures Procedures (including critical care time)  Medications Ordered in UC Medications - No data to display  Initial Impression / Assessment and Plan / UC Course  I have reviewed the triage vital signs and the nursing notes.  Pertinent labs & imaging results that were available during my care of the patient were reviewed by me and considered  in my medical decision making (see chart for details).   Muscle strain of right shoulder region.  Treating with  ibuprofen and Robaxin.  Precautions for drowsiness with Robaxin discussed.  Instructed patient to follow-up with her PCP or an orthopedist if her symptoms are not improving.  She agrees to plan of care.   Final Clinical Impressions(s) / UC Diagnoses   Final diagnoses:  Muscle strain of right shoulder region, initial encounter     Discharge Instructions     Take ibuprofen as needed for discomfort.  Take the muscle relaxer as needed for muscle spasm; Do not drive, operate machinery, or drink alcohol with this medication as it can cause drowsiness.   Follow up with your primary care provider or an orthopedist if your symptoms are not improving.          ED Prescriptions    Medication Sig Dispense Auth. Provider   ibuprofen (ADVIL) 600 MG tablet Take 1 tablet (600 mg total) by mouth every 6 (six) hours as needed. 30 tablet Mickie Bail, NP   methocarbamol (ROBAXIN) 500 MG tablet Take 1 tablet (500 mg total) by mouth 2 (two) times daily as needed for muscle spasms. 10 tablet Mickie Bail, NP     I have reviewed the PDMP during this encounter.   Mickie Bail, NP 09/08/20 1046

## 2020-09-08 NOTE — ED Triage Notes (Signed)
Pt states that she has a new steering wheel on her car that has been tedious to turn and has cause shoulder pain.

## 2020-09-08 NOTE — Discharge Instructions (Signed)
Take ibuprofen as needed for discomfort.  Take the muscle relaxer as needed for muscle spasm; Do not drive, operate machinery, or drink alcohol with this medication as it can cause drowsiness.   Follow up with your primary care provider or an orthopedist if your symptoms are not improving.     

## 2020-09-27 ENCOUNTER — Other Ambulatory Visit: Payer: Self-pay

## 2020-09-27 ENCOUNTER — Encounter (HOSPITAL_COMMUNITY): Payer: Self-pay | Admitting: Emergency Medicine

## 2020-09-27 ENCOUNTER — Emergency Department (HOSPITAL_COMMUNITY)
Admission: EM | Admit: 2020-09-27 | Discharge: 2020-09-28 | Disposition: A | Discharge status: Home / Self Care | Payer: BLUE CROSS/BLUE SHIELD | Attending: Emergency Medicine | Admitting: Emergency Medicine

## 2020-09-27 ENCOUNTER — Ambulatory Visit (HOSPITAL_COMMUNITY)
Admission: EM | Admit: 2020-09-27 | Discharge: 2020-09-27 | Disposition: A | Discharge status: Home / Self Care | Payer: BLUE CROSS/BLUE SHIELD | Attending: Emergency Medicine | Admitting: Emergency Medicine

## 2020-09-27 ENCOUNTER — Emergency Department (HOSPITAL_COMMUNITY): Payer: BLUE CROSS/BLUE SHIELD

## 2020-09-27 DIAGNOSIS — R079 Chest pain, unspecified: Secondary | ICD-10-CM

## 2020-09-27 DIAGNOSIS — R072 Precordial pain: Secondary | ICD-10-CM | POA: Insufficient documentation

## 2020-09-27 LAB — CBC
HCT: 39.3 % (ref 36.0–46.0)
Hemoglobin: 12.5 g/dL (ref 12.0–15.0)
MCH: 28 pg (ref 26.0–34.0)
MCHC: 31.8 g/dL (ref 30.0–36.0)
MCV: 87.9 fL (ref 80.0–100.0)
Platelets: 361 10*3/uL (ref 150–400)
RBC: 4.47 MIL/uL (ref 3.87–5.11)
RDW: 12.8 % (ref 11.5–15.5)
WBC: 6.5 10*3/uL (ref 4.0–10.5)
nRBC: 0 % (ref 0.0–0.2)

## 2020-09-27 LAB — BASIC METABOLIC PANEL
Anion gap: 10 (ref 5–15)
BUN: 6 mg/dL (ref 6–20)
CO2: 23 mmol/L (ref 22–32)
Calcium: 9.2 mg/dL (ref 8.9–10.3)
Chloride: 102 mmol/L (ref 98–111)
Creatinine, Ser: 0.99 mg/dL (ref 0.44–1.00)
GFR, Estimated: >60 mL/min (ref >60)
Glucose, Bld: 107 mg/dL — ABNORMAL HIGH (ref 70–99)
Potassium: 3.9 mmol/L (ref 3.5–5.1)
Sodium: 135 mmol/L (ref 135–145)

## 2020-09-27 LAB — I-STAT BETA HCG BLOOD, ED (MC, WL, AP ONLY): I-stat hCG, quantitative: <5 m[IU]/mL (ref <5)

## 2020-09-27 LAB — TROPONIN I (HIGH SENSITIVITY): Troponin I (High Sensitivity): 3 ng/L (ref <18)

## 2020-09-27 MED ORDER — ALUM & MAG HYDROXIDE-SIMETH 200-200-20 MG/5ML PO SUSP
30.0000 mL | Freq: Once | ORAL | Status: AC
  Administered 2020-09-28: 30 mL via ORAL
  Filled 2020-09-27: qty 30

## 2020-09-27 NOTE — ED Notes (Signed)
Patient is being discharged from the Urgent Care and sent to the Emergency Department via POV with spouse . Per Leanna Battles, NP, patient is in need of higher level of care due to Chest Pain. Patient is aware and verbalizes understanding of plan of care.  Vitals:   09/27/20 1949  BP: 136/86  Pulse: 89  Resp: 18  Temp: 97.9 F (36.6 C)  SpO2: 100%

## 2020-09-27 NOTE — ED Provider Notes (Signed)
[facility]    CSN: [id number] Arrival date & time: 09/27/20  1936      History   Chief Complaint Chief Complaint  Patient presents with  . Chest Pain    HPI Diane Wilson is a 40 y.o. female.  Patient presents with midsternal chest pain radiating to her left breast since this morning. She states the symptoms started after she ate a snack. She reports nausea at the onset of her symptoms but none currently. She denies shortness of breath, numbness, weakness, or other symptoms. Treatment attempted with aspirin and Pepcid without relief. Patient was seen here on 09/08/2020; diagnosed with muscle strain of right shoulder; treated with ibuprofen and Robaxin. Her medical history includes mitral valve prolapse, frequent headaches, prediabetes.  The history is provided by the patient and medical records.    Past Medical History:  Diagnosis Date  . Frequent headaches   . Mitral valve prolapse     Patient Active Problem List   Diagnosis Date Noted  . Routine general medical examination at a health care facility 11/24/2019  . IUD check up 11/02/2015  . Prediabetes 11/02/2015  . Mitral valve prolapse 12/18/2011    Past Surgical History:  Procedure Laterality Date  . CESAREAN SECTION    . TUBAL LIGATION      OB History    Gravida  2   Para  2   Term  2   Preterm      AB      Living  2     SAB      IAB      Ectopic      Multiple      Live Births  2            Home Medications    Prior to Admission medications   Medication Sig Start Date End Date Taking? Authorizing Provider  ibuprofen (ADVIL) 600 MG tablet Take 1 tablet (600 mg total) by mouth every 6 (six) hours as needed. 09/08/20   Mickie Bail, NP  methocarbamol (ROBAXIN) 500 MG tablet Take 1 tablet (500 mg total) by mouth 2 (two) times daily as needed for muscle spasms. 09/08/20   Mickie Bail, NP  tinidazole (TINDAMAX) 500 MG tablet Take 2 tablets (1,000 mg total) by mouth daily with  breakfast. Patient not taking: No sig reported 11/22/19   Brock Bad, MD    Family History Family History  Problem Relation Age of Onset  . Cancer Father   . Diabetes Other   . Hypertension Other   . Diabetes Maternal Aunt   . Diabetes Maternal Uncle   . Diabetes Maternal Grandmother   . Diabetes Maternal Grandfather   . Diabetes Cousin     Social History Social History   Tobacco Use  . Smoking status: Never Smoker  . Smokeless tobacco: Never Used  Vaping Use  . Vaping Use: Never used  Substance Use Topics  . Alcohol use: No  . Drug use: No     Allergies   Codeine and Oxycontin [oxycodone hcl]   Review of Systems Review of Systems  Constitutional: Negative for chills and fever.  HENT: Negative for ear pain and sore throat.   Eyes: Negative for pain and visual disturbance.  Respiratory: Negative for cough and shortness of breath.   Cardiovascular: Positive for chest pain. Negative for palpitations.  Gastrointestinal: Positive for nausea. Negative for abdominal pain, diarrhea and vomiting.  Genitourinary: Negative for dysuria and hematuria.  Musculoskeletal:  Negative for arthralgias and back pain.  Skin: Negative for color change and rash.  Neurological: Negative for seizures and syncope.  All other systems reviewed and are negative.    Physical Exam Triage Vital Signs ED Triage Vitals  Enc Vitals Group     BP      Pulse      Resp      Temp      Temp src      SpO2      Weight      Height      Head Circumference      Peak Flow      Pain Score      Pain Loc      Pain Edu?      Excl. in GC?    No data found.  Updated Vital Signs BP 136/86 (BP Location: Left Arm)   Pulse 89   Temp 97.9 F (36.6 C) (Oral)   Resp 18   LMP 09/10/2020   SpO2 100%   Visual Acuity Right Eye Distance:   Left Eye Distance:   Bilateral Distance:    Right Eye Near:   Left Eye Near:    Bilateral Near:     Physical Exam Vitals and nursing note reviewed.   Constitutional:      General: She is not in acute distress.    Appearance: She is well-developed and well-nourished.  HENT:     Head: Normocephalic and atraumatic.     Mouth/Throat:     Mouth: Mucous membranes are moist.  Eyes:     Conjunctiva/sclera: Conjunctivae normal.  Cardiovascular:     Rate and Rhythm: Normal rate and regular rhythm.     Heart sounds: Normal heart sounds.  Pulmonary:     Effort: Pulmonary effort is normal. No respiratory distress.     Breath sounds: Normal breath sounds.  Abdominal:     Palpations: Abdomen is soft.     Tenderness: There is no abdominal tenderness.  Musculoskeletal:        General: No edema.     Cervical back: Neck supple.     Right lower leg: No edema.     Left lower leg: No edema.  Skin:    General: Skin is warm and dry.  Neurological:     General: No focal deficit present.     Mental Status: She is alert and oriented to person, place, and time.     Sensory: No sensory deficit.     Motor: No weakness.     Gait: Gait normal.  Psychiatric:        Mood and Affect: Mood and affect and mood normal.        Behavior: Behavior normal.      UC Treatments / Results  Labs (all labs ordered are listed, but only abnormal results are displayed) Labs Reviewed - No data to display  EKG   Radiology No results found.  Procedures Procedures (including critical care time)  Medications Ordered in UC Medications - No data to display  Initial Impression / Assessment and Plan / UC Course  I have reviewed the triage vital signs and the nursing notes.  Pertinent labs & imaging results that were available during my care of the patient were reviewed by me and considered in my medical decision making (see chart for details).   Chest pain.  Current midsternal chest pain radiating to the left breast.  EKG shows sinus rhythm, rate 87, no ST elevation, compared to  previous from 2019.  Instructed patient to go to the ED for evaluation of her chest  pain.  She states her husband drove her here and will drive her to the ED.   Final Clinical Impressions(s) / UC Diagnoses   Final diagnoses:  Chest pain, unspecified type     Discharge Instructions     Go to the emergency department for evaluation of your chest pain.        ED Prescriptions    None     PDMP not reviewed this encounter.   Mickie Bail, NP 09/27/20 2011

## 2020-09-27 NOTE — ED Triage Notes (Signed)
Patient reports central chest pain this morning , non radiating , denies SOB , no cough or fever , denies emesis or diaphoresis .

## 2020-09-27 NOTE — ED Triage Notes (Signed)
Pt states that she ate vanilla wafers around 11 am and she started having centralized chest pain that radiated under her left breast. Pt states she took an Aspirin  And Pepcid and neither medication helped.

## 2020-09-27 NOTE — Discharge Instructions (Addendum)
Go to the emergency department for evaluation of your chest pain.   

## 2020-09-28 ENCOUNTER — Encounter (HOSPITAL_COMMUNITY): Payer: Self-pay | Admitting: Emergency Medicine

## 2020-09-28 LAB — TROPONIN I (HIGH SENSITIVITY): Troponin I (High Sensitivity): <2 ng/L (ref <18)

## 2020-09-28 NOTE — ED Provider Notes (Signed)
[facility] EMERGENCY DEPARTMENT Provider Note   CSN: [id number] Arrival date & time: 09/27/20  2018     History Chief Complaint  Patient presents with  . Chest Pain    Diane Wilson is a 40 y.o. female.  The history is provided by the patient.  Chest Pain Pain location:  Substernal area Pain quality: dull   Radiates to: under left breast  Pain severity:  Moderate Onset quality:  Gradual Duration:  13 hours Timing:  Constant Progression:  Unchanged Chronicity:  New Context: at rest   Context: not eating, not movement, not stress and not trauma   Relieved by:  Nothing Worsened by:  Nothing Ineffective treatments:  Aspirin Associated symptoms: no abdominal pain, no AICD problem, no altered mental status, no anorexia, no anxiety, no back pain, no claudication, no cough, no diaphoresis, no dizziness, no dysphagia, no fatigue, no fever, no headache, no heartburn, no lower extremity edema, no nausea, no near-syncope, no numbness, no orthopnea, no palpitations, no shortness of breath, no syncope, no vomiting and no weakness   Risk factors: no aortic disease        Past Medical History:  Diagnosis Date  . Frequent headaches   . Mitral valve prolapse     Patient Active Problem List   Diagnosis Date Noted  . Routine general medical examination at a health care facility 11/24/2019  . IUD check up 11/02/2015  . Prediabetes 11/02/2015  . Mitral valve prolapse 12/18/2011    Past Surgical History:  Procedure Laterality Date  . CESAREAN SECTION    . TUBAL LIGATION       OB History    Gravida  2   Para  2   Term  2   Preterm      AB      Living  2     SAB      IAB      Ectopic      Multiple      Live Births  2           Family History  Problem Relation Age of Onset  . Cancer Father   . Diabetes Other   . Hypertension Other   . Diabetes Maternal Aunt   . Diabetes Maternal Uncle   . Diabetes Maternal Grandmother   .  Diabetes Maternal Grandfather   . Diabetes Cousin     Social History   Tobacco Use  . Smoking status: Never Smoker  . Smokeless tobacco: Never Used  Vaping Use  . Vaping Use: Never used  Substance Use Topics  . Alcohol use: No  . Drug use: No    Home Medications Prior to Admission medications   Medication Sig Start Date End Date Taking? Authorizing Provider  ibuprofen (ADVIL) 600 MG tablet Take 1 tablet (600 mg total) by mouth every 6 (six) hours as needed. 09/08/20   Mickie Bail, NP  methocarbamol (ROBAXIN) 500 MG tablet Take 1 tablet (500 mg total) by mouth 2 (two) times daily as needed for muscle spasms. 09/08/20   Mickie Bail, NP  tinidazole (TINDAMAX) 500 MG tablet Take 2 tablets (1,000 mg total) by mouth daily with breakfast. Patient not taking: No sig reported 11/22/19   Brock Bad, MD    Allergies    Codeine and Oxycontin [oxycodone hcl]  Review of Systems   Review of Systems  Constitutional: Negative for diaphoresis, fatigue and fever.  HENT: Negative for trouble swallowing.   Eyes:  Negative for visual disturbance.  Respiratory: Negative for cough and shortness of breath.   Cardiovascular: Positive for chest pain. Negative for palpitations, orthopnea, claudication, syncope and near-syncope.  Gastrointestinal: Negative for abdominal pain, anorexia, heartburn, nausea and vomiting.  Genitourinary: Negative for difficulty urinating.  Musculoskeletal: Negative for back pain.  Neurological: Negative for dizziness, weakness, numbness and headaches.  Psychiatric/Behavioral: Negative for agitation.  All other systems reviewed and are negative.   Physical Exam Updated Vital Signs BP 130/89   Pulse 86   Temp 98.9 F (37.2 C) (Oral)   Resp 17   Ht 5\' 1"  (1.549 m)   Wt 96 kg   LMP 09/10/2020   SpO2 99%   BMI 39.99 kg/m   Physical Exam Vitals and nursing note reviewed.  Constitutional:      General: She is not in acute distress.    Appearance: Normal  appearance.  HENT:     Head: Normocephalic and atraumatic.     Nose: Nose normal.  Eyes:     Conjunctiva/sclera: Conjunctivae normal.     Pupils: Pupils are equal, round, and reactive to light.  Cardiovascular:     Rate and Rhythm: Normal rate and regular rhythm.     Pulses: Normal pulses.     Heart sounds: Normal heart sounds.  Pulmonary:     Effort: Pulmonary effort is normal.     Breath sounds: Normal breath sounds.  Abdominal:     General: Abdomen is flat. Bowel sounds are normal.     Palpations: Abdomen is soft.     Tenderness: There is no abdominal tenderness. There is no guarding or rebound.  Musculoskeletal:        General: Normal range of motion.     Cervical back: Normal range of motion and neck supple.  Skin:    General: Skin is warm and dry.     Capillary Refill: Capillary refill takes less than 2 seconds.  Neurological:     General: No focal deficit present.     Mental Status: She is alert and oriented to person, place, and time.     Deep Tendon Reflexes: Reflexes normal.  Psychiatric:        Mood and Affect: Mood normal.        Behavior: Behavior normal.     ED Results / Procedures / Treatments   Labs (all labs ordered are listed, but only abnormal results are displayed) Results for orders placed or performed during the hospital encounter of 09/27/20  Basic metabolic panel  Result Value Ref Range   Sodium 135 135 - 145 mmol/L   Potassium 3.9 3.5 - 5.1 mmol/L   Chloride 102 98 - 111 mmol/L   CO2 23 22 - 32 mmol/L   Glucose, Bld 107 (H) 70 - 99 mg/dL   BUN 6 6 - 20 mg/dL   Creatinine, Ser 11/27/20 0.44 - 1.00 mg/dL   Calcium 9.2 8.9 - 1.63 mg/dL   GFR, Estimated 84.6 >65 mL/min   Anion gap 10 5 - 15  CBC  Result Value Ref Range   WBC 6.5 4.0 - 10.5 K/uL   RBC 4.47 3.87 - 5.11 MIL/uL   Hemoglobin 12.5 12.0 - 15.0 g/dL   HCT >99 35.7 - 01.7 %   MCV 87.9 80.0 - 100.0 fL   MCH 28.0 26.0 - 34.0 pg   MCHC 31.8 30.0 - 36.0 g/dL   RDW 79.3 90.3 - 00.9 %    Platelets 361 150 - 400 K/uL  nRBC 0.0 0.0 - 0.2 %  I-Stat beta hCG blood, ED  Result Value Ref Range   I-stat hCG, quantitative <5.0 <5 mIU/mL   Comment 3          Troponin I (High Sensitivity)  Result Value Ref Range   Troponin I (High Sensitivity) 3 <18 ng/L  Troponin I (High Sensitivity)  Result Value Ref Range   Troponin I (High Sensitivity) <2 <18 ng/L   DG Chest 2 View  Result Date: 09/27/2020 CLINICAL DATA:  Left-sided chest pain. EXAM: CHEST - 2 VIEW COMPARISON:  September 13, 2017 FINDINGS: The heart size and mediastinal contours are within normal limits. Both lungs are clear. The visualized skeletal structures are unremarkable. IMPRESSION: No active cardiopulmonary disease. Electronically Signed   By: Aram Candela M.D.   On: 09/27/2020 20:59    EKG EKG Interpretation  Date/Time:  Thursday September 27 2020 20:38:20 EST Ventricular Rate:  69 PR Interval:  188 QRS Duration: 76 QT Interval:  354 QTC Calculation: 379 R Axis:   26 Text Interpretation: Normal sinus rhythm Low voltage QRS Confirmed by Nicanor Alcon, Reesha Debes ([id number]) on 09/27/2020 11:14:59 PM   Radiology DG Chest 2 View  Result Date: 09/27/2020 CLINICAL DATA:  Left-sided chest pain. EXAM: CHEST - 2 VIEW COMPARISON:  September 13, 2017 FINDINGS: The heart size and mediastinal contours are within normal limits. Both lungs are clear. The visualized skeletal structures are unremarkable. IMPRESSION: No active cardiopulmonary disease. Electronically Signed   By: Aram Candela M.D.   On: 09/27/2020 20:59    Procedures Procedures   Medications Ordered in ED Medications  alum & mag hydroxide-simeth (MAALOX/MYLANTA) 200-200-20 MG/5ML suspension 30 mL (has no administration in time range)   PERC negative, wells 0, highly doubt PE in this low risk patient.  Ruled out for MI, heart score 1 low risk for MACE.    BRITANI BEATTIE was evaluated in Emergency Department on 09/28/2020 for the symptoms described in the history of  present illness. She was evaluated in the context of the global COVID-19 pandemic, which necessitated consideration that the patient might be at risk for infection with the SARS-CoV-2 virus that causes COVID-19. Institutional protocols and algorithms that pertain to the evaluation of patients at risk for COVID-19 are in a state of rapid change based on information released by regulatory bodies including the CDC and federal and state organizations. These policies and algorithms were followed during the patient's care in the ED.  ED Course  I have reviewed the triage vital signs and the nursing notes.  Pertinent labs & imaging results that were available during my care of the patient were reviewed by me and considered in my medical decision making (see chart for details).    MDM Rules/Calculators/A&P Return for intractable cough, coughing up blood, fevers >100.4 unrelieved by medication, shortness of breath, intractable vomiting, chest pain, shortness of breath, weakness, numbness, changes in speech, facial asymmetry, abdominal pain, passing out, Inability to tolerate liquids or food, cough, altered mental status or any concerns. No signs of systemic illness or infection. The patient is nontoxic-appearing on exam and vital signs are within normal limits.  I have reviewed the triage vital signs and the nursing notes. Pertinent labs & imaging results that were available during my care of the patient were reviewed by me and considered in my medical decision making (see chart for details). After history, exam, and medical workup I feel the patient has been appropriately medically screened and is safe for discharge home.  Pertinent diagnoses were discussed with the patient. Patient was given return precautions.    Libero Puthoff, MD 09/28/20 [id number]

## 2021-02-13 ENCOUNTER — Ambulatory Visit (HOSPITAL_COMMUNITY)
Admission: EM | Admit: 2021-02-13 | Discharge: 2021-02-13 | Disposition: A | Discharge status: Home / Self Care | Payer: BLUE CROSS/BLUE SHIELD | Attending: Family | Admitting: Family

## 2021-02-13 ENCOUNTER — Other Ambulatory Visit: Payer: Self-pay

## 2021-02-13 ENCOUNTER — Encounter (HOSPITAL_COMMUNITY): Payer: Self-pay

## 2021-02-13 DIAGNOSIS — M546 Pain in thoracic spine: Secondary | ICD-10-CM

## 2021-02-13 DIAGNOSIS — M549 Dorsalgia, unspecified: Secondary | ICD-10-CM

## 2021-02-13 DIAGNOSIS — R030 Elevated blood-pressure reading, without diagnosis of hypertension: Secondary | ICD-10-CM

## 2021-02-13 LAB — POC URINE PREG, ED: Preg Test, Ur: NEGATIVE

## 2021-02-13 LAB — POCT URINALYSIS DIPSTICK, ED / UC
Bilirubin Urine: NEGATIVE
Glucose, UA: NEGATIVE mg/dL
Hgb urine dipstick: NEGATIVE
Ketones, ur: NEGATIVE mg/dL
Leukocytes,Ua: NEGATIVE
Nitrite: NEGATIVE
Protein, ur: NEGATIVE mg/dL
Specific Gravity, Urine: 1.015 (ref 1.005–1.030)
Urobilinogen, UA: 1 mg/dL (ref 0.0–1.0)
pH: 7.5 (ref 5.0–8.0)

## 2021-02-13 MED ORDER — PREDNISONE 10 MG (21) PO TBPK: ORAL_TABLET | Freq: Every day | ORAL | 0 refills | Status: DC

## 2021-02-13 MED ORDER — CYCLOBENZAPRINE HCL 10 MG PO TABS: ORAL_TABLET | ORAL | 0 refills | Status: DC

## 2021-02-13 NOTE — ED Triage Notes (Signed)
Pt presents with back pain X 3 weeks that has been unrelieved with OTC medication; pt states she has noticed her urine is darker.

## 2021-02-13 NOTE — Discharge Instructions (Addendum)
Recommend start Prednisone 10mg  tablets- take 6 tablets starting tomorrow and Friday, then decrease by 1 tablet every 2 days until finished on day 12. Apply warm compresses to area for comfort. May take Flexeril 10mg  tablets- take 1 whole tablet at night for muscle pain/spasms- may also take 1/2 to 1 whole tablet during the day if needed- may cause drowsiness. Continue to monitor blood pressure and return if BP remains >160/>90. If pain gets worse, go to the ER ASAP. Otherwise, follow-up with your PCP in 4 to 5 days if minimal improvement.

## 2021-02-14 NOTE — ED Provider Notes (Signed)
[facility]    CSN: [id number] Arrival date & time: 02/13/21  1757      History   Chief Complaint Chief Complaint  Patient presents with   Back Pain    HPI Diane Wilson is a 40 y.o. female.   40 year old female presents with right sided back pain that has continued for the past 3 weeks. No distinct injury but works at a [employer] and has noticed certain movements with her right arm and trying to bend down will cause increase pain on right side of back. Also feeling slightly dizzy and nauseous due to pain. Denies any fever, numbness or radiation of pain down leg. Has noticed urine has been darker recently but denies any dysuria, hematuria, vomiting or diarrhea. Has felt more constipated but has taken doses of Miralax and had a BM last night. Also has taken Tylenol, Advil, Aleve and Robaxin with minimal relief for back pain. Recently (1 month ago) had IUD removed. No history of HTN. No other chronic health issues except mitral valve prolapse. Takes no daily medication.  The history is provided by the patient.   Past Medical History:  Diagnosis Date   Frequent headaches    Mitral valve prolapse     Patient Active Problem List   Diagnosis Date Noted   Routine general medical examination at a health care facility 11/24/2019   IUD check up 11/02/2015   Prediabetes 11/02/2015   Mitral valve prolapse 12/18/2011    Past Surgical History:  Procedure Laterality Date   CESAREAN SECTION     TUBAL LIGATION      OB History     Gravida  2   Para  2   Term  2   Preterm      AB      Living  2      SAB      IAB      Ectopic      Multiple      Live Births  2            Home Medications    Prior to Admission medications   Medication Sig Start Date End Date Taking? Authorizing Provider  cyclobenzaprine (FLEXERIL) 10 MG tablet Take 1/2 to 1 whole tablet by mouth every 8 hours as needed for muscle spasms/pain. 02/13/21  Yes Jodeci Roarty, Ali Lowe, NP   predniSONE (STERAPRED UNI-PAK 21 TAB) 10 MG (21) TBPK tablet Take by mouth daily. Take 6 tabs by mouth daily  for 2 days, then 5 tabs for 2 days, then 4 tabs for 2 days, then 3 tabs for 2 days, 2 tabs for 2 days, then 1 tab by mouth daily for 2 days 02/13/21  Yes Devory Mckinzie, Ali Lowe, NP  ibuprofen (ADVIL) 600 MG tablet Take 1 tablet (600 mg total) by mouth every 6 (six) hours as needed. 09/08/20   Mickie Bail, NP    Family History Family History  Problem Relation Age of Onset   Cancer Father    Diabetes Other    Hypertension Other    Diabetes Maternal Aunt    Diabetes Maternal Uncle    Diabetes Maternal Grandmother    Diabetes Maternal Grandfather    Diabetes Cousin     Social History Social History   Tobacco Use   Smoking status: Never   Smokeless tobacco: Never  Vaping Use   Vaping Use: Never used  Substance Use Topics   Alcohol use: No   Drug use: No  Allergies   Codeine and Oxycontin [oxycodone hcl]   Review of Systems Review of Systems  Constitutional:  Negative for activity change, appetite change, chills, diaphoresis, fatigue and fever.  Eyes:  Negative for photophobia and visual disturbance.  Respiratory:  Negative for cough, chest tightness and shortness of breath.   Cardiovascular:  Negative for chest pain.  Gastrointestinal:  Negative for abdominal pain, nausea and vomiting.  Genitourinary:  Positive for decreased urine volume (darker color of urine) and flank pain (right). Negative for difficulty urinating, dysuria, frequency, hematuria, urgency and vaginal discharge.  Musculoskeletal:  Positive for back pain and myalgias. Negative for gait problem, neck pain and neck stiffness.  Skin:  Negative for color change and rash.  Allergic/Immunologic: Negative for environmental allergies, food allergies and immunocompromised state.  Neurological:  Positive for light-headedness. Negative for dizziness, tremors, seizures, syncope, facial asymmetry, speech  difficulty, weakness and numbness.  Hematological:  Negative for adenopathy. Does not bruise/bleed easily.    Physical Exam Triage Vital Signs ED Triage Vitals  Enc Vitals Group     BP 02/13/21 1916 (!) 178/96     Pulse Rate 02/13/21 1916 94     Resp 02/13/21 1916 18     Temp 02/13/21 1916 98.6 F (37 C)     Temp Source 02/13/21 1916 Oral     SpO2 02/13/21 1916 98 %     Weight --      Height --      Head Circumference --      Peak Flow --      Pain Score 02/13/21 1918 8     Pain Loc --      Pain Edu? --      Excl. in GC? --    No data found.  Updated Vital Signs BP (!) 178/96 (BP Location: Right Arm)   Pulse 94   Temp 98.6 F (37 C) (Oral)   Resp 18   SpO2 98%   Visual Acuity Right Eye Distance:   Left Eye Distance:   Bilateral Distance:    Right Eye Near:   Left Eye Near:    Bilateral Near:     Physical Exam Vitals and nursing note reviewed.  Constitutional:      General: She is awake. She is not in acute distress.    Appearance: She is well-developed and well-groomed.     Comments: She is sitting on the exam table in no acute distress but appears uncomfortable when changing positions due to pain.   HENT:     Head: Normocephalic and atraumatic.     Right Ear: Hearing normal.     Left Ear: Hearing normal.  Eyes:     Extraocular Movements: Extraocular movements intact.     Conjunctiva/sclera: Conjunctivae normal.  Cardiovascular:     Rate and Rhythm: Normal rate and regular rhythm.     Heart sounds: Normal heart sounds.  Pulmonary:     Effort: Pulmonary effort is normal. No prolonged expiration or respiratory distress.     Breath sounds: Normal breath sounds and air entry. No decreased air movement. No decreased breath sounds, wheezing, rhonchi or rales.  Abdominal:     Tenderness: There is right CVA tenderness. There is no left CVA tenderness.  Musculoskeletal:        General: Tenderness present.     Cervical back: Normal, normal range of motion and  neck supple. No rigidity or tenderness. Normal range of motion.     Thoracic back: Spasms and tenderness present.  No swelling. Normal range of motion.     Lumbar back: No tenderness. Normal range of motion.       Back:     Comments: Has full range of motion of thoracic and lumbar areas of back but pain with flexion and rotation to the left. Pain in mid to upper thoracic area on the right when raising and extending right arm. Tender along mid thoracic and lower lumbar muscles with occasional spasms present. No rash or redness. No neuro deficits noted.   Skin:    General: Skin is warm and dry.     Capillary Refill: Capillary refill takes less than 2 seconds.     Findings: No rash.  Neurological:     General: No focal deficit present.     Mental Status: She is alert and oriented to person, place, and time.     Sensory: Sensation is intact. No sensory deficit.     Motor: Motor function is intact.     Gait: Gait is intact.  Psychiatric:        Mood and Affect: Mood normal.        Behavior: Behavior normal. Behavior is cooperative.        Thought Content: Thought content normal.        Judgment: Judgment normal.     UC Treatments / Results  Labs (all labs ordered are listed, but only abnormal results are displayed) Labs Reviewed  POCT URINALYSIS DIPSTICK, ED / UC  POC URINE PREG, ED    EKG   Radiology No results found.  Procedures Procedures (including critical care time)  Medications Ordered in UC Medications - No data to display  Initial Impression / Assessment and Plan / UC Course  I have reviewed the triage vital signs and the nursing notes.  Pertinent labs & imaging results that were available during my care of the patient were reviewed by me and considered in my medical decision making (see chart for details).     Reviewed negative urine pregnancy test with patient. Also reviewed negative urinalysis- doubt UTI or renal calculi. Discussed that she most likely has a  thoracic and upper lumbar muscle strain that continues to be aggravated by activities at work. Also discussed elevated blood pressure- probably due to pain level but need to continue to monitor. May be reason for slight dizziness, nausea and any headaches. May trial Prednisone 10mg  12 day dose pack as directed. Discussed this medication can increase blood pressure so need to monitor BP in community a few times over the next week. If BP remains >160/>90, return for further evaluation. May apply warm compresses to area for comfort. May trial Flexeril 10mg - take 1 whole tablet at night- may take 1/2 tablet during the day for pain/spasms. If pain gets worse, go to the ER ASAP. Otherwise, follow-up with her PCP in 4 to 5 days if minimal improvement.  Final Clinical Impressions(s) / UC Diagnoses   Final diagnoses:  Acute right-sided thoracic back pain  Acute mid back pain  Elevated blood pressure reading in office without diagnosis of hypertension     Discharge Instructions      Recommend start Prednisone 10mg  tablets- take 6 tablets starting tomorrow and Friday, then decrease by 1 tablet every 2 days until finished on day 12. Apply warm compresses to area for comfort. May take Flexeril 10mg  tablets- take 1 whole tablet at night for muscle pain/spasms- may also take 1/2 to 1 whole tablet during the day if needed- may cause  drowsiness. Continue to monitor blood pressure and return if BP remains >160/>90. If pain gets worse, go to the ER ASAP. Otherwise, follow-up with your PCP in 4 to 5 days if minimal improvement.      ED Prescriptions     Medication Sig Dispense Auth. Provider   predniSONE (STERAPRED UNI-PAK 21 TAB) 10 MG (21) TBPK tablet Take by mouth daily. Take 6 tabs by mouth daily  for 2 days, then 5 tabs for 2 days, then 4 tabs for 2 days, then 3 tabs for 2 days, 2 tabs for 2 days, then 1 tab by mouth daily for 2 days 42 tablet Jenae Tomasello, Ali Lowe, NP   cyclobenzaprine (FLEXERIL) 10 MG tablet  Take 1/2 to 1 whole tablet by mouth every 8 hours as needed for muscle spasms/pain. 15 tablet Markell Schrier, Ali Lowe, NP      PDMP not reviewed this encounter.   Sudie Grumbling, NP 02/14/21 [phone]

## 2021-03-21 ENCOUNTER — Encounter (HOSPITAL_COMMUNITY): Payer: Self-pay | Admitting: Emergency Medicine

## 2021-03-21 ENCOUNTER — Ambulatory Visit (HOSPITAL_COMMUNITY)
Admission: EM | Admit: 2021-03-21 | Discharge: 2021-03-21 | Disposition: A | Discharge status: Home / Self Care | Payer: BLUE CROSS/BLUE SHIELD

## 2021-03-21 ENCOUNTER — Other Ambulatory Visit: Payer: Self-pay

## 2021-03-21 DIAGNOSIS — T50905A Adverse effect of unspecified drugs, medicaments and biological substances, initial encounter: Secondary | ICD-10-CM | POA: Diagnosis not present

## 2021-03-21 DIAGNOSIS — M7989 Other specified soft tissue disorders: Secondary | ICD-10-CM

## 2021-03-21 MED ORDER — FUROSEMIDE 20 MG PO TABS: 20.0000 mg | ORAL_TABLET | Freq: Every day | ORAL | 0 refills | Status: DC

## 2021-03-21 MED ORDER — MELOXICAM 15 MG PO TABS: 15.0000 mg | ORAL_TABLET | Freq: Every day | ORAL | 1 refills | Status: DC

## 2021-03-21 NOTE — Discharge Instructions (Addendum)
Most likely a drug reaction to the medicine diclofenac, please do not take this medicine anymore, and consider an allergy  You can take meloxicam once every morning with food to help relief you are planning to replace diclofenac  For the next 3 days take furosemide once in the morning, this medication will make you urinate frequently and urgently, if swelling is is improvement but still present after 3 days, you may take the 2 remaining doses as prescribed daily   Please follow-up at any point if symptoms begin to worsen, spread or persist at urgent care by primary doctor

## 2021-03-21 NOTE — ED Triage Notes (Signed)
Reports bilateral hands and feet swelling since yesterday morning.  Denies pain.  On Saturday [facility].  Reports was prescribed diclofenac.  Noticed swelling yesterday.  Has not taken any medications today.

## 2021-03-23 NOTE — ED Provider Notes (Signed)
[facility]    CSN: [id number] Arrival date & time: 03/21/21  1930      History   Chief Complaint Chief Complaint  Patient presents with   Edema    HPI Diane Wilson is a 40 y.o. female.   Patient presents with bilateral hands and foot swelling for 1 day. ROM intact. Denies shortness of breath, chest pain or tightness, difficulty swallowing, itchy throat, cough, wheezing, fatigue, abdominal pain. Denies numbness, tingling. ROM intact. Started taking diclofenac 5 days ago for back pain. Last dose yesterday evening. History of mitral valve prolapse, monitored by PCP, considered stable.   Past Medical History:  Diagnosis Date   Frequent headaches    Mitral valve prolapse     Patient Active Problem List   Diagnosis Date Noted   Routine general medical examination at a health care facility 11/24/2019   IUD check up 11/02/2015   Prediabetes 11/02/2015   Mitral valve prolapse 12/18/2011    Past Surgical History:  Procedure Laterality Date   CESAREAN SECTION     TUBAL LIGATION      OB History     Gravida  2   Para  2   Term  2   Preterm      AB      Living  2      SAB      IAB      Ectopic      Multiple      Live Births  2            Home Medications    Prior to Admission medications   Medication Sig Start Date End Date Taking? Authorizing Provider  furosemide (LASIX) 20 MG tablet Take 1 tablet (20 mg total) by mouth daily. 03/21/21  Yes Dezzie Badilla, Elita Boone, NP  meloxicam (MOBIC) 15 MG tablet Take 1 tablet (15 mg total) by mouth daily. 03/21/21  Yes Romelle Muldoon R, NP  cyclobenzaprine (FLEXERIL) 10 MG tablet Take 1/2 to 1 whole tablet by mouth every 8 hours as needed for muscle spasms/pain. Patient not taking: Reported on 03/21/2021 02/13/21   Sudie Grumbling, NP  ibuprofen (ADVIL) 600 MG tablet Take 1 tablet (600 mg total) by mouth every 6 (six) hours as needed. 09/08/20   Mickie Bail, NP  predniSONE (STERAPRED UNI-PAK 21 TAB) 10  MG (21) TBPK tablet Take by mouth daily. Take 6 tabs by mouth daily  for 2 days, then 5 tabs for 2 days, then 4 tabs for 2 days, then 3 tabs for 2 days, 2 tabs for 2 days, then 1 tab by mouth daily for 2 days Patient not taking: Reported on 03/21/2021 02/13/21   Sudie Grumbling, NP    Family History Family History  Problem Relation Age of Onset   Cancer Father    Diabetes Maternal Grandmother    Diabetes Maternal Grandfather    Diabetes Maternal Aunt    Diabetes Maternal Uncle    Diabetes Cousin    Diabetes Other    Hypertension Other     Social History Social History   Tobacco Use   Smoking status: Never   Smokeless tobacco: Never  Vaping Use   Vaping Use: Never used  Substance Use Topics   Alcohol use: No   Drug use: No     Allergies   Codeine and Oxycontin [oxycodone hcl]   Review of Systems Review of Systems  Constitutional: Negative.   Respiratory: Negative.    Cardiovascular:  Positive for leg swelling. Negative for chest pain and palpitations.  Gastrointestinal: Negative.   Skin: Negative.   Neurological: Negative.     Physical Exam Triage Vital Signs ED Triage Vitals  Enc Vitals Group     BP 03/21/21 1941 132/84     Pulse Rate 03/21/21 1941 63     Resp --      Temp 03/21/21 1941 99.8 F (37.7 C)     Temp Source 03/21/21 1941 Oral     SpO2 03/21/21 1941 100 %     Weight --      Height --      Head Circumference --      Peak Flow --      Pain Score 03/21/21 1947 0     Pain Loc --      Pain Edu? --      Excl. in GC? --    No data found.  Updated Vital Signs BP 132/84 (BP Location: Right Arm)   Pulse 63   Temp 99.8 F (37.7 C) (Oral)   LMP 03/20/2021   SpO2 100%   Visual Acuity Right Eye Distance:   Left Eye Distance:   Bilateral Distance:    Right Eye Near:   Left Eye Near:    Bilateral Near:     Physical Exam Constitutional:      Appearance: Normal appearance. She is normal weight.  Eyes:     Extraocular Movements:  Extraocular movements intact.  Cardiovascular:     Rate and Rhythm: Normal rate and regular rhythm.     Pulses: Normal pulses.     Heart sounds: Normal heart sounds.  Pulmonary:     Effort: Pulmonary effort is normal.     Breath sounds: Normal breath sounds.  Musculoskeletal:     Cervical back: Normal range of motion and neck supple.     Comments: Bilateral non pitting swelling in hands and feet, ROM intact , 2+ bilateral radial pulse and pedal pulses,   Neurological:     Mental Status: She is alert and oriented to person, place, and time. Mental status is at baseline.  Psychiatric:        Mood and Affect: Mood normal.        Behavior: Behavior normal.     UC Treatments / Results  Labs (all labs ordered are listed, but only abnormal results are displayed) Labs Reviewed - No data to display  EKG   Radiology No results found.  Procedures Procedures (including critical care time)  Medications Ordered in UC Medications - No data to display  Initial Impression / Assessment and Plan / UC Course  I have reviewed the triage vital signs and the nursing notes.  Pertinent labs & imaging results that were available during my care of the patient were reviewed by me and considered in my medical decision making (see chart for details).  Adverse effect of drug Bilateral hand swelling Bilateral swelling of feet  Advised to stop use of diclofenac, added to allergies list  Meloxicam 15 mg daily, discussed potential of adverse reaction, patient has tolerated other NSAIDs Furosemide 20 mg daily for 3 days, if improvement seen but swelling still present may complete two additional days of medication. Kidney function reviewed Return precautions given for non improvement of worsening symptoms in 72 hours   Final Clinical Impressions(s) / UC Diagnoses   Final diagnoses:  Adverse effect of drug, initial encounter  Bilateral hand swelling     Discharge Instructions  Most likely  a drug reaction to the medicine diclofenac, please do not take this medicine anymore, and consider an allergy  You can take meloxicam once every morning with food to help relief you are planning to replace diclofenac  For the next 3 days take furosemide once in the morning, this medication will make you urinate frequently and urgently, if swelling is is improvement but still present after 3 days, you may take the 2 remaining doses as prescribed daily   Please follow-up at any point if symptoms begin to worsen, spread or persist at urgent care by primary doctor   ED Prescriptions     Medication Sig Dispense Auth. Provider   furosemide (LASIX) 20 MG tablet Take 1 tablet (20 mg total) by mouth daily. 5 tablet Tiago Humphrey R, NP   meloxicam (MOBIC) 15 MG tablet Take 1 tablet (15 mg total) by mouth daily. 30 tablet Valinda Hoar, NP      PDMP not reviewed this encounter.   Valinda Hoar, [place] 03/23/21 [phone]

## 2021-11-19 ENCOUNTER — Other Ambulatory Visit (HOSPITAL_BASED_OUTPATIENT_CLINIC_OR_DEPARTMENT_OTHER): Payer: Self-pay | Admitting: Nurse Practitioner

## 2021-11-19 DIAGNOSIS — N939 Abnormal uterine and vaginal bleeding, unspecified: Secondary | ICD-10-CM

## 2021-11-23 ENCOUNTER — Ambulatory Visit (HOSPITAL_BASED_OUTPATIENT_CLINIC_OR_DEPARTMENT_OTHER)
Admission: RE | Admit: 2021-11-23 | Discharge: 2021-11-23 | Disposition: A | Discharge status: Home / Self Care | Payer: 59 | Source: Ambulatory Visit | Attending: Nurse Practitioner | Admitting: Nurse Practitioner

## 2021-11-23 DIAGNOSIS — N939 Abnormal uterine and vaginal bleeding, unspecified: Secondary | ICD-10-CM | POA: Diagnosis present

## 2022-03-14 IMAGING — DX DG CHEST 2V
2 series · 2 of 2 positions shown · non-contrast
Comparison: September 13, 2017

CLINICAL DATA: Left-sided chest pain.

EXAM:
CHEST - 2 VIEW

[w chest pa]
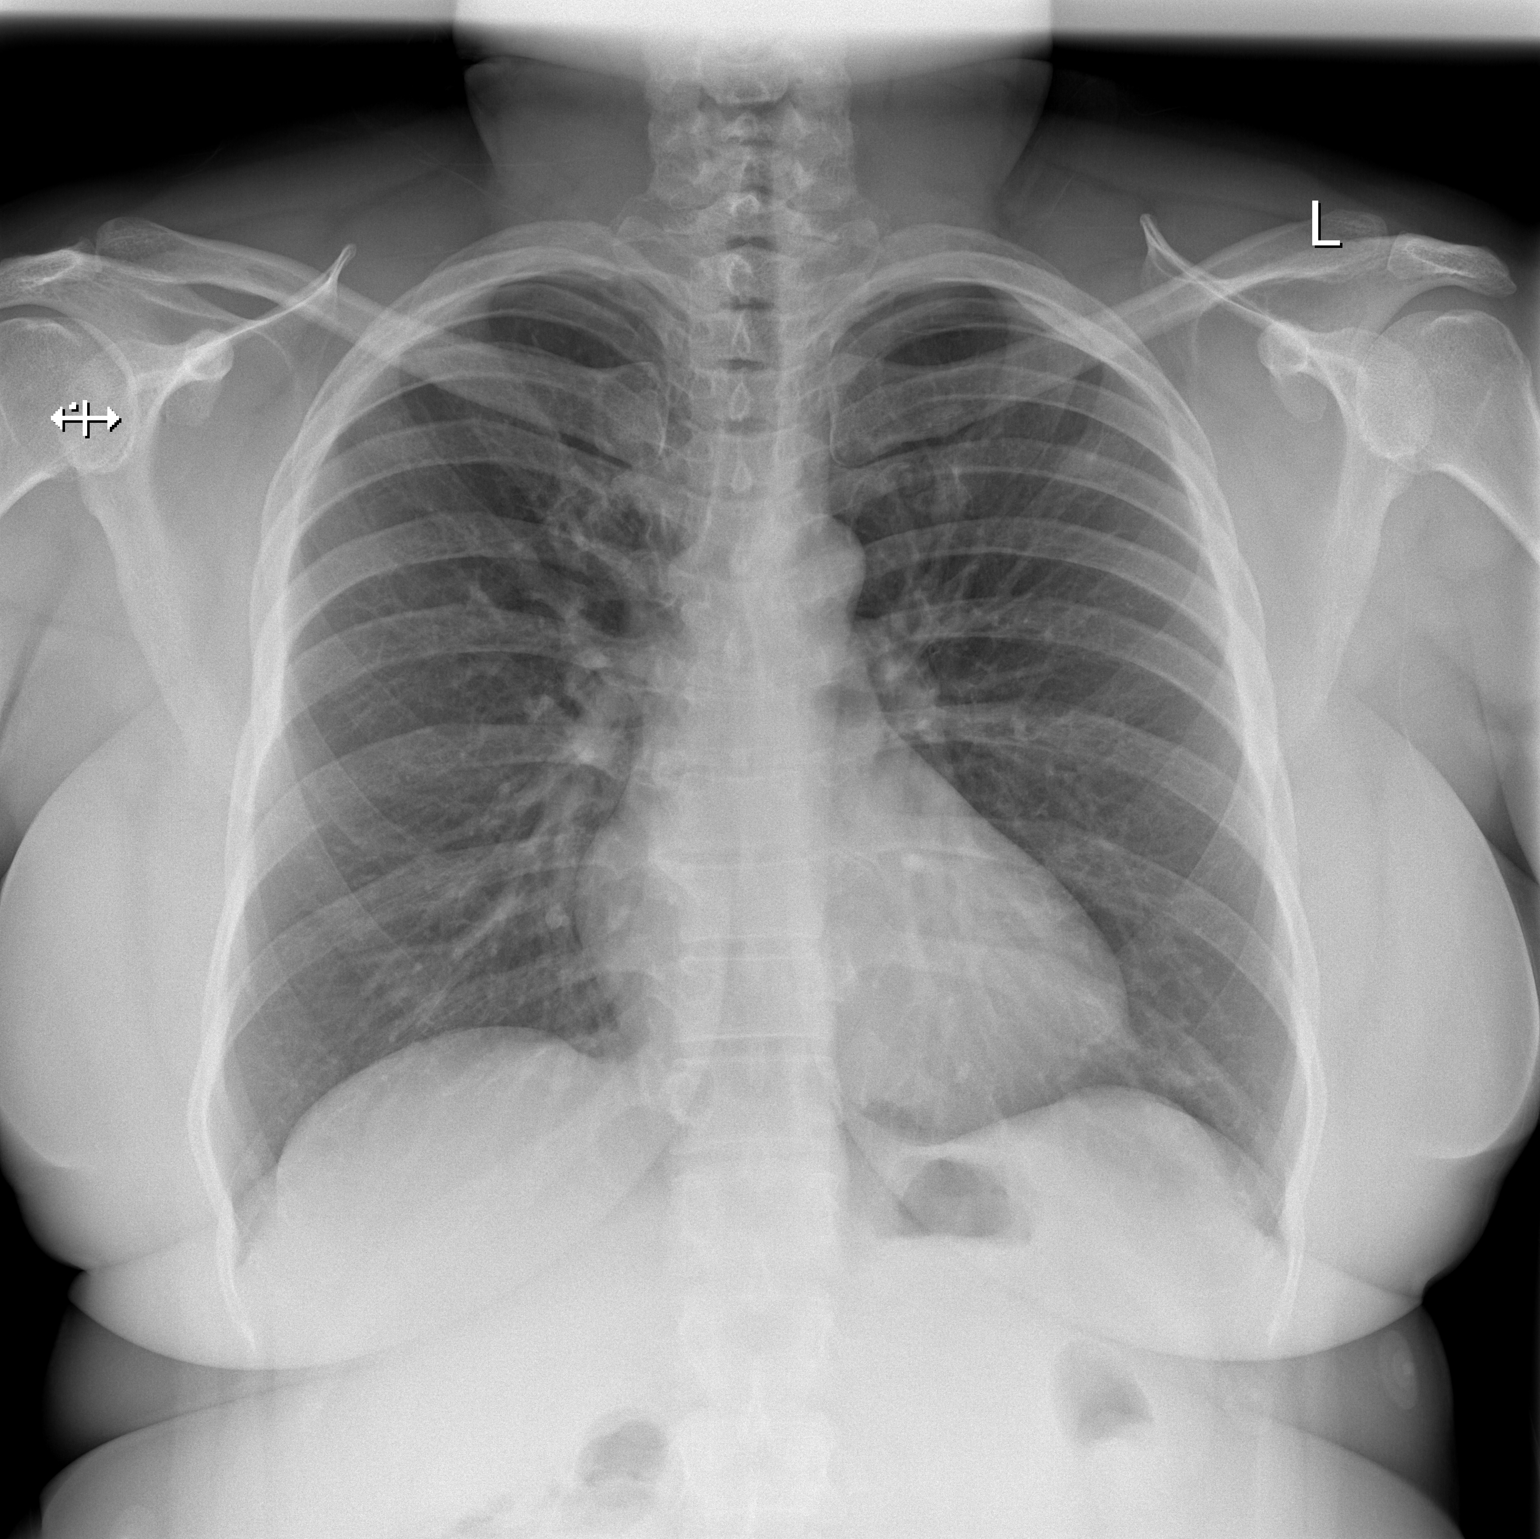

[w chest lat]
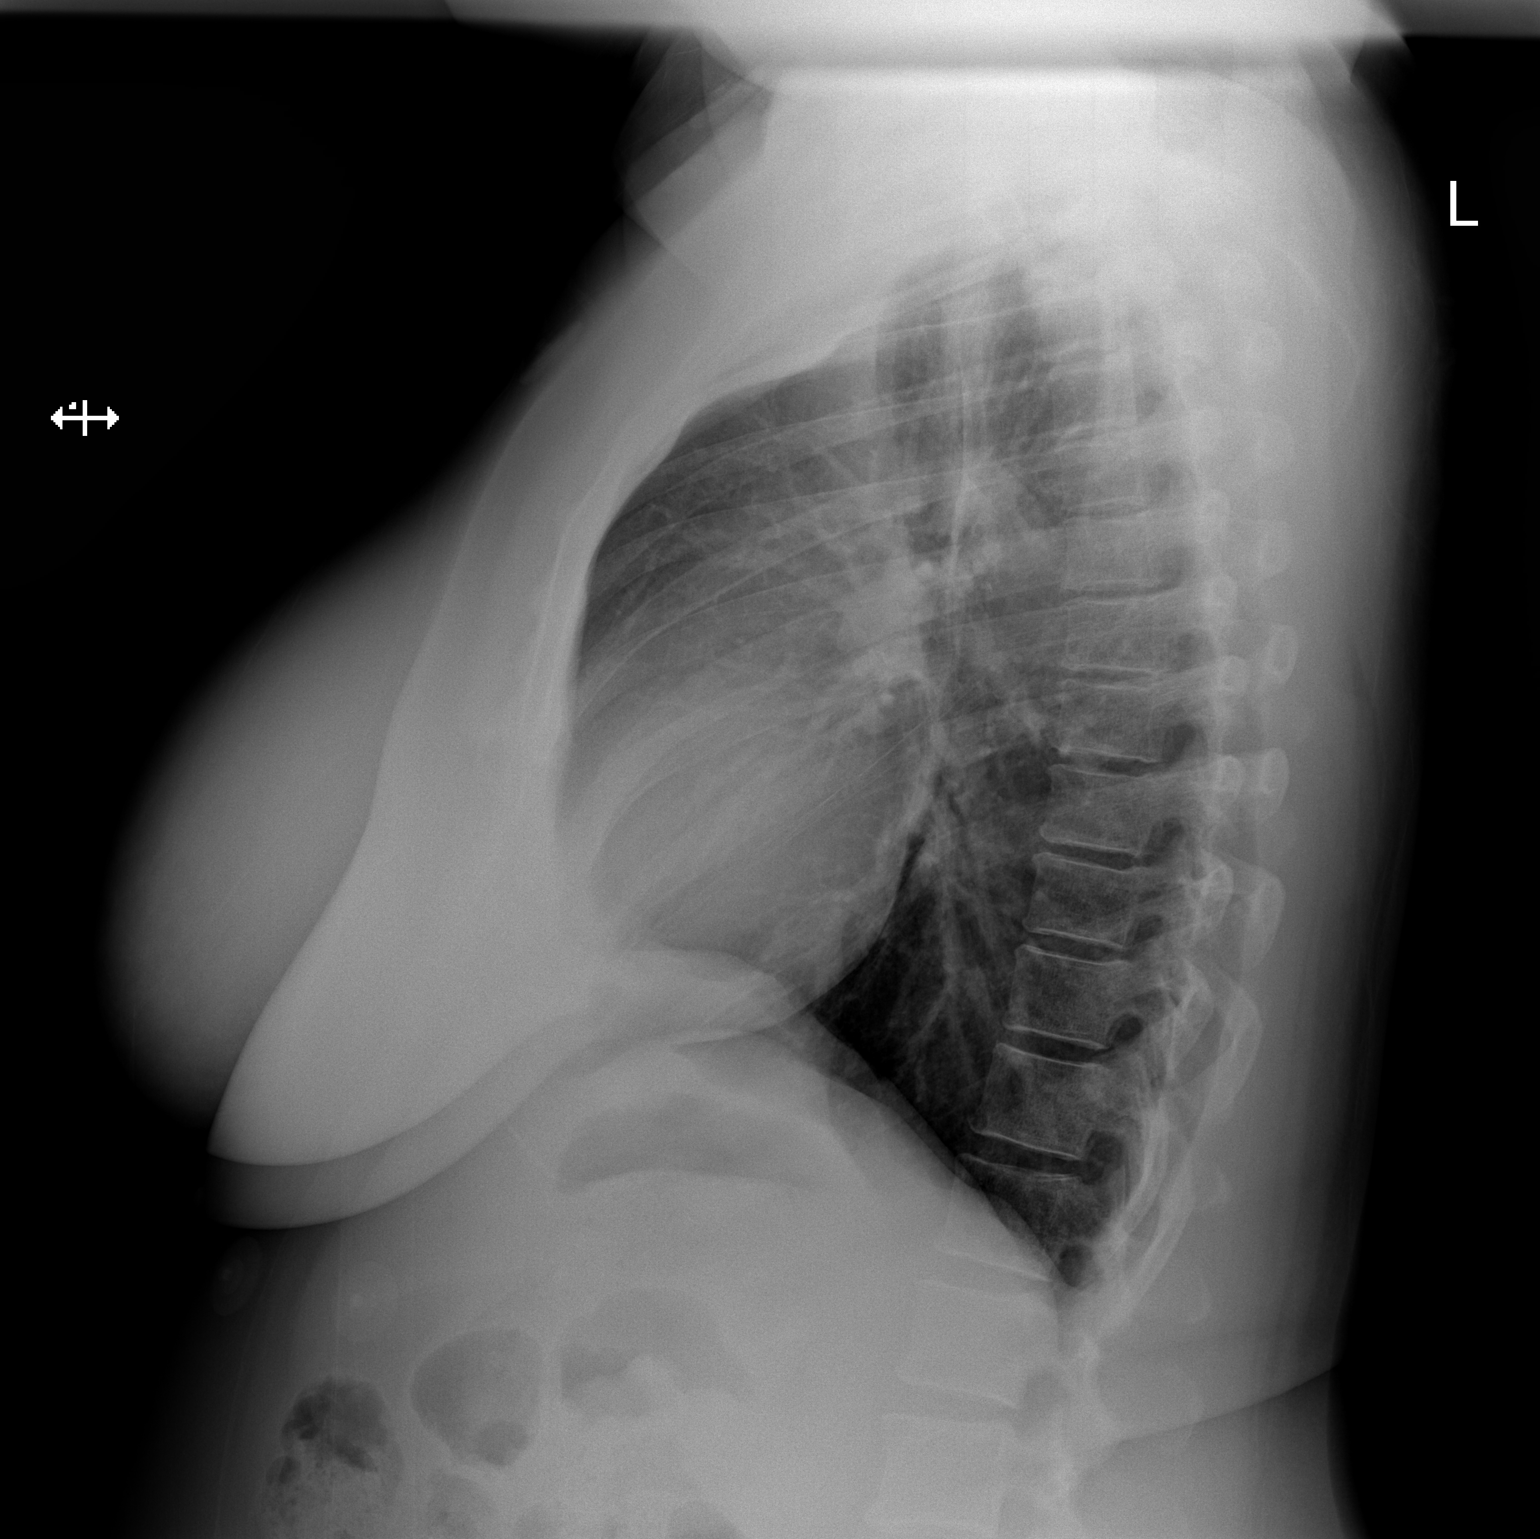

[2 of 2 positions shown; findings below may reference images not displayed]

FINDINGS: The heart size and mediastinal contours are within normal limits.
Both lungs are clear. The visualized skeletal structures are
unremarkable.
IMPRESSION: No active cardiopulmonary disease.

## 2022-03-17 ENCOUNTER — Encounter: Payer: Self-pay | Admitting: Obstetrics and Gynecology

## 2022-03-17 ENCOUNTER — Ambulatory Visit: Payer: Commercial Managed Care - HMO | Admitting: Obstetrics and Gynecology

## 2022-03-17 VITALS — BP 118/78 | HR 89 | Resp 18 | Ht 63.98 in | Wt 202.4 lb

## 2022-03-17 DIAGNOSIS — Z8742 Personal history of other diseases of the female genital tract: Secondary | ICD-10-CM | POA: Diagnosis not present

## 2022-03-17 DIAGNOSIS — Z862 Personal history of diseases of the blood and blood-forming organs and certain disorders involving the immune mechanism: Secondary | ICD-10-CM

## 2022-03-17 DIAGNOSIS — N921 Excessive and frequent menstruation with irregular cycle: Secondary | ICD-10-CM

## 2022-03-17 NOTE — Patient Instructions (Signed)
Endometrial Biopsy  An endometrial biopsy is a procedure to remove tissue samples from the endometrium, which is the lining of the uterus. The tissue that is removed can then be checked under a microscope for disease. This procedure is used to diagnose conditions such as endometrial cancer, endometrial tuberculosis, polyps, or other inflammatory conditions. This procedure may also be used to investigate uterine bleeding to determine where you are in your menstrual cycle or how your hormone levels are affecting the lining of the uterus. Tell a health care provider about: Any allergies you have. All medicines you are taking, including vitamins, herbs, eye drops, creams, and over-the-counter medicines. Any problems you or family members have had with anesthetic medicines. Any bleeding problems you have. Any surgeries you have had. Any medical conditions you have. Whether you are pregnant or may be pregnant. What are the risks? Your health care provider will talk with you about risks. These may include: Bleeding. Pelvic infection. Puncture of the wall of the uterus with the biopsy device (rare). Allergic reactions to medicines. What happens before the procedure? Keep a record of your menstrual cycles as told by your health care provider. You may need to schedule your procedure for a specific time in your cycle. Bring a sanitary pad in case you need to wear one after the procedure. Ask your health care provider about: Changing or stopping your regular medicines. These include any diabetes medicines or blood thinners you take. Taking medicines such as aspirin and ibuprofen. These medicines can thin your blood. Do not take these medicines unless your health care provider tells you to. Taking over-the-counter medicines, vitamins, herbs, and supplements. Plan to have someone take you home from the hospital or clinic. What happens during the procedure? You will lie on an exam table with your feet  and legs supported as in a pelvic exam. Your health care provider will insert an instrument into your vagina to see your cervix. Your cervix will be cleansed with an antiseptic solution. A medicine (local anesthetic) will be used to numb the cervix. A forceps instrument will be used to hold your cervix steady for the biopsy. A thin, rod-like instrument (uterine sound) will be inserted through your cervix to determine the length of your uterus and the location where the biopsy sample will be removed. A thin, flexible tube (catheter) will be inserted through your cervix and into the uterus. The catheter will be used to collect the biopsy sample from your endometrial tissue. The tube and instruments will be removed, and the tissue sample will be sent to a lab for examination. The procedure may vary among health care providers and hospitals. What happens after the procedure? Your blood pressure, heart rate, breathing rate, and blood oxygen level will be monitored until you leave the hospital or clinic. It is up to you to get the results of your procedure. Ask your health care provider, or the department that is doing the procedure, when your results will be ready. Summary An endometrial biopsy is a procedure to remove tissue samples from the endometrium, which is the lining of the uterus. This procedure is used to diagnose conditions such as endometrial cancer, endometrial tuberculosis, polyps, or other inflammatory conditions. It is up to you to get the results of your procedure. Ask your health care provider, or the department that is doing the procedure, when your results will be ready. This information is not intended to replace advice given to you by your health care provider. Make sure you  discuss any questions you have with your health care provider. Document Revised: 10/29/2021 Document Reviewed: 10/29/2021 Elsevier Patient Education  2023 [employer]   Endometrial Ablation Endometrial  ablation is a procedure that destroys the thin inner layer of the lining of the uterus (endometrium). This procedure may be done: To stop heavy menstrual periods. To stop bleeding that is causing anemia. To control irregular bleeding. To treat bleeding caused by small tumors (fibroids) in the endometrium. This procedure is often done as an alternative to major surgery, such as removal of the uterus and cervix (hysterectomy). As a result of this procedure: You may not be able to have children. However, if you have not yet gone through menopause: You may still have a small chance of getting pregnant. You will need to use a reliable method of birth control after the procedure to prevent pregnancy. You may stop having a menstrual period, or you may have only a small amount of bleeding during your period. Menstruation may return several years after the procedure. Tell a health care provider about: Any allergies you have. All medicines you are taking, including vitamins, herbs, eye drops, creams, and over-the-counter medicines. Any problems you or family members have had with the use of anesthetic medicines. Any blood disorders you have. Any surgeries you have had. Any medical conditions you have. Whether you are pregnant or may be pregnant. What are the risks? Generally, this is a safe procedure. However, problems may occur, including: A hole (perforation) in the uterus or bowel. Infection in the uterus, bladder, or vagina. Bleeding. Allergic reaction to medicines. Damage to nearby structures or organs. An air bubble in the lung (air embolus). Problems with pregnancy. Failure of the procedure. Decreased ability to diagnose cancer in the endometrium. Scar tissue forms after the procedure, making it more difficult to get a sample of the uterine lining. What happens before the procedure? Medicines Ask your health care provider about: Changing or stopping your regular medicines. This is  especially important if you take diabetes medicines or blood thinners. Taking medicines such as aspirin and ibuprofen. These medicines can thin your blood. Do not take these medicines before your procedure if your doctor tells you not to take them. Taking over-the-counter medicines, vitamins, herbs, and supplements. Tests You will have tests of your endometrium to make sure there are no precancerous cells or cancer cells present. You may have an ultrasound of the uterus. General instructions Do not use any products that contain nicotine or tobacco for at least 4 weeks before the procedure. These include cigarettes, chewing tobacco, and vaping devices, such as e-cigarettes. If you need help quitting, ask your health care provider. You may be given medicines to thin the endometrium. Ask your health care provider what steps will be taken to help prevent infection. These steps may include: Removing hair at the surgery site. Washing skin with a germ-killing soap. Taking antibiotic medicine. Plan to have a responsible adult take you home from the hospital or clinic. Plan to have a responsible adult care for you for the time you are told after you leave the hospital or clinic. This is important. What happens during the procedure?  You will lie on an exam table with your feet and legs supported as in a pelvic exam. An IV will be inserted into one of your veins. You will be given a medicine to help you relax (sedative). A surgical tool with a light and camera (resectoscope) will be inserted into your vagina and moved into  your uterus. This allows your surgeon to see inside your uterus. Endometrial tissue will be destroyed and removed, using one of the following methods: Radiofrequency. This uses an electrical current to destroy the endometrium. Cryotherapy. This uses extreme cold to freeze the endometrium. Heated fluid. This uses a heated salt and water (saline) solution to destroy the  endometrium. Microwave. This uses high-energy microwaves to heat up the endometrium and destroy it. Thermal balloon. This involves inserting a catheter with a balloon tip into the uterus. The balloon tip is filled with heated fluid to destroy the endometrium. The procedure may vary among health care providers and hospitals. What happens after the procedure? Your blood pressure, heart rate, breathing rate, and blood oxygen level will be monitored until you leave the hospital or clinic. You may have vaginal bleeding for 4-6 weeks after the procedure. You may also have: Cramps. A thin, watery vaginal discharge that is light pink or brown. A need to urinate more than usual. Nausea. If you were given a sedative during the procedure, it can affect you for several hours. Do not drive or operate machinery until your health care provider says that it is safe. Do not have sex or insert anything into your vagina until your health care provider says it is safe. Summary Endometrial ablation is done to treat many causes of heavy menstrual bleeding. The procedure destroys the thin inner layer of the lining of the uterus (endometrium). This procedure is often done as an alternative to major surgery, such as removal of the uterus and cervix (hysterectomy). Plan to have a responsible adult take you home from the hospital or clinic. This information is not intended to replace advice given to you by your health care provider. Make sure you discuss any questions you have with your health care provider. Document Revised: 02/02/2020 Document Reviewed: 02/02/2020 Elsevier Patient Education  2023 [employer]   Tranexamic Acid Tablets What is this medication? TRANEXAMIC ACID (TRAN ex AM ik AS id) treats heavy periods. It works by preventing blood clots from breaking down too quickly. This helps your blood clot normally, which reduces blood loss. This medicine may be used for other purposes; ask your health care  provider or pharmacist if you have questions. COMMON BRAND NAME(S): Cyklokapron, Lysteda What should I tell my care team before I take this medication? They need to know if you have any of these conditions: Bleeding in the brain Blood clotting problems Kidney disease Vision problems An unusual allergic reaction to tranexamic acid, other medications, foods, dyes, or preservatives Pregnant or trying to get pregnant Breast-feeding How should I use this medication? Take this medication by mouth with water. Take it as directed on the prescription label at the same time every day. Do not cut, crush, or chew this medication. Swallow the tablets whole. You can take it with or without food. If it upsets your stomach, take it with food. Do not take this medication until your period has started. Do not take it for more than 5 days in a row. Do not take this medication when you do not have your period. Talk to your care team about the use of this medication in children. While it may be prescribed for children as young as 12 years for selected conditions, precautions do apply. Overdosage: If you think you have taken too much of this medicine contact a poison control center or emergency room at once. NOTE: This medicine is only for you. Do not share this medicine with others.  What if I miss a dose? If you miss a dose, take it as soon as you remember. Then, take your next dose at least 6 hours later. Do not take double or extra doses. What may interact with this medication? Do not take this medication with any of the following: Estrogen and progestin hormones This medication may also interact with the following: Certain medications that prevent or treat blood clots Tretinoin This list may not describe all possible interactions. Give your health care provider a list of all the medicines, herbs, non-prescription drugs, or dietary supplements you use. Also tell them if you smoke, drink alcohol, or use illegal  drugs. Some items may interact with your medicine. What should I watch for while using this medication? Visit your care team for regular checks on your progress. Tell your care team if your symptoms do not start to get better or if they get worse. This medication can cause serious eye damage. Tell your care team right away if you have changes in your eyesight. What side effects may I notice from receiving this medication? Side effects that you should report to your care team as soon as possible: Allergic reactions--skin rash, itching, hives, swelling of the face, lips, tongue, or throat Blood clot--pain, swelling, or warmth in the leg, shortness of breath, chest pain Change in vision such as blurry vision, seeing halos around lights, vision loss Stroke--sudden numbness or weakness of the face, arm, or leg, trouble speaking, confusion, trouble walking, loss of balance or coordination, dizziness, severe headache, change in vision Side effects that usually do not require medical attention (report to your care team if they continue or are bothersome): Bone, joint, or muscle pain Fatigue Headache Runny or stuffy nose Stomach pain This list may not describe all possible side effects. Call your doctor for medical advice about side effects. You may report side effects to FDA at 1-800-FDA-1088. Where should I keep my medication? Keep out of the reach of children and pets. Store at room temperature between 20 and 25 degrees C (68 and 77 degrees F). Get rid of any unused medication after the expiration date. To get rid of medications that are no longer needed or have expired: Take the medication to a medication take-back program. Check with your pharmacy or law enforcement to find a location. If you cannot return the medication, check the label or package insert to see if the medication should be thrown out in the garbage or flushed down the toilet. If you are not sure, ask your care team. If it is safe  to put it in the trash, take the medication out of the container. Mix the medication with cat litter, dirt, coffee grounds, or other unwanted substance. Seal the mixture in a bag or container. Put it in the trash. NOTE: This sheet is a summary. It may not cover all possible information. If you have questions about this medicine, talk to your doctor, pharmacist, or health care provider.  2023 Elsevier/Gold Standard (2008-06-19 00:00:00)   Contraception Choices Contraception, also called birth control, refers to methods or devices that prevent pregnancy. Hormonal methods  Contraceptive implant A contraceptive implant is a thin, plastic tube that contains a hormone that prevents pregnancy. It is different from an intrauterine device (IUD). It is inserted into the upper part of the arm by a health care provider. Implants can be effective for up to 3 years. Progestin-only injections Progestin-only injections are injections of progestin, a synthetic form of the hormone progesterone. They are  given every 3 months by a health care provider. Birth control pills Birth control pills are pills that contain hormones that prevent pregnancy. They must be taken once a day, preferably at the same time each day. A prescription is needed to use this method of contraception. Birth control patch The birth control patch contains hormones that prevent pregnancy. It is placed on the skin and must be changed once a week for three weeks and removed on the fourth week. A prescription is needed to use this method of contraception. Vaginal ring A vaginal ring contains hormones that prevent pregnancy. It is placed in the vagina for three weeks and removed on the fourth week. After that, the process is repeated with a new ring. A prescription is needed to use this method of contraception. Emergency contraceptive Emergency contraceptives prevent pregnancy after unprotected sex. They come in pill form and can be taken up to 5  days after sex. They work best the sooner they are taken after having sex. Most emergency contraceptives are available without a prescription. This method should not be used as your only form of birth control. Barrier methods  Female condom A female condom is a thin sheath that is worn over the penis during sex. Condoms keep sperm from going inside a woman's body. They can be used with a sperm-killing substance (spermicide) to increase their effectiveness. They should be thrown away after one use. Female condom A female condom is a soft, loose-fitting sheath that is put into the vagina before sex. The condom keeps sperm from going inside a woman's body. They should be thrown away after one use. Diaphragm A diaphragm is a soft, dome-shaped barrier. It is inserted into the vagina before sex, along with a spermicide. The diaphragm blocks sperm from entering the uterus, and the spermicide kills sperm. A diaphragm should be left in the vagina for 6-8 hours after sex and removed within 24 hours. A diaphragm is prescribed and fitted by a health care provider. A diaphragm should be replaced every 1-2 years, after giving birth, after gaining more than 15 lb (6.8 kg), and after pelvic surgery. Cervical cap A cervical cap is a round, soft latex or plastic cup that fits over the cervix. It is inserted into the vagina before sex, along with spermicide. It blocks sperm from entering the uterus. The cap should be left in place for 6-8 hours after sex and removed within 48 hours. A cervical cap must be prescribed and fitted by a health care provider. It should be replaced every 2 years. Sponge A sponge is a soft, circular piece of polyurethane foam with spermicide in it. The sponge helps block sperm from entering the uterus, and the spermicide kills sperm. To use it, you make it wet and then insert it into the vagina. It should be inserted before sex, left in for at least 6 hours after sex, and removed and thrown away  within 30 hours. Spermicides Spermicides are chemicals that kill or block sperm from entering the cervix and uterus. They can come as a cream, jelly, suppository, foam, or tablet. A spermicide should be inserted into the vagina with an applicator at least 10-15 minutes before sex to allow time for it to work. The process must be repeated every time you have sex. Spermicides do not require a prescription. Intrauterine contraception Intrauterine device (IUD) An IUD is a T-shaped device that is put in a woman's uterus. There are two types: Hormone IUD.This type contains progestin, a synthetic form  of the hormone progesterone. This type can stay in place for 3-5 years. Copper IUD.This type is wrapped in copper wire. It can stay in place for 10 years. Permanent methods of contraception Female tubal ligation In this method, a woman's fallopian tubes are sealed, tied, or blocked during surgery to prevent eggs from traveling to the uterus. Hysteroscopic sterilization In this method, a small, flexible insert is placed into each fallopian tube. The inserts cause scar tissue to form in the fallopian tubes and block them, so sperm cannot reach an egg. The procedure takes about 3 months to be effective. Another form of birth control must be used during those 3 months. Female sterilization This is a procedure to tie off the tubes that carry sperm (vasectomy). After the procedure, the man can still ejaculate fluid (semen). Another form of birth control must be used for 3 months after the procedure. Natural planning methods Natural family planning In this method, a couple does not have sex on days when the woman could become pregnant. Calendar method In this method, the woman keeps track of the length of each menstrual cycle, identifies the days when pregnancy can happen, and does not have sex on those days. Ovulation method In this method, a couple avoids sex during ovulation. Symptothermal method This method  involves not having sex during ovulation. The woman typically checks for ovulation by watching changes in her temperature and in the consistency of cervical mucus. Post-ovulation method In this method, a couple waits to have sex until after ovulation. Where to find more information Centers for Disease Control and Prevention: [employer] Summary Contraception, also called birth control, refers to methods or devices that prevent pregnancy. Hormonal methods of contraception include implants, injections, pills, patches, vaginal rings, and emergency contraceptives. Barrier methods of contraception can include female condoms, female condoms, diaphragms, cervical caps, sponges, and spermicides. There are two types of IUDs (intrauterine devices). An IUD can be put in a woman's uterus to prevent pregnancy for 3-5 years. Permanent sterilization can be done through a procedure for males and females. Natural family planning methods involve nothaving sex on days when the woman could become pregnant. This information is not intended to replace advice given to you by your health care provider. Make sure you discuss any questions you have with your health care provider. Document Revised: 12/19/2019 Document Reviewed: 12/19/2019 Elsevier Patient Education  2023 [employer].

## 2022-03-17 NOTE — Progress Notes (Signed)
Opened in error

## 2022-03-17 NOTE — Progress Notes (Signed)
41 y.o. [id number] Married [place] American female here referral from PCP for f/u of abnormal vaginal bleeding. PUS completed 11/23/21.  Dealing heavy menses.  Bleed for 2 months in January or February. 2 periods in April and May (21 days apart and then once monthly every 28 days. First 2 days are heavy.  Soaks 3 tampons in an hour.    Periods last 7 days.  Pain is terrible the first 2 days.  Takes Aleve 2 tabs at a time.  It may relieve pain for 2 hours.  Pain keeps her up at night but does not prevent her form doing her work.   She is currently on iron pills.  Hgb not known.   Had a Mirena x 2.  Last time was in 2021.  She had her second Mirena had a lot of bleeding.  She had it removed in April or May of 2021, and her bleeding improved until the beginning of this year.   Had a tubal ligation, and she declines future child bearing.  She is considering hysterectomy.   Used birth control pills and patch.  She does not remember if she had any problems.   Denies smoking, hx HTN, liver or breast disease, migraine with aura, or personal or family history of thromboembolic events.    Narrative & Impression  CLINICAL DATA:  Abnormal uterine bleeding.   EXAM: TRANSABDOMINAL AND TRANSVAGINAL ULTRASOUND OF PELVIS   TECHNIQUE: Both transabdominal and transvaginal ultrasound examinations of the pelvis were performed. Transabdominal technique was performed for global imaging of the pelvis including uterus, ovaries, adnexal regions, and pelvic cul-de-sac. It was necessary to proceed with endovaginal exam following the transabdominal exam to visualize the uterus, endometrium, bilateral ovaries and bilateral adnexa.   COMPARISON:  None   FINDINGS: Uterus   Measurements: 9.8 cm x 4.3 cm x 5.1 cm = volume: 112.1 mL. No fibroids or other mass visualized. Tiny hypoechoic areas are seen within the cervical region.   Endometrium   Thickness: 5.7 mm.  No focal abnormality visualized.    Right ovary   Measurements: 2.9 cm x 2.2 cm x 2.5 cm = volume: 8.4 mL. Normal appearance/no adnexal mass.   Left ovary   Measurements: 5.3 cm x 3.8 cm x 4.5 cm = volume: 46.9 mL. A 4.4 cm x 3.2 cm x 4.0 cm predominantly anechoic structure is seen within the left ovary. No abnormal flow is seen within this region on color Doppler evaluation.   Other findings   There is a trace amount of pelvic free fluid.   IMPRESSION: Large left ovarian cyst. No follow-up imaging is recommended. Reference: Radiology 2019 Nov;293(2):359-371     Electronically Signed   By: Aram Candela M.D.   On: 11/25/2021 01:55    PCP:  Dr. Okey Dupre   Patient's last menstrual period was 03/10/2022 (exact date).     Period Duration (Days): 7 Period Pattern: Regular Menstrual Flow: Heavy Menstrual Control: Tampon Menstrual Control Change Freq (Hours): 20 min Dysmenorrhea: (!) Severe Dysmenorrhea Symptoms: Cramping     Sexually active: Yes.    The current method of family planning is tubal ligation.    Exercising: Yes.     Works with children.  Smoker:  no  Health Maintenance: Pap:  05/19/18, neg; 11/17/2019 ASCUS, neg HPV History of abnormal Pap:  no MMG:  Never  Colonoscopy:  Never BMD:   N/A  Result  N/A TDaP:  07/29/2011 Gardasil:   no Screening Labs:  PCP   reports that  she has never smoked. She has never used smokeless tobacco. She reports that she does not drink alcohol and does not use drugs.  Past Medical History:  Diagnosis Date   Frequent headaches    Mitral valve prolapse     Past Surgical History:  Procedure Laterality Date   CESAREAN SECTION     TUBAL LIGATION      Current Outpatient Medications  Medication Sig Dispense Refill   Cholecalciferol (VITAMIN D3) 1.25 MG (50000 UT) CAPS Take 1 capsule by mouth once a week.     [facility] IRON 325 (65 Fe) MG tablet Take 325 mg by mouth daily.     ibuprofen (ADVIL) 600 MG tablet Take 1 tablet (600 mg total) by mouth every 6 (six)  hours as needed. 30 tablet 0   meloxicam (MOBIC) 15 MG tablet Take 1 tablet (15 mg total) by mouth daily. 30 tablet 1   metFORMIN (GLUCOPHAGE-XR) 500 MG 24 hr tablet SMARTSIG:1 Tablet(s) By Mouth Every Evening     OZEMPIC, 0.25 OR 0.5 MG/DOSE, 2 MG/3ML SOPN Inject 0.5 mg into the skin once a week.     No current facility-administered medications for this visit.    Family History  Problem Relation Age of Onset   Cancer Father    Diabetes Maternal Grandmother    Diabetes Maternal Grandfather    Diabetes Maternal Aunt    Diabetes Maternal Uncle    Diabetes Cousin    Diabetes Other    Hypertension Other     Review of Systems  All other systems reviewed and are negative.   Exam:   BP 118/78 (BP Location: Right Arm, Patient Position: Sitting)   Pulse 89   Resp 18   Ht 5' 3.98" (1.625 m)   Wt 202 lb 6.4 oz (91.8 kg)   LMP 03/10/2022 (Exact Date)   BMI 34.77 kg/m     General appearance: alert, cooperative and appears stated age Head: normocephalic, without obvious abnormality, atraumatic Neck: no adenopathy, supple, symmetrical, trachea midline and thyroid normal to inspection and palpation Lungs: clear to auscultation bilaterally Breasts: normal appearance, no masses or tenderness, No nipple retraction or dimpling, No nipple discharge or bleeding, No axillary adenopathy Heart: regular rate and rhythm Abdomen: soft, non-tender; no masses, no organomegaly Extremities: extremities normal, atraumatic, no cyanosis or edema Skin: skin color, texture, turgor normal. No rashes or lesions Lymph nodes: cervical, supraclavicular, and axillary nodes normal. Neurologic: grossly normal  Pelvic: External genitalia:  no lesions              No abnormal inguinal nodes palpated.              Urethra:  normal appearing urethra with no masses, tenderness or lesions              Bartholins and Skenes: normal                 Vagina: normal appearing vagina with normal color and discharge, no  lesions              Cervix: no lesions.  Small amount of bleeding.               Pap taken: no Bimanual Exam:  Uterus:  normal size, contour, position, consistency, mobility, non-tender              Adnexa: no mass, fullness, tenderness              Rectal exam: yes.  Confirms.  Anus:  normal sphincter tone, no lesions  Chaperone was present for exam: Noreene Larsson, RN  Assessment:    Menorrhagia with irregular menses.  No fibroids on pelvic US.  Hx simple left ovarian cyst under 5 cm.  No [place] follow up needed.  Status post BTL. Hx ASCUS pap and neg HR HPV.   Plan:   She will need a mammogram.  Pap and HR HPV 2024. Return for EMB.  Rationale and procedure explained to rule out infection, polyps, precancer and cancer of the uterus.  Options for treatment discussed:  Lysteda, hormonal contraceptives, endometrial ablation, and laparoscopic hysterectomy.  She would like to avoid hysterectomy at this time.  CBC with iron and ferritin.    After visit summary provided.   47 min  total time was spent for this patient encounter, including preparation, face-to-face counseling with the patient, coordination of care, and documentation of the encounter.

## 2022-03-18 LAB — CBC
HCT: 39.1 % (ref 35.0–45.0)
Hemoglobin: 12.8 g/dL (ref 11.7–15.5)
MCH: 28.4 pg (ref 27.0–33.0)
MCHC: 32.7 g/dL (ref 32.0–36.0)
MCV: 86.9 fL (ref 80.0–100.0)
MPV: 9.7 fL (ref 7.5–12.5)
Platelets: 392 10*3/uL (ref 140–400)
RBC: 4.5 10*6/uL (ref 3.80–5.10)
RDW: 13.2 % (ref 11.0–15.0)
WBC: 5.2 10*3/uL (ref 3.8–10.8)

## 2022-03-18 LAB — IRON: Iron: 67 ug/dL (ref 40–190)

## 2022-03-18 LAB — FERRITIN: Ferritin: 33 ng/mL (ref 16–232)

## 2022-03-19 NOTE — Progress Notes (Signed)
GYNECOLOGY  VISIT   HPI: 41 y.o.   Married  [place]  female   [phone] with Patient's last menstrual period was 03/10/2022 (exact date).   here for endometrial biopsy.   Patient has heavy, irregular and painful periods.   Her CBC, iron and ferritin were normal 03/17/22.  Pelvic US showed normal uterus, EMB 5.7 mm, simple left ovarian cyst 4.4 x 3.2 x 4.0 cm, right ovary normal.   She has used Mirena in the past.    She is declining future childbearing.   Considering hysterectomy and looking to December potential surgery..   Works in Audiological scientist.   States her aunt raised her.   UPT negative today.   GYNECOLOGIC HISTORY: Patient's last menstrual period was 03/10/2022 (exact date). Contraception:  Tubal Menopausal hormone therapy:  none Last mammogram:  Never Last pap smear:  11-17-19 ASCUS:Neg HR HPV, 05-19-18 Neg:Neg HR HPV, 11-17-16 Neg:Pos HR HPV        OB History     Gravida  2   Para  2   Term  2   Preterm      AB      Living  2      SAB      IAB      Ectopic      Multiple      Live Births  2              Patient Active Problem List   Diagnosis Date Noted   Routine general medical examination at a health care facility 11/24/2019   IUD check up 11/02/2015   Prediabetes 11/02/2015   Mitral valve prolapse 12/18/2011    Past Medical History:  Diagnosis Date   Frequent headaches    Mitral valve prolapse     Past Surgical History:  Procedure Laterality Date   CESAREAN SECTION     TUBAL LIGATION      Current Outpatient Medications  Medication Sig Dispense Refill   Cholecalciferol (VITAMIN D3) 1.25 MG (50000 UT) CAPS Take 1 capsule by mouth once a week.     [facility]S IRON 325 (65 Fe) MG tablet Take 325 mg by mouth daily.     ibuprofen (ADVIL) 600 MG tablet Take 1 tablet (600 mg total) by mouth every 6 (six) hours as needed. 30 tablet 0   meloxicam (MOBIC) 15 MG tablet Take 1 tablet (15 mg total) by mouth daily. 30 tablet 1   metFORMIN  (GLUCOPHAGE-XR) 500 MG 24 hr tablet SMARTSIG:1 Tablet(s) By Mouth Every Evening     OZEMPIC, 0.25 OR 0.5 MG/DOSE, 2 MG/3ML SOPN Inject 0.5 mg into the skin once a week.     No current facility-administered medications for this visit.     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  Tobacco Use   Smoking status: Never  Smokeless tobacco: Never  Vaping Use   Vaping Use: Never used  Substance and Sexual Activity   Alcohol use: No   Drug use: No   Sexual activity: Yes    Partners: Male    Birth control/protection: None    Comment: BTL  Other Topics Concern   Not on file  Social History Narrative   Not on file   Social Determinants of Health   Financial Resource Strain: Not on file  Food Insecurity: Not on file  Transportation Needs: Not on file  Physical Activity: Not on file  Stress: Not on file  Social Connections: Not on file  Intimate Partner Violence: Not on file    Review of Systems  All other systems reviewed and are negative.   PHYSICAL EXAMINATION:    BP 114/64   Pulse 89   Ht 5\' 2"  (1.575 m)   Wt 202 lb (91.6 kg)   LMP 03/10/2022 (Exact Date)   SpO2 99%   BMI 36.95 kg/m     General appearance: alert, cooperative and appears stated age   Cervix:  pap and HR HPV collected.   EMB Consent done.  Hibiclens prep.  Local 1% lidocaine, lot 03/12/2022, exp 07/29/23.  Pipelle to 8.5 cm x 2.   Tissue to pathology.  No complications.  Minimal EBL.  Chaperone was present for exam:  09/26/23, CMA  ASSESSMENT  Menorrhagia with irregular menses.  No fibroids on pelvic Marchelle Folks.  Hx simple left ovarian cyst under 5 cm.  No [place] follow up needed.  Dysmenorrhea. Status post BTL. Hx ASCUS pap and neg HR HPV.   PLAN  Pap and HR HPV  FU EMB. I discussed total laparoscopic hysterectomy with bilateral salpingectomy and possible bilateral oophorectomy, cystoscopy.   I reviewed risks, benefits, and alternatives.  Risks include but are not limited to bleeding, infection, damage to surrounding organs, DVT, PE, death, need for reoperation, vaginal cuff dehiscence,  need to convert to a traditional laparotomy incision to complete the procedure. Written information on laparoscopic hysterectomy to patient.   An After Visit Summary was printed and given to the patient.  30 min total time was spent for this patient encounter, including preparation, face-to-face counseling with the patient, coordination of care, and documentation of the encounter in addition to doing endometrial biopsy.

## 2022-03-24 ENCOUNTER — Encounter: Payer: Self-pay | Admitting: Obstetrics and Gynecology

## 2022-03-24 ENCOUNTER — Ambulatory Visit (INDEPENDENT_AMBULATORY_CARE_PROVIDER_SITE_OTHER): Payer: Commercial Managed Care - HMO | Admitting: Obstetrics and Gynecology

## 2022-03-24 ENCOUNTER — Other Ambulatory Visit (HOSPITAL_COMMUNITY)
Admission: RE | Admit: 2022-03-24 | Discharge: 2022-03-24 | Disposition: A | Discharge status: Home / Self Care | Payer: Commercial Managed Care - HMO | Source: Ambulatory Visit | Attending: Obstetrics and Gynecology | Admitting: Obstetrics and Gynecology

## 2022-03-24 VITALS — BP 114/64 | HR 89 | Ht 62.0 in | Wt 202.0 lb

## 2022-03-24 DIAGNOSIS — N921 Excessive and frequent menstruation with irregular cycle: Secondary | ICD-10-CM | POA: Diagnosis not present

## 2022-03-24 DIAGNOSIS — Z124 Encounter for screening for malignant neoplasm of cervix: Secondary | ICD-10-CM

## 2022-03-24 DIAGNOSIS — Z8742 Personal history of other diseases of the female genital tract: Secondary | ICD-10-CM | POA: Diagnosis not present

## 2022-03-24 DIAGNOSIS — Z01419 Encounter for gynecological examination (general) (routine) without abnormal findings: Secondary | ICD-10-CM | POA: Insufficient documentation

## 2022-03-24 LAB — PREGNANCY, URINE: Preg Test, Ur: NEGATIVE

## 2022-03-24 NOTE — Patient Instructions (Signed)
Total Laparoscopic Hysterectomy A total laparoscopic hysterectomy is a minimally invasive surgery to remove the uterus and cervix. The fallopian tubes and ovaries can also be removed during this surgery, if necessary. This procedure may be done to treat problems such as: Growths in the uterus (uterine fibroids) that are not cancer but cause symptoms. A condition that causes the lining of the uterus to grow in other areas (endometriosis). Problems with pelvic support. Cancer of the cervix, ovaries, uterus, or tissue that lines the uterus (endometrium). Excessive bleeding in the uterus. After this procedure, you will no longer be able to have a baby, and you will no longer have a menstrual period. Tell a health care provider about: Any allergies you have. All medicines you are taking, including vitamins, herbs, eye drops, creams, and over-the-counter medicines. Any problems you or family members have had with anesthetic medicines. Any blood disorders you have. Any surgeries you have had. Any medical conditions you have. Whether you are pregnant or may be pregnant. What are the risks? Generally, this is a safe procedure. However, problems may occur, including: Infection. Bleeding. Blood clots in the legs or lungs. Allergic reactions to medicines. Damage to nearby structures or organs. Having to change from this surgery to one in which a large incision is made in the abdomen (abdominal hysterectomy). What happens before the procedure? Staying hydrated Follow instructions from your health care provider about hydration, which may include: Up to 2 hours before the procedure - you may continue to drink clear liquids, such as water, clear fruit juice, black coffee, and plain tea.  Eating and drinking restrictions Follow instructions from your health care provider about eating and drinking, which may include: 8 hours before the procedure - stop eating heavy meals or foods, such as meat, fried  foods, or fatty foods. 6 hours before the procedure - stop eating light meals or foods, such as toast or cereal. 6 hours before the procedure - stop drinking milk or drinks that contain milk. 2 hours before the procedure - stop drinking clear liquids. Medicines Take over-the-counter and prescription medicines only as told by your health care provider. You may be asked to take medicine that helps you have a bowel movement (laxative) to prevent constipation. General instructions If you were asked to do bowel preparation before the procedure, follow instructions from your health care provider. This procedure can affect the way you feel about yourself. Talk with your health care provider about the physical and emotional changes hysterectomy may cause. Do not use any products that contain nicotine or tobacco for at least 4 weeks before the procedure. These products include cigarettes, chewing tobacco, and vaping devices, such as e-cigarettes. If you need help quitting, ask your health care provider. Plan to have a responsible adult take you home from the hospital or clinic. Plan to have a responsible adult care for you for the time you are told after you leave the hospital or clinic. This is important. Surgery safety Ask your health care provider: How your surgery site will be marked. What steps will be taken to help prevent infection. These may include: Removing hair at the surgery site. Washing skin with a germ-killing soap. Receiving antibiotic medicine. What happens during the procedure? An IV will be inserted into one of your veins. You will be given one or more of the following: A medicine to help you relax (sedative). A medicine to make you fall asleep (general anesthetic). A medicine to numb the area (local anesthetic). A medicine   that is injected into your spine to numb the area below and slightly above the injection site (spinal anesthetic). A medicine that is injected into an area of  your body to numb everything below the injection site (regional anesthetic). A gas will be used to inflate your abdomen. This will allow your surgeon to look inside your abdomen and do the surgery. Three or four small incisions will be made in your abdomen. A small device with a light (laparoscope) will be inserted into one of your incisions. Surgical instruments will be inserted through the other incisions in order to perform the procedure. Your uterus and cervix may be removed through your vagina or cut into small pieces and removed through the small incisions. Any other organs that need to be removed will also be removed this way. The gas will be released from inside your abdomen. Your incisions will be closed with stitches (sutures), skin glue, or adhesive strips. A bandage (dressing) may be placed over your incisions. The procedure may vary among health care providers and hospitals. What happens after the procedure? Your blood pressure, heart rate, breathing rate, and blood oxygen level will be monitored until you leave the hospital or clinic. You will be given medicine for pain as needed. You will be encouraged to walk as soon as possible. You will also use a device to help you breathe or do breathing exercises to keep your lungs clear. You may have to wear compression stockings. These stockings help to prevent blood clots and reduce swelling in your legs. You will need to wear a sanitary pad for vaginal discharge or bleeding. Summary Total laparoscopic hysterectomy is a procedure to remove your uterus, cervix, and sometimes the fallopian tubes and ovaries. This procedure can affect the way you feel about yourself. Talk with your health care provider about the physical and emotional changes hysterectomy may cause. After this procedure, you will no longer be able to have a baby, and you will no longer have a menstrual period. You will be given pain medicine to control discomfort after this  procedure. Plan to have a responsible adult take you home from the hospital or clinic. This information is not intended to replace advice given to you by your health care provider. Make sure you discuss any questions you have with your health care provider. Document Revised: 09/25/2021 Document Reviewed: 03/16/2020 Elsevier Patient Education  2023 Elsevier Inc.  

## 2022-03-25 LAB — SURGICAL PATHOLOGY

## 2022-03-27 ENCOUNTER — Telehealth: Payer: Self-pay | Admitting: *Deleted

## 2022-03-27 NOTE — Telephone Encounter (Signed)
-----   Message from Patton Salles, MD sent at 03/26/2022  6:12 AM EDT ----- Please let patient know that her endometrial biopsy is benign.  No infection, polyp, hyperplasia, or malignancy were seen.   Please let me know if she would like to proceed forward with medical therapy or hysterectomy to treat her bleeding.

## 2022-03-27 NOTE — Telephone Encounter (Signed)
Patient informed, she would like to proceed with hysterectomy .

## 2022-04-01 ENCOUNTER — Telehealth: Payer: Self-pay | Admitting: Obstetrics and Gynecology

## 2022-04-01 DIAGNOSIS — R7303 Prediabetes: Secondary | ICD-10-CM

## 2022-04-01 LAB — CYTOLOGY - PAP
Comment: NEGATIVE
High risk HPV: NEGATIVE

## 2022-04-01 NOTE — Telephone Encounter (Signed)
Please precert and schedule surgery for my patient.   She will have a total laparoscopic hysterectomy with bilateral salpingectomy, possible bilateral oophorectomy and cystoscopy at [facility].   She has menorrhagia and irregular menses and dysmenorrhea.  Time needed is 2.5 hours  Assistant needed.   She will need a preop exam.   She needs to get her mammogram done and update a hemoglobin A1C.   Her chart indicates a history or prediabetes, but I do not see a recent A1C in Epic.  I will place a future order for the A1C.

## 2022-04-02 ENCOUNTER — Other Ambulatory Visit: Payer: Self-pay

## 2022-04-02 DIAGNOSIS — R87619 Unspecified abnormal cytological findings in specimens from cervix uteri: Secondary | ICD-10-CM

## 2022-04-04 ENCOUNTER — Other Ambulatory Visit: Payer: Self-pay | Admitting: Obstetrics and Gynecology

## 2022-04-04 DIAGNOSIS — Z1231 Encounter for screening mammogram for malignant neoplasm of breast: Secondary | ICD-10-CM

## 2022-04-04 NOTE — Telephone Encounter (Signed)
Spoke with patient. Reviewed surgery dates. Patient request to proceed with surgery on 06/30/22, patient declines earlier dates.  Advised patient I will forward to business office for return call. I will return call once surgery date and time confirmed. Patient verbalizes understanding and is agreeable.   MMG not scheduled, patient states she will call to schedule today.   HgBA1c scheduled with [facility] on 04/15/22.   Routing to [facility] for benefits

## 2022-04-08 DIAGNOSIS — R7309 Other abnormal glucose: Secondary | ICD-10-CM

## 2022-04-08 DIAGNOSIS — R7303 Prediabetes: Secondary | ICD-10-CM

## 2022-04-08 HISTORY — DX: Prediabetes: R73.03

## 2022-04-08 HISTORY — DX: Other abnormal glucose: R73.09

## 2022-04-14 NOTE — Progress Notes (Deleted)
  Subjective:     Patient ID: Diane Wilson, female   DOB: Jun 05, 1981, 41 y.o.   MRN: [id number]  HPI Patient here today for colposcopy with pap 03-24-22 atypical endocervical cells:Neg HR HPV.  PAP HISTORY: 03-24-22 atypical endocervical cells:Neg HR HPV. 05-19-18 Neg 11-17-19 ASCUS:Neg HR HPV     Review of Systems LMP: Contraception: Tubal UPT:     Objective:   Physical Exam     Assessment:     ***    Plan:     ***

## 2022-04-14 NOTE — Progress Notes (Deleted)
  Subjective:     Patient ID: Diane Wilson, female   DOB: 04/27/1981, 41 y.o.   MRN: [id number]  HPI Patient here today for colposcopy with pap 03-24-22 atypical endocervical cells:Neg HR HPV.  PAP HISTORY: 03-24-22 atypical endocervical cells:Neg HR HPV. 05-19-18 Neg 11-17-19 ASCUS:Neg HR HPV     Review of Systems LMP: Contraception: Tubal UPT:     Objective:   Physical Exam     Assessment:     ***    Plan:     ***    

## 2022-04-15 ENCOUNTER — Ambulatory Visit: Payer: Commercial Managed Care - HMO | Admitting: Obstetrics and Gynecology

## 2022-04-22 ENCOUNTER — Ambulatory Visit
Admission: RE | Admit: 2022-04-22 | Discharge: 2022-04-22 | Disposition: A | Discharge status: Home / Self Care | Payer: Commercial Managed Care - HMO | Source: Ambulatory Visit | Attending: Obstetrics and Gynecology | Admitting: Obstetrics and Gynecology

## 2022-04-22 DIAGNOSIS — Z1231 Encounter for screening mammogram for malignant neoplasm of breast: Secondary | ICD-10-CM

## 2022-04-22 NOTE — Telephone Encounter (Signed)
Patient cancelled her colposcopy appointment for an abnormal pap showing atypical endocervical cells.   I recommend cancelling her hysterectomy procedure, as I cannot proceed with removal of her uterus if she has potential abnormal cervical cells present which have not been evaluated.  I recommend rescheduling her colposcopy procedure with me.  She also has had elevated glucose readings on chart review, and she needs a hemoglobin A1C to determine if she has diabetes or prediabetes, which can affect her health and healing from surgical care.

## 2022-04-24 ENCOUNTER — Other Ambulatory Visit: Payer: Self-pay | Admitting: Obstetrics and Gynecology

## 2022-04-24 DIAGNOSIS — R928 Other abnormal and inconclusive findings on diagnostic imaging of breast: Secondary | ICD-10-CM

## 2022-04-24 NOTE — Telephone Encounter (Signed)
Left message to call Conda Wannamaker, RN at [facility], [phone], OPT 5.  

## 2022-04-28 NOTE — Telephone Encounter (Signed)
Spoke with patient. Advised per Dr. Quincy Simmonds.   LMP approximately 04/06/22 BTL for contraceptive.  Colpo r/s for 10/10 at 1130.  Message to business office for return call.   MMG scheduled 05/01/22.   Patient reports normal HgBA1c with PCP 03/24/22, patient will have copy of results faxed to Dr. Quincy Simmonds.   Will proceed with cancelling surgery at this time. Patient verbalizes understanding and is agreeable.   Call placed to CS spoke with [facility], surgery cancelled.   Routing to [place] for benefits.   Cc: Dr. Antony Blackbird

## 2022-04-28 NOTE — Telephone Encounter (Signed)
After her mammogram and colposcopy are completed, we can consider her options for care.

## 2022-04-29 NOTE — Telephone Encounter (Signed)
Encounter reveiwed and closed.

## 2022-05-01 ENCOUNTER — Ambulatory Visit: Admission: RE | Admit: 2022-05-01 | Payer: 59 | Source: Ambulatory Visit

## 2022-05-01 ENCOUNTER — Ambulatory Visit
Admission: RE | Admit: 2022-05-01 | Discharge: 2022-05-01 | Disposition: A | Discharge status: Home / Self Care | Payer: Commercial Managed Care - HMO | Source: Ambulatory Visit | Attending: Obstetrics and Gynecology | Admitting: Obstetrics and Gynecology

## 2022-05-01 DIAGNOSIS — R928 Other abnormal and inconclusive findings on diagnostic imaging of breast: Secondary | ICD-10-CM

## 2022-05-01 NOTE — Progress Notes (Deleted)
  Subjective:     Patient ID: Diane Wilson, female   DOB: Aug 26, 1980, 41 y.o.   MRN: [id number]  HPI Pap history:  03-24-22 ASCUS, HR HPV negative, 11-17-19 ASCUS:Neg HR HPV, 05-19-18 Neg:Neg HR HPV, 11-17-16 Neg:Pos HR HPV   Review of Systems LMP:  Contraception: BTL  UPT:      Objective:   Physical Exam     Assessment:     ***    Plan:     ***

## 2022-05-03 ENCOUNTER — Ambulatory Visit (HOSPITAL_COMMUNITY)
Admission: EM | Admit: 2022-05-03 | Discharge: 2022-05-03 | Disposition: A | Discharge status: Home / Self Care | Payer: Commercial Managed Care - HMO

## 2022-05-03 ENCOUNTER — Encounter (HOSPITAL_COMMUNITY): Payer: Self-pay | Admitting: Emergency Medicine

## 2022-05-03 DIAGNOSIS — R0789 Other chest pain: Secondary | ICD-10-CM

## 2022-05-03 MED ORDER — BACLOFEN 10 MG PO TABS: 10.0000 mg | ORAL_TABLET | Freq: Three times a day (TID) | ORAL | 0 refills | Status: DC

## 2022-05-03 NOTE — ED Provider Notes (Signed)
[facility]    CSN: [id number] Arrival date & time: 05/03/22  1003      History   Chief Complaint Chief Complaint  Patient presents with   Chest Pain    HPI Diane Wilson is a 41 y.o. female.   Pleasant 41 year old female with a known history of hypertension and diabetes presents today due to left-sided chest wall pain.  She states it started Thursday morning when she got to work.  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  States it feels like a pulled muscle or a twinge.  She states the pain will come, last about 5 minutes, and then resolve spontaneously without any treatment.  She denies any associated or aggravating factors.  She has not done any treatments.  Does have a history of GERD, but denies any active symptoms.  Sitting in our office this morning, she is not having any of her current symptoms.  She states the pain is only located in 1 single spot towards the medial left chest near the distal sternum.  She does not smoke.  No shortness of breath or palpitations.  No history of blood clots, no edema.  No recent changes to medications.  States her blood pressure is well controlled, and her A1c has recently gone down. Denies any rash. Pt did recently complete mammogram on 05/01/22.   Chest Pain   Past Medical History:  Diagnosis Date   Frequent headaches    Mitral valve prolapse     Patient Active Problem List   Diagnosis Date Noted   Routine general medical examination at a health care facility 11/24/2019   IUD check up 11/02/2015   Prediabetes 11/02/2015   Mitral valve prolapse 12/18/2011    Past Surgical History:  Procedure Laterality Date   CESAREAN SECTION     TUBAL LIGATION      OB History     Gravida  2   Para  2   Term  2   Preterm      AB      Living  2      SAB      IAB      Ectopic      Multiple      Live Births  2            Home Medications    Prior to Admission medications   Medication Sig  Start Date End Date Taking? Authorizing Provider  baclofen (LIORESAL) 10 MG tablet Take 1 tablet (10 mg total) by mouth 3 (three) times daily. 05/03/22  Yes Brennen Gardiner L, PA  losartan (COZAAR) 25 MG tablet Take 25 mg by mouth daily.   Yes [provider]  metFORMIN (GLUCOPHAGE-XR) 500 MG 24 hr tablet SMARTSIG:1 Tablet(s) By Mouth Every Evening 01/27/22  Yes [provider]  TRULICITY 0.75 MG/0.5ML SOPN Inject 0.75 mg into the skin once a week. 03/26/22  Yes [provider]  Cholecalciferol (VITAMIN D3) 1.25 MG (50000 UT) CAPS Take 1 capsule by mouth once a week. 01/20/22   [provider]  [facility] IRON 325 (65 Fe) MG tablet Take 325 mg by mouth daily. 02/18/22   [provider]    Family History Family History  Problem Relation Age of Onset   Cancer Father    Breast cancer Sister 56   Diabetes Maternal Aunt    Diabetes Maternal Uncle    Diabetes Maternal Grandmother    Diabetes Maternal Grandfather  Diabetes Cousin    Diabetes Other    Hypertension Other     Social History Social History   Tobacco Use   Smoking status: Never   Smokeless tobacco: Never  Vaping Use   Vaping Use: Never used  Substance Use Topics   Alcohol use: No   Drug use: No     Allergies   Codeine, Diclofenac, Hydrocodone-acetaminophen, Meloxicam, and Oxycontin [oxycodone hcl]   Review of Systems Review of Systems  Cardiovascular:  Positive for chest pain (chest wall near L sternum).  All other systems reviewed and are negative.    Physical Exam Triage Vital Signs ED Triage Vitals [05/03/22 1019]  Enc Vitals Group     BP 138/85     Pulse Rate 74     Resp 16     Temp 98.6 F (37 C)     Temp Source Oral     SpO2 97 %     Weight      Height      Head Circumference      Peak Flow      Pain Score 0     Pain Loc      Pain Edu?      Excl. in GC?    No data found.  Updated Vital Signs BP 138/85 (BP Location: Left Arm)   Pulse 74   Temp 98.6 F  (37 C) (Oral)   Resp 16   SpO2 97%   Visual Acuity Right Eye Distance:   Left Eye Distance:   Bilateral Distance:    Right Eye Near:   Left Eye Near:    Bilateral Near:     Physical Exam Vitals and nursing note reviewed.  Constitutional:      General: She is not in acute distress.    Appearance: Normal appearance. She is well-developed. She is obese. She is not ill-appearing, toxic-appearing or diaphoretic.  HENT:     Head: Normocephalic and atraumatic.     Right Ear: External ear normal.     Mouth/Throat:     Mouth: Mucous membranes are moist.     Pharynx: Oropharynx is clear.  Eyes:     General: No scleral icterus.       Right eye: No discharge.        Left eye: No discharge.     Extraocular Movements: Extraocular movements intact.     Conjunctiva/sclera: Conjunctivae normal.     Pupils: Pupils are equal, round, and reactive to light.  Cardiovascular:     Rate and Rhythm: Normal rate and regular rhythm.     Pulses: Normal pulses.     Heart sounds: Normal heart sounds. No murmur heard.    No friction rub. No gallop.  Pulmonary:     Effort: Pulmonary effort is normal. No respiratory distress.     Breath sounds: Normal breath sounds. No stridor. No wheezing, rhonchi or rales.  Chest:     Chest wall: No tenderness.  Abdominal:     General: Abdomen is flat. Bowel sounds are normal. There is no distension.     Palpations: Abdomen is soft. There is no mass.     Tenderness: There is no abdominal tenderness. There is no right CVA tenderness, left CVA tenderness or guarding.     Hernia: No hernia is present.  Musculoskeletal:        General: No swelling.     Cervical back: Normal range of motion and neck supple. No rigidity or tenderness.  Right lower leg: No edema.     Left lower leg: No edema.  Lymphadenopathy:     Cervical: No cervical adenopathy.  Skin:    General: Skin is warm and dry.     Capillary Refill: Capillary refill takes less than 2 seconds.      Coloration: Skin is not jaundiced.     Findings: No bruising, erythema or rash.  Neurological:     General: No focal deficit present.     Mental Status: She is alert and oriented to person, place, and time.     Sensory: No sensory deficit.     Motor: No weakness.     Gait: Gait normal.  Psychiatric:        Mood and Affect: Mood normal.      UC Treatments / Results  Labs (all labs ordered are listed, but only abnormal results are displayed) Labs Reviewed - No data to display  EKG   Radiology   Procedures Procedures (including critical care time)  Medications Ordered in UC Medications - No data to display  Initial Impression / Assessment and Plan / UC Course  I have reviewed the triage vital signs and the nursing notes.  Pertinent labs & imaging results that were available during my care of the patient were reviewed by me and considered in my medical decision making (see chart for details).     Chest wall pain -patient with symptoms consistent with chest wall pain.  She did recently complete a mammogram which was normal.  I question if the discomfort is secondary to the mammogram procedure.  Differential includes an esophageal spasm.  Patient's risk factors includes GERD and obesity.  Patient has an intolerance to diclofenac and meloxicam, but reports good tolerance to naproxen.  We will do twice daily naproxen, with 3 times daily baclofen as needed.  Also encouraged moist heat to the affected area.  Return to clinic/ER precautions discussed.   Final Clinical Impressions(s) / UC Diagnoses   Final diagnoses:  Chest wall pain     Discharge Instructions      I suspect your symptoms may be related to an esophageal spasm, possibly muscle spasm. You may take 2 over-the-counter naproxen (Aleve) up to twice daily with food. Please apply a warm moist compress to the affected area of your chest. Please take the baclofen up to 3 times daily to help with the spasms.  Potential  side effect includes sedation. Follow-up with your PCP should symptoms persist, return to clinic if any worsening symptoms such as shortness of breath, fever, palpitations.     ED Prescriptions     Medication Sig Dispense Auth. Provider   baclofen (LIORESAL) 10 MG tablet Take 1 tablet (10 mg total) by mouth 3 (three) times daily. 30 each Linc Renne L, PA      PDMP not reviewed this encounter.   Chaney Malling, [place] 05/03/22 1114

## 2022-05-03 NOTE — Discharge Instructions (Addendum)
I suspect your symptoms may be related to an esophageal spasm, possibly muscle spasm. You may take 2 over-the-counter naproxen (Aleve) up to twice daily with food. Please apply a warm moist compress to the affected area of your chest. Please take the baclofen up to 3 times daily to help with the spasms.  Potential side effect includes sedation. Follow-up with your PCP should symptoms persist, return to clinic if any worsening symptoms such as shortness of breath, fever, palpitations.

## 2022-05-03 NOTE — ED Triage Notes (Signed)
Sharp, "like a pulled muscle" pain underneath left breast, intermittent, starting Thursday. Denies aggravating or alleviating factors, palpitations, shortness of breath, dizziness, fatigue, nauseous. Was eating breakfast when it first came on, and will occur randomly throughout the day. When the pain starts, it happens quickly, then stops completely. Works at [employer], does not recall any heavy lifting that may have exacerbated this.

## 2022-05-04 ENCOUNTER — Encounter: Payer: Self-pay | Admitting: Obstetrics and Gynecology

## 2022-05-06 ENCOUNTER — Ambulatory Visit: Payer: Commercial Managed Care - HMO | Admitting: Obstetrics and Gynecology

## 2022-05-06 DIAGNOSIS — Z0289 Encounter for other administrative examinations: Secondary | ICD-10-CM

## 2022-05-12 ENCOUNTER — Encounter: Payer: Self-pay | Admitting: Obstetrics and Gynecology

## 2022-05-21 ENCOUNTER — Telehealth: Payer: Self-pay | Admitting: *Deleted

## 2022-05-21 NOTE — Telephone Encounter (Signed)
Left message to call Sharee Pimple, RN at [place], [phone], OPT 5.    03/24/22 PAP Atypical endocervical cells 04/15/22 patient cancelled colpo 05/06/22 No showed

## 2022-06-30 ENCOUNTER — Ambulatory Visit (HOSPITAL_BASED_OUTPATIENT_CLINIC_OR_DEPARTMENT_OTHER): Admit: 2022-06-30 | Payer: 59 | Admitting: Obstetrics and Gynecology

## 2022-06-30 ENCOUNTER — Encounter (HOSPITAL_BASED_OUTPATIENT_CLINIC_OR_DEPARTMENT_OTHER): Payer: Self-pay

## 2022-06-30 SURGERY — HYSTERECTOMY, TOTAL, LAPAROSCOPIC, WITH SALPINGECTOMY
Anesthesia: Choice | Laterality: Bilateral

## 2022-07-18 NOTE — Telephone Encounter (Signed)
Call to patient, no answer. Voicemail did not pick up.   MyChart message to patient.

## 2022-07-29 NOTE — Telephone Encounter (Signed)
Please send letter to patient for discharge from this practice.

## 2022-07-29 NOTE — Telephone Encounter (Signed)
No return call from patient.  MyChart message not read.  No future appts scheduled.   Routing to Dr. Quincy Simmonds to review and advise.

## 2022-07-31 NOTE — Telephone Encounter (Addendum)
Reviewed with Dr. Quincy Simmonds.  Will send letter with recommendations, if no response from patient after 30 days will proceed with discharge.   Letter reviewed and signed by Dr. Quincy Simmonds, copy mailed to address on file.   Reminder set.   Encounter closed.

## 2022-08-19 NOTE — Progress Notes (Deleted)
GYNECOLOGY  VISIT   HPI: 42 y.o.   Married  [place] American  female   [phone] with No LMP recorded.   here for   colpo  GYNECOLOGIC HISTORY: No LMP recorded. Contraception:  Tubal Menopausal hormone therapy:  n/a Last mammogram:  05/01/22 Breast Density Category B, BI-RADS CATEGORY 1 Neg Last pap smear:   03/24/22 atypical endocervical cells: HR HPV neg, 11/17/19 ASCUS: HR HPV neg        OB History     Gravida  2   Para  2   Term  2   Preterm      AB      Living  2      SAB      IAB      Ectopic      Multiple      Live Births  2              Patient Active Problem List   Diagnosis Date Noted   Routine general medical examination at a health care facility 11/24/2019   IUD check up 11/02/2015   Prediabetes 11/02/2015   Mitral valve prolapse 12/18/2011    Past Medical History:  Diagnosis Date   Elevated hemoglobin A1c 04/08/2022   level 5.8 wtih PCP   Frequent headaches    Mitral valve prolapse     Past Surgical History:  Procedure Laterality Date   CESAREAN SECTION     TUBAL LIGATION      Current Outpatient Medications  Medication Sig Dispense Refill   baclofen (LIORESAL) 10 MG tablet Take 1 tablet (10 mg total) by mouth 3 (three) times daily. 30 each 0   Cholecalciferol (VITAMIN D3) 1.25 MG (50000 UT) CAPS Take 1 capsule by mouth once a week.     [facility]S IRON 325 (65 Fe) MG tablet Take 325 mg by mouth daily.     losartan (COZAAR) 25 MG tablet Take 25 mg by mouth daily.     metFORMIN (GLUCOPHAGE-XR) 500 MG 24 hr tablet SMARTSIG:1 Tablet(s) By Mouth Every Evening     TRULICITY [id number] [id number] SOPN Inject 0.75 mg into the skin once a week.     No current facility-administered medications for this visit.     ALLERGIES: Codeine, Diclofenac, Hydrocodone-acetaminophen, Meloxicam, and Oxycontin [oxycodone hcl]  Family History  Problem Relation Age of Onset   Cancer Father    Breast cancer Sister 42   Diabetes Maternal Aunt    Diabetes Maternal  Uncle    Diabetes Maternal Grandmother    Diabetes Maternal Grandfather    Diabetes Cousin    Diabetes Other    Hypertension Other     Social History   Socioeconomic History   Marital status: Married    Spouse name: Not on file   Number of children: 2   Years of education: Not on file   Highest education level: Not on file  Occupational History    Employer: [employer]    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  Tobacco Use   Smoking status: Never   Smokeless tobacco: Never  Vaping Use   Vaping Use: Never used  Substance and Sexual Activity   Alcohol use: No   Drug use: No   Sexual activity: Yes    Partners: Male    Birth control/protection: None    Comment: BTL  Other Topics Concern   Not on file  Social History Narrative   Not on file   Social Determinants of Radio broadcast assistant  Strain: Not on file  Food Insecurity: Not on file  Transportation Needs: Not on file  Physical Activity: Not on file  Stress: Not on file  Social Connections: Not on file  Intimate Partner Violence: Not on file    Review of Systems  PHYSICAL EXAMINATION:    There were no vitals taken for this visit.    General appearance: alert, cooperative and appears stated age Head: Normocephalic, without obvious abnormality, atraumatic Neck: no adenopathy, supple, symmetrical, trachea midline and thyroid normal to inspection and palpation Lungs: clear to auscultation bilaterally Breasts: normal appearance, no masses or tenderness, No nipple retraction or dimpling, No nipple discharge or bleeding, No axillary or supraclavicular adenopathy Heart: regular rate and rhythm Abdomen: soft, non-tender, no masses,  no organomegaly Extremities: extremities normal, atraumatic, no cyanosis or edema Skin: Skin color, texture, turgor normal. No rashes or lesions Lymph nodes: Cervical, supraclavicular, and axillary nodes normal. No abnormal inguinal nodes palpated Neurologic: Grossly normal  Pelvic: External  genitalia:  no lesions              Urethra:  normal appearing urethra with no masses, tenderness or lesions              Bartholins and Skenes: normal                 Vagina: normal appearing vagina with normal color and discharge, no lesions              Cervix: no lesions                Bimanual Exam:  Uterus:  normal size, contour, position, consistency, mobility, non-tender              Adnexa: no mass, fullness, tenderness              Rectal exam: {yes no:314532}.  Confirms.              Anus:  normal sphincter tone, no lesions  Chaperone was present for exam:  ***  ASSESSMENT     PLAN     An After Visit Summary was printed and given to the patient.  ______ minutes face to face time of which over 50% was spent in counseling.

## 2022-09-01 ENCOUNTER — Telehealth: Payer: Self-pay | Admitting: *Deleted

## 2022-09-01 NOTE — Telephone Encounter (Signed)
Opened in Error.

## 2022-09-02 ENCOUNTER — Ambulatory Visit: Payer: Commercial Managed Care - PPO | Admitting: Obstetrics and Gynecology

## 2022-09-25 NOTE — Progress Notes (Deleted)
GYNECOLOGY  VISIT   HPI: 42 y.o.   Married  [place] American  female   [phone] with No LMP recorded.   here for   colpo  GYNECOLOGIC HISTORY: No LMP recorded. Contraception:  BTL Menopausal hormone therapy:  n/a Last mammogram:  05/01/22 Breast Density Category B, BI-RADS CATEGORY 1 Neg Last pap smear:   03/24/22 atypical endocervical cells, NOS, 11/17/19 ASCUS: HR HPV neg        OB History     Gravida  2   Para  2   Term  2   Preterm      AB      Living  2      SAB      IAB      Ectopic      Multiple      Live Births  2              Patient Active Problem List   Diagnosis Date Noted   Routine general medical examination at a health care facility 11/24/2019   IUD check up 11/02/2015   Prediabetes 11/02/2015   Mitral valve prolapse 12/18/2011    Past Medical History:  Diagnosis Date   Elevated hemoglobin A1c 04/08/2022   level 5.8 wtih PCP   Frequent headaches    Mitral valve prolapse     Past Surgical History:  Procedure Laterality Date   CESAREAN SECTION     TUBAL LIGATION      Current Outpatient Medications  Medication Sig Dispense Refill   baclofen (LIORESAL) 10 MG tablet Take 1 tablet (10 mg total) by mouth 3 (three) times daily. 30 each 0   Cholecalciferol (VITAMIN D3) 1.25 MG (50000 UT) CAPS Take 1 capsule by mouth once a week.     CVS IRON 325 (65 Fe) MG tablet Take 325 mg by mouth daily.     losartan (COZAAR) 25 MG tablet Take 25 mg by mouth daily.     metFORMIN (GLUCOPHAGE-XR) 500 MG 24 hr tablet SMARTSIG:1 Tablet(s) By Mouth Every Evening     TRULICITY [id number] 0000000 SOPN Inject 0.75 mg into the skin once a week.     No current facility-administered medications for this visit.     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  Tobacco Use   Smoking status: Never   Smokeless tobacco: Never  Vaping Use   Vaping Use: Never used  Substance and Sexual Activity   Alcohol use: No   Drug use: No   Sexual activity: Yes    Partners: Male    Birth control/protection: None    Comment: BTL  Other Topics Concern   Not on file  Social History Narrative   Not on file   Social Determinants of Health   Financial Resource Strain: Not  on file  Food Insecurity: Not on file  Transportation Needs: Not on file  Physical Activity: Not on file  Stress: Not on file  Social Connections: Not on file  Intimate Partner Violence: Not on file    Review of Systems  PHYSICAL EXAMINATION:    There were no vitals taken for this visit.    General appearance: alert, cooperative and appears stated age Head: Normocephalic, without obvious abnormality, atraumatic Neck: no adenopathy, supple, symmetrical, trachea midline and thyroid normal to inspection and palpation Lungs: clear to auscultation bilaterally Breasts: normal appearance, no masses or tenderness, No nipple retraction or dimpling, No nipple discharge or bleeding, No axillary or supraclavicular adenopathy Heart: regular rate and rhythm Abdomen: soft, non-tender, no masses,  no organomegaly Extremities: extremities normal, atraumatic, no cyanosis or edema Skin: Skin color, texture, turgor normal. No rashes or lesions Lymph nodes: Cervical, supraclavicular, and axillary nodes normal. No abnormal inguinal nodes palpated Neurologic: Grossly normal  Pelvic: External genitalia:   no lesions              Urethra:  normal appearing urethra with no masses, tenderness or lesions              Bartholins and Skenes: normal                 Vagina: normal appearing vagina with normal color and discharge, no lesions              Cervix: no lesions                Bimanual Exam:  Uterus:  normal size, contour, position, consistency, mobility, non-tender              Adnexa: no mass, fullness, tenderness              Rectal exam: {yes no:314532}.  Confirms.              Anus:  normal sphincter tone, no lesions  Chaperone was present for exam:  ***  ASSESSMENT     PLAN     An After Visit Summary was printed and given to the patient.  ______ minutes face to face time of which over 50% was spent in counseling.

## 2022-10-01 ENCOUNTER — Ambulatory Visit (HOSPITAL_COMMUNITY)
Admission: EM | Admit: 2022-10-01 | Discharge: 2022-10-01 | Disposition: A | Discharge status: Home / Self Care | Payer: Commercial Managed Care - PPO | Attending: Family Medicine | Admitting: Family Medicine

## 2022-10-01 ENCOUNTER — Encounter (HOSPITAL_COMMUNITY): Payer: Self-pay | Admitting: Emergency Medicine

## 2022-10-01 DIAGNOSIS — R519 Headache, unspecified: Secondary | ICD-10-CM | POA: Insufficient documentation

## 2022-10-01 LAB — CBC WITH DIFFERENTIAL/PLATELET
Abs Immature Granulocytes: 0.02 10*3/uL (ref 0.00–0.07)
Basophils Absolute: 0 10*3/uL (ref 0.0–0.1)
Basophils Relative: 1 %
Eosinophils Absolute: 0.1 10*3/uL (ref 0.0–0.5)
Eosinophils Relative: 1 %
HCT: 35.4 % — ABNORMAL LOW (ref 36.0–46.0)
Hemoglobin: 11.8 g/dL — ABNORMAL LOW (ref 12.0–15.0)
Immature Granulocytes: 0 %
Lymphocytes Relative: 45 %
Lymphs Abs: 2.9 10*3/uL (ref 0.7–4.0)
MCH: 28.5 pg (ref 26.0–34.0)
MCHC: 33.3 g/dL (ref 30.0–36.0)
MCV: 85.5 fL (ref 80.0–100.0)
Monocytes Absolute: 0.4 10*3/uL (ref 0.1–1.0)
Monocytes Relative: 6 %
Neutro Abs: 3.1 10*3/uL (ref 1.7–7.7)
Neutrophils Relative %: 47 %
Platelets: 375 10*3/uL (ref 150–400)
RBC: 4.14 MIL/uL (ref 3.87–5.11)
RDW: 13.2 % (ref 11.5–15.5)
WBC: 6.5 10*3/uL (ref 4.0–10.5)
nRBC: 0 % (ref 0.0–0.2)

## 2022-10-01 LAB — C-REACTIVE PROTEIN: CRP: 0.8 mg/dL (ref <1.0)

## 2022-10-01 LAB — CBG MONITORING, ED: Glucose-Capillary: 125 mg/dL — ABNORMAL HIGH (ref 70–99)

## 2022-10-01 LAB — SEDIMENTATION RATE: Sed Rate: 11 mm/hr (ref 0–22)

## 2022-10-01 MED ORDER — PREDNISONE 10 MG (48) PO TBPK: ORAL_TABLET | ORAL | 0 refills | Status: DC

## 2022-10-01 NOTE — Discharge Instructions (Signed)
You have had labs (blood work) sent today. We will call you with any significant abnormalities or if there is need to begin or change treatment or pursue further follow up with your medical provider or in the Emergency Department.  You may also review your test results online through [facility]. If you do not have a MyChart account, instructions to sign up should be on your discharge paperwork.

## 2022-10-01 NOTE — ED Triage Notes (Signed)
Pt c/o right head pains with blurred vision on right eye since yesterday. Took aleve with no relief (at 815am).

## 2022-10-04 NOTE — ED Provider Notes (Signed)
[place]   [id number] 10/01/22 Arrival Time: [place]:  1. Temporal pain    Empirically started on prednisone. Do not highly suspect temporal arteritis but need to cover.  Meds ordered this encounter  Medications   predniSONE (STERAPRED UNI-PAK 48 TAB) 10 MG (48) TBPK tablet    Sig: Take as directed.    Dispense:  48 tablet    Refill:  0   Normal neurological exam. Afebrile without nuchal rigidity. Discussed. Current presentation and symptoms are consistent with prior migraines and are not consistent with SAH, ICH, meningitis. No indication for urgent neuro/diagnostic workup at this time.  Investigations reviewed by me: Results for orders placed or performed during the hospital encounter of 10/01/22  Sedimentation rate  Result Value Ref Range   Sed Rate 11 0 - 22 mm/hr  C-reactive protein  Result Value Ref Range   CRP 0.8 <1.0 mg/dL  CBC with Differential/Platelet  Result Value Ref Range   WBC 6.5 4.0 - 10.5 K/uL   RBC 4.14 3.87 - 5.11 MIL/uL   Hemoglobin 11.8 (L) 12.0 - 15.0 g/dL   HCT 35.4 (L) 36.0 - 46.0 %   MCV 85.5 80.0 - 100.0 fL   MCH 28.5 26.0 - 34.0 pg   MCHC 33.3 30.0 - 36.0 g/dL   RDW 13.2 11.5 - 15.5 %   Platelets 375 150 - 400 K/uL   nRBC 0.0 0.0 - 0.2 %   Neutrophils Relative % 47 %   Neutro Abs 3.1 1.7 - 7.7 K/uL   Lymphocytes Relative 45 %   Lymphs Abs 2.9 0.7 - 4.0 K/uL   Monocytes Relative 6 %   Monocytes Absolute 0.4 0.1 - 1.0 K/uL   Eosinophils Relative 1 %   Eosinophils Absolute 0.1 0.0 - 0.5 K/uL   Basophils Relative 1 %   Basophils Absolute 0.0 0.0 - 0.1 K/uL   Immature Granulocytes 0 %   Abs Immature Granulocytes 0.02 0.00 - 0.07 K/uL  POC CBG monitoring  Result Value Ref Range   Glucose-Capillary 125 (H) 70 - 99 mg/dL   No significant abnormalities other than slightly elevated CBG of which she is aware.  Recommend:  Follow-up Information     Schedule an appointment as soon as possible for a visit  with  Samuel Bouche, Guadalupe Guerra.   Specialty: Nurse Practitioner Why: To be seen as soon as possible, especially if your lab tests are abnormal. We should see the results tomorrow. Contact information: [id number] [place] [phone]                  Reviewed expectations re: course of current medical issues. Questions answered. Outlined signs and symptoms indicating need for more acute intervention. Patient verbalized understanding. After Visit Summary given.   SUBJECTIVE: History from: Patient Patient is able to give a clear and coherent history.  Diane Wilson is a 42 y.o. female who presents with complaint of a right-sided headache. Onset gradual, yesterday. Able to sleep through the night but headache was present upon waking. Location:  right temporal distribution  without radiation. History of headaches: occasional but not similar. Precipitating factors include: none which have been determined. Associated symptoms: Preceding aura: no. Nausea/vomiting: no. Vision changes: mildly blurred vision yesterday; none today. Increased sensitivity to light and to noises: no. Fever: no. Sinus pressure/congestion: no. Extremity weakness: no. Aleve OTC without help. Current headache has not limited normal daily activities. Not the worst headache of her life. Denies  dizziness, loss of balance, muscle weakness, numbness of extremities, and speech difficulties. No head injury reported. Ambulatory without difficulty. No recent travel.  OBJECTIVE:  Vitals:   10/01/22 1927  BP: 135/86  Pulse: 95  Resp: 17  Temp: 98.8 F (37.1 C)  TempSrc: Oral  SpO2: 98%    General appearance: alert; NAD HENT: normocephalic; atraumatic; no specific swelling or TTP over R temporal region of head Eyes: PERRLA; EOMI; conjunctivae normal Neck: supple with FROM Lungs: clear to auscultation bilaterally; unlabored respirations Heart: regular rate and rhythm Extremities: no edema;  symmetrical with no gross deformities Skin: warm and dry Neurologic: alert; speech is fluent and clear without dysarthria or aphasia; CN 2-12 grossly intact; no facial droop; normal gait; normal symmetric reflexes; normal extremity strength and sensation throughout; bilateral upper and lower extremity sensation is grossly intact with 5/5 symmetric strength; normal grip strength Psychological: alert and cooperative; normal mood and affect  Labs: Results for orders placed or performed during the hospital encounter of 10/01/22  Sedimentation rate  Result Value Ref Range   Sed Rate 11 0 - 22 mm/hr  C-reactive protein  Result Value Ref Range   CRP 0.8 <1.0 mg/dL  CBC with Differential/Platelet  Result Value Ref Range   WBC 6.5 4.0 - 10.5 K/uL   RBC 4.14 3.87 - 5.11 MIL/uL   Hemoglobin 11.8 (L) 12.0 - 15.0 g/dL   HCT 35.4 (L) 36.0 - 46.0 %   MCV 85.5 80.0 - 100.0 fL   MCH 28.5 26.0 - 34.0 pg   MCHC 33.3 30.0 - 36.0 g/dL   RDW 13.2 11.5 - 15.5 %   Platelets 375 150 - 400 K/uL   nRBC 0.0 0.0 - 0.2 %   Neutrophils Relative % 47 %   Neutro Abs 3.1 1.7 - 7.7 K/uL   Lymphocytes Relative 45 %   Lymphs Abs 2.9 0.7 - 4.0 K/uL   Monocytes Relative 6 %   Monocytes Absolute 0.4 0.1 - 1.0 K/uL   Eosinophils Relative 1 %   Eosinophils Absolute 0.1 0.0 - 0.5 K/uL   Basophils Relative 1 %   Basophils Absolute 0.0 0.0 - 0.1 K/uL   Immature Granulocytes 0 %   Abs Immature Granulocytes 0.02 0.00 - 0.07 K/uL  POC CBG monitoring  Result Value Ref Range   Glucose-Capillary 125 (H) 70 - 99 mg/dL   Labs Reviewed  CBC WITH DIFFERENTIAL/PLATELET - Abnormal; Notable for the following components:      Result Value   Hemoglobin 11.8 (*)    HCT 35.4 (*)    All other components within normal limits  CBG MONITORING, ED - Abnormal; Notable for the following components:   Glucose-Capillary 125 (*)    All other components within normal limits  SEDIMENTATION RATE  C-REACTIVE PROTEIN    Imaging: No  results found.  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  Tobacco Use   Smoking status: Never   Smokeless tobacco: Never  Vaping Use   Vaping Use: Never used  Substance and Sexual Activity   Alcohol use: No   Drug use: No   Sexual activity: Yes    Partners: Male    Birth control/protection: None    Comment: BTL  Other Topics Concern   Not on file  Social History Narrative   Not on file   Social Determinants of Health   Financial Resource Strain: Not on file  Food Insecurity: Not on file  Transportation Needs: Not on file  Physical Activity: Not on file  Stress: Not on file  Social Connections: Not on file  Intimate Partner Violence: Not on file   Family History  Problem Relation Age of Onset   Cancer Father    Breast cancer Sister 29   Diabetes Maternal Aunt    Diabetes Maternal Uncle    Diabetes Maternal Grandmother    Diabetes Maternal Grandfather    Diabetes Cousin    Diabetes Other    Hypertension Other    Past Surgical History:  Procedure Laterality Date   CESAREAN SECTION     TUBAL LIGATION        Vanessa Kick, MD 10/04/22 1031

## 2022-10-09 ENCOUNTER — Ambulatory Visit: Payer: Commercial Managed Care - PPO | Admitting: Obstetrics and Gynecology

## 2022-10-10 ENCOUNTER — Ambulatory Visit (HOSPITAL_COMMUNITY)
Admission: EM | Admit: 2022-10-10 | Discharge: 2022-10-10 | Disposition: A | Discharge status: Home / Self Care | Payer: No Typology Code available for payment source

## 2022-10-10 ENCOUNTER — Encounter (HOSPITAL_COMMUNITY): Payer: Self-pay | Admitting: Emergency Medicine

## 2022-10-10 DIAGNOSIS — R1013 Epigastric pain: Secondary | ICD-10-CM | POA: Diagnosis not present

## 2022-10-10 MED ORDER — ALUM & MAG HYDROXIDE-SIMETH 200-200-20 MG/5ML PO SUSP
ORAL | Status: AC
  Filled 2022-10-10: qty 30

## 2022-10-10 MED ORDER — ALUM & MAG HYDROXIDE-SIMETH 200-200-20 MG/5ML PO SUSP
30.0000 mL | Freq: Once | ORAL | Status: AC
  Administered 2022-10-10: 30 mL via ORAL

## 2022-10-10 MED ORDER — LIDOCAINE VISCOUS HCL 2 % MT SOLN
OROMUCOSAL | Status: AC
  Filled 2022-10-10: qty 15

## 2022-10-10 MED ORDER — PANTOPRAZOLE SODIUM 20 MG PO TBEC: 20.0000 mg | DELAYED_RELEASE_TABLET | Freq: Every day | ORAL | 1 refills | Status: DC

## 2022-10-10 MED ORDER — LIDOCAINE VISCOUS HCL 2 % MT SOLN
15.0000 mL | Freq: Once | OROMUCOSAL | Status: AC
  Administered 2022-10-10: 15 mL via OROMUCOSAL

## 2022-10-10 NOTE — ED Triage Notes (Signed)
Wed around 4p didn't feel good and thought medications taking so stopped all medications. Ate ice cream when got home from work. Next morning having upper abd pains. Forced self to go to bathroom and had loose stools. Didn't eat nothing yesterday due to upper abd pains still. Ate pack noodles today which stayed down as well as an apple.

## 2022-10-10 NOTE — Discharge Instructions (Addendum)
Take prescribed medicines as directed. Medicine will help reduce the amount of acid your stomach makes and therefore improve your reflux symptoms related to acid production.   Avoid spicy or acidic foods like tomatoes, chocolate, coffee, or acidic fruits like oranges as these can trigger symptoms.  I have included acid reflux education in your packet for your review. Please also allow 2 hours after meals before lying flat to help prevent symptoms.   If your symptoms do not improve in the next 5-7 days with interventions, please return. Go to the emergency room for severe symptoms of shortness of breath, worsening or uncontrolled abdominal or chest pain, headache, light headedness, feeling faint, nausea, vomiting, bloody vomit or stools, black tarry stools, or any other new/severe symptoms. I hope you feel better!

## 2022-10-10 NOTE — ED Provider Notes (Signed)
[place]    CSN: [id number] Arrival date & time: 10/10/22  1155      History   Chief Complaint No chief complaint on file.   HPI Diane Wilson is a 42 y.o. female.   Patient presents to urgent care for evaluation of significant epigastric discomfort and burning sensation to the upper abdomen that started approximately 2 days ago.  Patient is currently on a restricted calorie diet and has been eating lots of salads with decreased oral intake to promote weight loss.  She states she has been nauseous but has not had any vomiting.  Denies recent excessive use of NSAIDs, chest pain, shortness of breath, heart palpitations, and fever/chills.  No lower abdominal pain reported.  She has also had intermittent episodes of diarrhea over the last couple of days and has found herself to be with excessive flatulence and burping.  No flank pain, urinary symptoms, or low back pain reported.  States she ate some fried fish the day that symptoms started and believes that this may have triggered the burning sensation to her stomach after eating mostly bland food for several days.  She also ate some mint chocolate chip ice cream and believes that this made her symptoms worse.  She has never had gastroesophageal reflux in the past.  Denies history of diabetes or other chronic gastrointestinal problems.     Past Medical History:  Diagnosis Date   Elevated hemoglobin A1c 04/08/2022   level 5.8 wtih PCP   Frequent headaches    Mitral valve prolapse     Patient Active Problem List   Diagnosis Date Noted   Routine general medical examination at a health care facility 11/24/2019   IUD check up 11/02/2015   Prediabetes 11/02/2015   Mitral valve prolapse 12/18/2011    Past Surgical History:  Procedure Laterality Date   CESAREAN SECTION     TUBAL LIGATION      OB History     Gravida  2   Para  2   Term  2   Preterm      AB      Living  2      SAB      IAB      Ectopic       Multiple      Live Births  2            Home Medications    Prior to Admission medications   Medication Sig Start Date End Date Taking? Authorizing Provider  pantoprazole (PROTONIX) 20 MG tablet Take 1 tablet (20 mg total) by mouth daily. 10/10/22  Yes Talbot Grumbling, FNP  baclofen (LIORESAL) 10 MG tablet Take 1 tablet (10 mg total) by mouth 3 (three) times daily. 05/03/22   Crain, Whitney L, PA  Cholecalciferol (VITAMIN D3) 1.25 MG (50000 UT) CAPS Take 1 capsule by mouth once a week. 01/20/22   [provider]  [facility] IRON 325 (65 Fe) MG tablet Take 325 mg by mouth daily. 02/18/22   [provider]  losartan (COZAAR) 25 MG tablet Take 25 mg by mouth daily.    [provider]  metFORMIN (GLUCOPHAGE-XR) 500 MG 24 hr tablet SMARTSIG:1 Tablet(s) By Mouth Every Evening 01/27/22   [provider]  predniSONE (STERAPRED UNI-PAK 48 TAB) 10 MG (48) TBPK tablet Take as directed. 10/01/22   Vanessa Kick, MD  TRULICITY [id number] SOPN Inject 0.75 mg into the skin once a week. 03/26/22   [provider]  valsartan (DIOVAN) 80 MG tablet Take 80 mg by mouth daily.    [provider]    Family History Family History  Problem Relation Age of Onset   Cancer Father    Breast cancer Sister 4   Diabetes Maternal Aunt    Diabetes Maternal Uncle    Diabetes Maternal Grandmother    Diabetes Maternal Grandfather    Diabetes Cousin    Diabetes Other    Hypertension Other     Social History Social History   Tobacco Use   Smoking status: Never   Smokeless tobacco: Never  Vaping Use   Vaping Use: Never used  Substance Use Topics   Alcohol use: No   Drug use: No     Allergies   Codeine, Diclofenac, Hydrocodone-acetaminophen, Meloxicam, and Oxycontin [oxycodone hcl]   Review of Systems Review of Systems Per HPI  Physical Exam Triage Vital Signs ED Triage Vitals  Enc Vitals Group     BP 10/10/22 1331 123/82     Pulse Rate  10/10/22 1331 96     Resp 10/10/22 1331 15     Temp 10/10/22 1331 99.1 F (37.3 C)     Temp Source 10/10/22 1331 Oral     SpO2 10/10/22 1331 98 %     Weight --      Height --      Head Circumference --      Peak Flow --      Pain Score 10/10/22 1330 8     Pain Loc --      Pain Edu? --      Excl. in [place]? --    No data found.  Updated Vital Signs BP 123/82 (BP Location: Right Arm)   Pulse 96   Temp 99.1 F (37.3 C) (Oral)   Resp 15   LMP 09/22/2022   SpO2 98%   Visual Acuity Right Eye Distance:   Left Eye Distance:   Bilateral Distance:    Right Eye Near:   Left Eye Near:    Bilateral Near:     Physical Exam Vitals and nursing note reviewed.  Constitutional:      Appearance: She is not ill-appearing or toxic-appearing.  HENT:     Head: Normocephalic and atraumatic.     Right Ear: Hearing, tympanic membrane, ear canal and external ear normal.     Left Ear: Hearing, tympanic membrane, ear canal and external ear normal.     Nose: Nose normal.     Mouth/Throat:     Lips: Pink.     Mouth: Mucous membranes are moist. No injury.     Tongue: No lesions. Tongue does not deviate from midline.     Palate: No mass and lesions.     Pharynx: Oropharynx is clear. Uvula midline. No pharyngeal swelling, oropharyngeal exudate, posterior oropharyngeal erythema or uvula swelling.     Tonsils: No tonsillar exudate or tonsillar abscesses.  Eyes:     General: Lids are normal. Vision grossly intact. Gaze aligned appropriately.     Extraocular Movements: Extraocular movements intact.     Conjunctiva/sclera: Conjunctivae normal.  Cardiovascular:     Rate and Rhythm: Normal rate and regular rhythm.     Heart sounds: Normal heart sounds, S1 normal and S2 normal.  Pulmonary:     Effort: Pulmonary effort is normal. No respiratory distress.     Breath sounds: Normal breath sounds and air entry.  Abdominal:     General: Abdomen is flat. Bowel sounds  are normal. There is no distension.      Palpations: Abdomen is soft.     Tenderness: There is abdominal tenderness in the epigastric area and left upper quadrant. There is no right CVA tenderness, left CVA tenderness or guarding. Negative signs include Murphy's sign.     Comments: No peritoneal signs to abdominal exam, however she is extremely tender to the epigastric and the left upper quadrant region.  Musculoskeletal:     Cervical back: Neck supple.  Skin:    General: Skin is warm and dry.     Capillary Refill: Capillary refill takes less than 2 seconds.     Findings: No rash.  Neurological:     General: No focal deficit present.     Mental Status: She is alert and oriented to person, place, and time. Mental status is at baseline.     Cranial Nerves: No dysarthria or facial asymmetry.  Psychiatric:        Mood and Affect: Mood normal.        Speech: Speech normal.        Behavior: Behavior normal.        Thought Content: Thought content normal.        Judgment: Judgment normal.      UC Treatments / Results  Labs (all labs ordered are listed, but only abnormal results are displayed) Labs Reviewed - No data to display  EKG   Radiology No results found.  Procedures Procedures (including critical care time)  Medications Ordered in UC Medications  alum & mag hydroxide-simeth (MAALOX/MYLANTA) 200-200-20 MG/5ML suspension 30 mL (30 mLs Oral Given 10/10/22 1409)  lidocaine (XYLOCAINE) 2 % viscous mouth solution 15 mL (15 mLs Mouth/Throat Given 10/10/22 1409)    Initial Impression / Assessment and Plan / UC Course  I have reviewed the triage vital signs and the nursing notes.  Pertinent labs & imaging results that were available during my care of the patient were reviewed by me and considered in my medical decision making (see chart for details).   1.  Dyspepsia, epigastric abdominal pain Presentation is consistent with acute dyspepsia due to recent decreased oral intake and excessive acid production.  No  peritoneal signs to abdominal exam.  She has not used any over-the-counter medications.  GI cocktail given in clinic with some relief.,  And GERD triggers discussed.  Advised to avoid spicy, fatty, fried, and greasy foods.  Education on further GERD triggers placed in after visit summary.  Advise follow-up with primary care provider in the next 1 to 2 weeks if symptoms fail to improve with use of Protonix 20 mg once daily in the morning before meals.  She is agreeable with this plan.  May use Tums over-the-counter as needed.   Discussed physical exam and available lab work findings in clinic with patient.  Counseled patient regarding appropriate use of medications and potential side effects for all medications recommended or prescribed today. Discussed red flag signs and symptoms of worsening condition,when to call the PCP office, return to urgent care, and when to seek higher level of care in the emergency department. Patient verbalizes understanding and agreement with plan. All questions answered. Patient discharged in stable condition.    Final Clinical Impressions(s) / UC Diagnoses   Final diagnoses:  Dyspepsia  Abdominal pain, epigastric     Discharge Instructions      Take prescribed medicines as directed. Medicine will help reduce the amount of acid your stomach makes and therefore improve your reflux  symptoms related to acid production.   Avoid spicy or acidic foods like tomatoes, chocolate, coffee, or acidic fruits like oranges as these can trigger symptoms.  I have included acid reflux education in your packet for your review. Please also allow 2 hours after meals before lying flat to help prevent symptoms.   If your symptoms do not improve in the next 5-7 days with interventions, please return. Go to the emergency room for severe symptoms of shortness of breath, worsening or uncontrolled abdominal or chest pain, headache, light headedness, feeling faint, nausea, vomiting, bloody vomit  or stools, black tarry stools, or any other new/severe symptoms. I hope you feel better!      ED Prescriptions     Medication Sig Dispense Auth. Provider   pantoprazole (PROTONIX) 20 MG tablet Take 1 tablet (20 mg total) by mouth daily. 30 tablet Talbot Grumbling, FNP      PDMP not reviewed this encounter.   Talbot Grumbling, [place] 10/10/22 1434

## 2022-10-13 ENCOUNTER — Telehealth: Payer: Self-pay | Admitting: *Deleted

## 2022-10-13 NOTE — Telephone Encounter (Signed)
03/24/22 PAP Atypical endocervical cells 04/15/22 Pt Cx colpo 05/06/22 No show 09/02/22 Pt Cx 10/09/22 Pt Cx < 24hrs  Letter mailed 07/31/22   Spoke with patient. Advised as seen above. Reviewed any potential barriers to scheduling, states LMP 09/22/22, due to start in 8 days, typically last 7 days. Patient request to reschedule colpo.   Colpo scheduled for 11/04/22 at 0900 with Dr. Quincy Simmonds. Advised patient to avoid being discharged from practice, must keep appt as scheduled. Patient verbalizes understanding and is agreeable.   Routing to provider for final review. Patient is agreeable to disposition. Will close encounter.

## 2022-10-21 NOTE — Progress Notes (Signed)
GYNECOLOGY  VISIT   HPI: 42 y.o.   Married  [place]  female   [phone] with Patient's last menstrual period was 10/15/2022.   here for repeat pap and colposcopy.   Her pap 03/24/22 showed atypical endocervical cells, negative HR HPV.   Cycles are heavy with pad change every 2 hours and bleeding through clothes and stains bed at night.   Has pain for the first two days of her cycle also.  Benign EMB in August, 2023.  Pelvic US done 11/23/21 which showed a normal uterus and left ovariay cyst 4.4 x 3.2 cm. No abnormal blood flow.   Used Mirena in past and had bleeding for a year.   UPT negative.  GYNECOLOGIC HISTORY: Patient's last menstrual period was 10/15/2022. Contraception:  Tubal Menopausal hormone therapy:  n/a Last mammogram:  05/01/22 Breast Density Category B, BI-RADS CAT 1 neg Last pap smear:   03/24/22 atypical endocervical cells, NOS, 11/17/19 ASCUS: HR HPV neg        OB History     Gravida  2   Para  2   Term  2   Preterm      AB      Living  2      SAB      IAB      Ectopic      Multiple      Live Births  2              Patient Active Problem List   Diagnosis Date Noted   Routine general medical examination at a health care facility 11/24/2019   IUD check up 11/02/2015   Prediabetes 11/02/2015   Mitral valve prolapse 12/18/2011    Past Medical History:  Diagnosis Date   Elevated hemoglobin A1c 04/08/2022   level 5.8 wtih PCP   Frequent headaches    HTN (hypertension)    Mitral valve prolapse     Past Surgical History:  Procedure Laterality Date   CESAREAN SECTION     TUBAL LIGATION      Current Outpatient Medications  Medication Sig Dispense Refill   Cholecalciferol (VITAMIN D3) 1.25 MG (50000 UT) CAPS Take 1 capsule by mouth once a week.     [facility]S IRON 325 (65 Fe) MG tablet Take 325 mg by mouth daily.     losartan (COZAAR) 25 MG tablet Take 25 mg by mouth daily.     metFORMIN (GLUCOPHAGE-XR) 500 MG 24 hr tablet  SMARTSIG:1 Tablet(s) By Mouth Every Evening     pantoprazole (PROTONIX) 20 MG tablet Take 1 tablet (20 mg total) by mouth daily. 30 tablet 1   valsartan (DIOVAN) 80 MG tablet Take 80 mg by mouth daily.     TRULICITY 0.75 MG/0.5ML SOPN Inject 0.75 mg into the skin once a week. (Patient not taking: Reported on 11/04/2022)     No current facility-administered medications for this visit.     ALLERGIES: Codeine, Diclofenac, Hydrocodone-acetaminophen, Meloxicam, and Oxycontin [oxycodone hcl]  Family History  Problem Relation Age of Onset   Cancer Father    Breast cancer Sister 73   Diabetes Maternal Aunt    Diabetes Maternal Uncle    Diabetes Maternal Grandmother    Diabetes Maternal Grandfather    Diabetes Cousin    Diabetes Other    Hypertension Other     Social History   Socioeconomic History   Marital status: Married    Spouse name: Not on file   Number of children: 2  Years of education: Not on file   Highest education level: Not on file  Occupational History    Employer: [employer]    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  Tobacco Use   Smoking status: Never   Smokeless tobacco: Never  Vaping Use   Vaping Use: Never used  Substance and Sexual Activity   Alcohol use: No   Drug use: No   Sexual activity: Yes    Partners: Male    Birth control/protection: None    Comment: BTL  Other Topics Concern   Not on file  Social History Narrative   Not on file   Social Determinants of Health   Financial Resource Strain: Not on file  Food Insecurity: Not on file  Transportation Needs: Not on file  Physical Activity: Not on file  Stress: Not on file  Social Connections: Not on file  Intimate Partner Violence: Not on file    Review of Systems  All other systems reviewed and are negative.   PHYSICAL EXAMINATION:    BP 128/84 (BP Location: Right Arm, Patient Position: Sitting, Cuff Size: Large)   Pulse 100   Ht 5\' 2"  (1.575 m)   Wt 219 lb (99.3 kg)   LMP 10/15/2022   SpO2 99%    BMI 40.06 kg/m     General appearance: alert, cooperative and appears stated age   Colposcopy - cervix, vagina. Consent for procedure.  3% acetic acid used in vagina and on cervix. White light and green light filter used.  Colposcopy satisfactory:  Yes   __x___          No    _____ Findings:    Cervix:  pap and HR HPV collected.  Mosaics at 3 and 9:00.  Vagina:  no lesions.     Biopsies:   ECC, biopsy 3:00, biopsy 9:00. Monsel's placed.  Minimal EBL. No complications.   Chaperone was present for exam:  Warren Lacymily F, CMA  ASSESSMENT  Pap with atypical endocervical cells. Menorrhagia with irregular menses.  Dysmenorrhea. FH ovarian cancer in one aunt.  HTN.   PLAN  FU pap and HR HPV testing. FU biopsy results.  We discussed potential LEEP procedure for treatment of abnormal cervical cells.  We discussed tx options for heavy and painful periods:  hysterectomy and Micronor and endometrial ablation.  20 min  total time was spent for this patient encounter, including preparation, face-to-face counseling with the patient, coordination of care, and documentation of the encounter in addition to doing the colposcopy.

## 2022-11-04 ENCOUNTER — Other Ambulatory Visit (HOSPITAL_COMMUNITY)
Admission: RE | Admit: 2022-11-04 | Discharge: 2022-11-04 | Disposition: A | Discharge status: Home / Self Care | Payer: Commercial Managed Care - PPO | Source: Ambulatory Visit | Attending: Obstetrics and Gynecology | Admitting: Obstetrics and Gynecology

## 2022-11-04 ENCOUNTER — Ambulatory Visit: Payer: Commercial Managed Care - PPO | Admitting: Obstetrics and Gynecology

## 2022-11-04 ENCOUNTER — Encounter: Payer: Self-pay | Admitting: Obstetrics and Gynecology

## 2022-11-04 VITALS — BP 128/84 | HR 100 | Ht 62.0 in | Wt 219.0 lb

## 2022-11-04 DIAGNOSIS — N946 Dysmenorrhea, unspecified: Secondary | ICD-10-CM | POA: Diagnosis not present

## 2022-11-04 DIAGNOSIS — R87619 Unspecified abnormal cytological findings in specimens from cervix uteri: Secondary | ICD-10-CM

## 2022-11-04 DIAGNOSIS — N921 Excessive and frequent menstruation with irregular cycle: Secondary | ICD-10-CM | POA: Diagnosis not present

## 2022-11-04 DIAGNOSIS — Z01812 Encounter for preprocedural laboratory examination: Secondary | ICD-10-CM | POA: Diagnosis not present

## 2022-11-04 LAB — PREGNANCY, URINE: Preg Test, Ur: NEGATIVE

## 2022-11-04 NOTE — Patient Instructions (Signed)
Norethindrone Tablets (Contraception) What is this medication? NORETHINDRONE (nor eth IN drone) prevents ovulation and pregnancy. It belongs to a group of medications called contraceptives. This medication is a progestin hormone. This medicine may be used for other purposes; ask your health care provider or pharmacist if you have questions. COMMON BRAND NAME(S): Camila, Deblitane 28-Day, [place]in, [place], [place], [place], [place], Nor-QD, Nora-BE, Norlyroc, Ortho Micronor, [employer] 28-Day What should I tell my care team before I take this medication? They need to know if you have any of these conditions: Blood vessel disease or blood clots Breast, cervical, or vaginal cancer Diabetes Heart disease Kidney disease Liver disease Mental depression Migraine Seizures Stroke Vaginal bleeding An unusual or allergic reaction to norethindrone, other medications, foods, dyes, or preservatives Pregnant or trying to get pregnant Breast-feeding How should I use this medication? Take this medication by mouth with a glass of water. You may take it with or without food. Follow the directions on the prescription label. Take this medication at the same time each day and in the order directed on the package. Do not take your medication more often than directed. Contact your care team about the use of this medication in children. Special care may be needed. This medication has been used in female children who have started having menstrual periods. A patient package insert for the product will be given with each prescription and refill. Read this sheet carefully each time. The sheet may change frequently. Overdosage: If you think you have taken too much of this medicine contact a poison control center or emergency room at once. NOTE: This medicine is only for you. Do not share this medicine with others. What if I miss a dose? Try not to miss a dose. Every time you miss a dose or take a dose late your chance of  pregnancy increases. When 1 pill is missed (even if only 3 hours late), take the missed pill as soon as possible and continue taking a pill each day at the regular time (use a back-up method of birth control for the next 48 hours). If more than 1 dose is missed, use an additional birth control method for the rest of your pill pack until menses occurs. Contact your care team if more than 1 dose has been missed. What may interact with this medication? Do not take this medication with any of the following: Amprenavir or fosamprenavir Bosentan This medication may also interact with the following: Antibiotics or medications for infections, especially rifampin, rifabutin, rifapentine, and griseofulvin, and possibly penicillins or tetracyclines Aprepitant Barbiturate medications, such as phenobarbital Carbamazepine Felbamate Modafinil Oxcarbazepine Phenytoin Ritonavir or other medications for HIV infection or AIDS St. John's wort Topiramate This list may not describe all possible interactions. Give your health care provider a list of all the medicines, herbs, non-prescription drugs, or dietary supplements you use. Also tell them if you smoke, drink alcohol, or use illegal drugs. Some items may interact with your medicine. What should I watch for while using this medication? Visit your care team for regular checks on your progress. You will need a regular breast and pelvic exam and Pap smear while on this medication. Use an additional method of birth control during the first cycle that you take these tablets. If you have any reason to think you are pregnant, stop taking this medication right away and contact your care team. If you are taking this medication for hormone related problems, it may take several cycles of use to see improvement in your  condition. This medication does not protect you against HIV infection (AIDS) or any other sexually transmitted diseases. What side effects may I notice from  receiving this medication? Side effects that you should report to your care team as soon as possible: Allergic reactions--skin rash, itching, hives, swelling of the face, lips, tongue, or throat Blood clot--pain, swelling, or warmth in the leg, shortness of breath, chest pain Gallbladder problems--severe stomach pain, nausea, vomiting, fever Increase in blood pressure Liver injury--right upper belly pain, loss of appetite, nausea, light-colored stool, dark yellow or brown urine, yellowing skin or eyes, unusual weakness or fatigue New or worsening migraines or headaches Stroke--sudden numbness or weakness of the face, arm, or leg, trouble speaking, confusion, trouble walking, loss of balance or coordination, dizziness, severe headache, change in vision Unusual vaginal discharge, itching, or odor Worsening mood, feelings of depression Side effects that usually do not require medical attention (report to your care team if they continue or are bothersome): Breast pain or tenderness Dark patches of skin on the face or other sun-exposed areas Irregular menstrual cycles or spotting Nausea Weight gain This list may not describe all possible side effects. Call your doctor for medical advice about side effects. You may report side effects to FDA at 1-800-FDA-1088. Where should I keep my medication? Keep out of the reach of children and pets. Store at room temperature between 15 and 30 degrees C (59 and 86 degrees F). Throw away any unused medication after the expiration date. NOTE: This sheet is a summary. It may not cover all possible information. If you have questions about this medicine, talk to your doctor, pharmacist, or health care provider.  2023 Elsevier/Gold Standard (2020-09-16 00:00:00)   Colposcopy, Care After  The following information offers guidance on how to care for yourself after your procedure. Your health care provider may also give you more specific instructions. If you have  problems or questions, contact your health care provider. What can I expect after the procedure? If you had a colposcopy without a biopsy, you can expect to feel fine right away after your procedure. However, you may have some spotting of blood for a few days. You can return to your normal activities. If you had a colposcopy with a biopsy, it is common after the procedure to have: Soreness and mild pain. These may last for a few days. Mild vaginal bleeding or discharge that is dark-colored and grainy. This may last for a few days. The discharge may be caused by a liquid (solution) that was used during the procedure. You may need to wear a sanitary pad during this time. Spotting of blood for at least 48 hours after the procedure. Follow these instructions at home: Medicines Take over-the-counter and prescription medicines only as told by your health care provider. Talk with your health care provider about what type of over-the-counter pain medicines and prescription medicines you can start to take again. It is especially important to talk with your health care provider if you take blood thinners. Activity Avoid using douche products, using tampons, and having sex for at least 3 days after the procedure or for as long as told by your health care provider. Return to your normal activities as told by your health care provider. Ask your health care provider what activities are safe for you. General instructions Ask your health care provider if you may take baths, swim, or use a hot tub. You may take showers. If you use birth control (contraception), continue to use it.  Keep all follow-up visits. This is important. Contact a health care provider if: You have a fever or chills. You faint or feel light-headed. Get help right away if: You have heavy bleeding from your vagina or pass blood clots. Heavy bleeding is bleeding that soaks through a sanitary pad in less than 1 hour. You have vaginal discharge  that is abnormal, is yellow in color, or smells bad. This could be a sign of infection. You have severe pain or cramps in your lower abdomen that do not go away with medicine. Summary If you had a colposcopy without a biopsy, you can expect to feel fine right away, but you may have some spotting of blood for a few days. You can return to your normal activities. If you had a colposcopy with a biopsy, it is common to have mild pain for a few days and spotting for 48 hours after the procedure. Avoid using douche products, using tampons, and having sex for at least 3 days after the procedure or for as long as told by your health care provider. Get help right away if you have heavy bleeding, severe pain, or signs of infection. This information is not intended to replace advice given to you by your health care provider. Make sure you discuss any questions you have with your health care provider. Document Revised: 12/09/2020 Document Reviewed: 12/09/2020 Elsevier Patient Education  2023 [employer].

## 2022-11-05 LAB — SURGICAL PATHOLOGY

## 2022-11-06 ENCOUNTER — Ambulatory Visit (HOSPITAL_COMMUNITY)
Admission: EM | Admit: 2022-11-06 | Discharge: 2022-11-06 | Disposition: A | Discharge status: Home / Self Care | Payer: Commercial Managed Care - PPO

## 2022-11-06 ENCOUNTER — Encounter (HOSPITAL_COMMUNITY): Payer: Self-pay | Admitting: *Deleted

## 2022-11-06 DIAGNOSIS — J302 Other seasonal allergic rhinitis: Secondary | ICD-10-CM

## 2022-11-06 DIAGNOSIS — B349 Viral infection, unspecified: Secondary | ICD-10-CM | POA: Diagnosis not present

## 2022-11-06 DIAGNOSIS — I10 Essential (primary) hypertension: Secondary | ICD-10-CM | POA: Diagnosis not present

## 2022-11-06 NOTE — ED Triage Notes (Signed)
Pt states she has had sore throat, cough, congestion 2-3 days. She took benadryl.

## 2022-11-06 NOTE — Discharge Instructions (Addendum)
May take otc tylenol, Coricidin HBP, or Claritin or Zyrtec, chloraseptic throat lozenges as lable directed for symptom management.  Take home meds as prescribed. Most liekly this is allergies and viral issues. No antibiotic indicated at present. If symptoms persist follow up with PCP or return as needed.Drink plenty of fluids,rest.

## 2022-11-06 NOTE — ED Provider Notes (Signed)
[facility]    CSN: [id number] Arrival date & time: 11/06/22  2563      History   Chief Complaint Chief Complaint  Patient presents with   Cough   Nasal Congestion   Sore Throat    HPI Diane Wilson is a 42 y.o. female.   42 year old female presents to Urgent Care with chief complaint of sore throat,cough,congestion x 2-3 days.  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  Patient took Benadryl without relief of symptoms  Past medical history of hypertension, diabetes, GERD.  The history is provided by the patient. No language interpreter was used.    Past Medical History:  Diagnosis Date   Elevated hemoglobin A1c 04/08/2022   level 5.8 wtih PCP   Frequent headaches    HTN (hypertension)    Mitral valve prolapse     Patient Active Problem List   Diagnosis Date Noted   Seasonal allergies 11/06/2022   Viral illness 11/06/2022   Elevated blood pressure reading with diagnosis of hypertension 11/06/2022   Routine general medical examination at a health care facility 11/24/2019   IUD check up 11/02/2015   Prediabetes 11/02/2015   Mitral valve prolapse 12/18/2011    Past Surgical History:  Procedure Laterality Date   CESAREAN SECTION     TUBAL LIGATION      OB History     Gravida  2   Para  2   Term  2   Preterm      AB      Living  2      SAB      IAB      Ectopic      Multiple      Live Births  2            Home Medications    Prior to Admission medications   Medication Sig Start Date End Date Taking? Authorizing Provider  Cholecalciferol (VITAMIN D3) 1.25 MG (50000 UT) CAPS Take 1 capsule by mouth once a week. 01/20/22  Yes [provider]  CVS IRON 325 (65 Fe) MG tablet Take 325 mg by mouth daily. 02/18/22  Yes [provider]  metFORMIN (GLUCOPHAGE-XR) 500 MG 24 hr tablet SMARTSIG:1 Tablet(s) By Mouth Every Evening 01/27/22  Yes [provider]  pantoprazole  (PROTONIX) 20 MG tablet Take 1 tablet (20 mg total) by mouth daily. 10/10/22  Yes Carlisle Beers, FNP  valsartan (DIOVAN) 80 MG tablet Take 80 mg by mouth daily.   Yes [provider]  losartan (COZAAR) 25 MG tablet Take 25 mg by mouth daily.    [provider]  TRULICITY 0.75 MG/0.5ML SOPN Inject 0.75 mg into the skin once a week. Patient not taking: Reported on 11/04/2022 03/26/22   [provider]    Family History Family History  Problem Relation Age of Onset   Cancer Father    Breast cancer Sister 58   Cancer Maternal Aunt    Diabetes Maternal Aunt    Cancer Maternal Aunt    Diabetes Maternal Aunt    Diabetes Maternal Uncle    Diabetes Maternal Grandmother    Diabetes Maternal Grandfather    Diabetes Cousin    Diabetes Other    Hypertension Other     Social History Social History   Tobacco Use   Smoking status: Never   Smokeless tobacco: Never  Vaping Use   Vaping Use: Never used  Substance  Use Topics   Alcohol use: No   Drug use: No     Allergies   Codeine, Diclofenac, Hydrocodone-acetaminophen, Meloxicam, and Oxycontin [oxycodone hcl]   Review of Systems Review of Systems  HENT:  Positive for congestion and sore throat.   Respiratory:  Positive for cough. Negative for shortness of breath.   Cardiovascular:  Negative for chest pain and palpitations.  Gastrointestinal:  Negative for abdominal pain.  All other systems reviewed and are negative.    Physical Exam Triage Vital Signs ED Triage Vitals  Enc Vitals Group     BP 11/06/22 0821 (!) 143/100     Pulse Rate 11/06/22 0821 90     Resp 11/06/22 0821 18     Temp 11/06/22 0821 99.2 F (37.3 C)     Temp Source 11/06/22 0821 Oral     SpO2 11/06/22 0821 98 %     Weight --      Height --      Head Circumference --      Peak Flow --      Pain Score 11/06/22 0819 8     Pain Loc --      Pain Edu? --      Excl. in GC? --    No data found.  Updated Vital Signs BP (!)  143/100 (BP Location: Right Arm)   Pulse 90   Temp 99.2 F (37.3 C) (Oral)   Resp 18   LMP 10/15/2022   SpO2 98%   Visual Acuity Right Eye Distance:   Left Eye Distance:   Bilateral Distance:    Right Eye Near:   Left Eye Near:    Bilateral Near:     Physical Exam Vitals and nursing note reviewed.  Constitutional:      General: She is not in acute distress.    Appearance: She is well-developed.  HENT:     Head: Normocephalic.     Right Ear: Tympanic membrane is retracted.     Left Ear: Tympanic membrane is retracted.     Nose: Congestion present.     Mouth/Throat:     Lips: Pink.     Mouth: Mucous membranes are moist.     Pharynx: Oropharynx is clear.  Eyes:     General: Lids are normal.     Conjunctiva/sclera: Conjunctivae normal.     Pupils: Pupils are equal, round, and reactive to light.  Neck:     Trachea: No tracheal deviation.  Cardiovascular:     Rate and Rhythm: Regular rhythm.     Pulses: Normal pulses.     Heart sounds: Normal heart sounds. No murmur heard. Pulmonary:     Effort: Pulmonary effort is normal.     Breath sounds: Normal breath sounds.  Abdominal:     General: Bowel sounds are normal.     Palpations: Abdomen is soft.     Tenderness: There is no abdominal tenderness.  Musculoskeletal:        General: Normal range of motion.     Cervical back: Normal range of motion.  Lymphadenopathy:     Cervical: No cervical adenopathy.  Skin:    General: Skin is warm and dry.     Findings: No rash.  Neurological:     General: No focal deficit present.     Mental Status: She is alert and oriented to person, place, and time.     GCS: GCS eye subscore is 4. GCS verbal subscore is 5. GCS motor subscore is 6.  Psychiatric:        Attention and Perception: Attention normal.        Mood and Affect: Mood normal.        Speech: Speech normal.        Behavior: Behavior normal. Behavior is cooperative.      UC Treatments / Results  Labs (all labs  ordered are listed, but only abnormal results are displayed) Labs Reviewed - No data to display  EKG   Radiology No results found.  Procedures Procedures (including critical care time)  Medications Ordered in UC Medications - No data to display  Initial Impression / Assessment and Plan / UC Course  I have reviewed the triage vital signs and the nursing notes.  Pertinent labs & imaging results that were available during my care of the patient were reviewed by me and considered in my medical decision making (see chart for details).     Patient verbalized understanding to this provider, requesting a work note(given)  Ddx: Viral illness, seasonal allergies Final Clinical Impressions(s) / UC Diagnoses   Final diagnoses:  Seasonal allergies  Viral illness  Elevated blood pressure reading with diagnosis of hypertension     Discharge Instructions      May take otc tylenol, Coricidin HBP, or Claritin or Zyrtec, chloraseptic throat lozenges as lable directed for symptom management.  Take home meds as prescribed. Most liekly this is allergies and viral issues. No antibiotic indicated at present. If symptoms persist follow up with PCP or return as needed.Drink plenty of fluids,rest.     ED Prescriptions   None    PDMP not reviewed this encounter.   Clancy Gourdefelice, Kela Baccari, NP 11/06/22 [phone]

## 2022-11-07 LAB — CYTOLOGY - PAP
Comment: NEGATIVE
Diagnosis: UNDETERMINED — AB
High risk HPV: NEGATIVE

## 2023-01-06 ENCOUNTER — Telehealth: Payer: Self-pay

## 2023-01-06 DIAGNOSIS — N921 Excessive and frequent menstruation with irregular cycle: Secondary | ICD-10-CM

## 2023-01-06 NOTE — Telephone Encounter (Signed)
Patient called yesterday & left a message on the triage voicemail. She stated that she was to originally have a hysterectomy but had decided against it & she wanted to try the birth control that Dr. Edward Jolly had discussed with her at her previous appointment. Patient has not had a returned phone call yet. Routing to triage.

## 2023-01-06 NOTE — Telephone Encounter (Signed)
LDVM on pt's machine inquiring if she didn't mind calling us back just to confirm that she wants to try POP option versus surgical options for her menorrhagia and if so, if she could confirm her pharmacy.

## 2023-01-08 MED ORDER — NORETHINDRONE 0.35 MG PO TABS: 1.0000 | ORAL_TABLET | Freq: Every day | ORAL | 3 refills | Status: DC

## 2023-01-08 NOTE — Telephone Encounter (Signed)
Pt notified and voiced understanding. 4 month recall placed in chart. Will route to provider for final review/authorization on rx and close.

## 2023-01-08 NOTE — Telephone Encounter (Signed)
Called pt, someone else answered and said she was not available at the moment. I asked if they could advise her to call her Drs office back. They voiced understanding.

## 2023-01-08 NOTE — Telephone Encounter (Signed)
Pt returned call stating that she would like to try the pill option that was discussed. Please advise.

## 2023-01-08 NOTE — Telephone Encounter (Signed)
Ok for a trial of Micronor progesterone only birth control x 4 months.  Please educate her to take a pill every day at the same time.  She has a  3 hour window of time to take the medication every day.  There are no placebo pills at the end of the pack.   Please schedule an annual exam and follow up with me for October, 2024.

## 2023-02-22 ENCOUNTER — Encounter (HOSPITAL_COMMUNITY): Payer: Self-pay

## 2023-02-22 ENCOUNTER — Ambulatory Visit (HOSPITAL_COMMUNITY)
Admission: EM | Admit: 2023-02-22 | Discharge: 2023-02-22 | Disposition: A | Discharge status: Home / Self Care | Payer: Commercial Managed Care - PPO | Attending: Emergency Medicine | Admitting: Emergency Medicine

## 2023-02-22 DIAGNOSIS — M25561 Pain in right knee: Secondary | ICD-10-CM | POA: Diagnosis not present

## 2023-02-22 MED ORDER — NAPROXEN 500 MG PO TABS: 500.0000 mg | ORAL_TABLET | Freq: Two times a day (BID) | ORAL | 0 refills | Status: DC

## 2023-02-22 NOTE — ED Triage Notes (Signed)
Pt presents to the office for right knee pain x 2 months. Denies any injuries or falls.

## 2023-02-22 NOTE — Discharge Instructions (Signed)
Rest - try to avoid heavy lifting and high impact activity Ice - apply for 20 minutes a few times daily Elevation - prop up on a pillow  Try the naproxen twice daily Take with food  Please follow up with orthopedics. They have walk-in hours or you can call to make an appointment.

## 2023-02-22 NOTE — ED Provider Notes (Signed)
[facility]    CSN: [id number] Arrival date & time: 02/22/23  1002      History   Chief Complaint Chief Complaint  Patient presents with   Knee Pain    HPI Diane Wilson is a 42 y.o. female. 2 month history of right knee pain Seen at [facility], had negative xrays Tried Voltaren gel without relief No injury, trauma, or falls No prior history of knee pain  Past Medical History:  Diagnosis Date   Elevated hemoglobin A1c 04/08/2022   level 5.8 wtih PCP   Frequent headaches    HTN (hypertension)    Mitral valve prolapse     Patient Active Problem List   Diagnosis Date Noted   Seasonal allergies 11/06/2022   Viral illness 11/06/2022   Elevated blood pressure reading with diagnosis of hypertension 11/06/2022   Routine general medical examination at a health care facility 11/24/2019   IUD check up 11/02/2015   Prediabetes 11/02/2015   Mitral valve prolapse 12/18/2011    Past Surgical History:  Procedure Laterality Date   CESAREAN SECTION     TUBAL LIGATION      OB History     Gravida  2   Para  2   Term  2   Preterm      AB      Living  2      SAB      IAB      Ectopic      Multiple      Live Births  2            Home Medications    Prior to Admission medications   Medication Sig Start Date End Date Taking? Authorizing Provider  Cholecalciferol (VITAMIN D3) 1.25 MG (50000 UT) CAPS Take 1 capsule by mouth once a week. 01/20/22  Yes [provider]  [facility] IRON 325 (65 Fe) MG tablet Take 325 mg by mouth daily. 02/18/22  Yes [provider]  metFORMIN (GLUCOPHAGE-XR) 500 MG 24 hr tablet SMARTSIG:1 Tablet(s) By Mouth Every Evening 01/27/22  Yes [provider]  naproxen (NAPROSYN) 500 MG tablet Take 1 tablet (500 mg total) by mouth 2 (two) times daily. 02/22/23  Yes Hasaan Radde, Lurena Joiner, PA-C  norethindrone (MICRONOR) 0.35 MG tablet Take 1 tablet (0.35 mg total) by mouth daily. 01/08/23  Yes  Amundson Shirley Friar, MD  pantoprazole (PROTONIX) 20 MG tablet Take 1 tablet (20 mg total) by mouth daily. 10/10/22  Yes Carlisle Beers, FNP  valsartan (DIOVAN) 80 MG tablet Take 80 mg by mouth daily.   Yes [provider]    Family History Family History  Problem Relation Age of Onset   Cancer Father    Breast cancer Sister 52   Cancer Maternal Aunt    Diabetes Maternal Aunt    Cancer Maternal Aunt    Diabetes Maternal Aunt    Diabetes Maternal Uncle    Diabetes Maternal Grandmother    Diabetes Maternal Grandfather    Diabetes Cousin    Diabetes Other    Hypertension Other     Social History Social History   Tobacco Use   Smoking status: Never   Smokeless tobacco: Never  Vaping Use   Vaping status: Never Used  Substance Use Topics   Alcohol use: No   Drug use: No     Allergies   Codeine, Diclofenac, Hydrocodone-acetaminophen, Meloxicam, and Oxycontin [oxycodone hcl]   Review of Systems Review of  Systems Per HPI  Physical Exam Triage Vital Signs ED Triage Vitals  Encounter Vitals Group     BP 02/22/23 1019 132/85     Systolic BP Percentile --      Diastolic BP Percentile --      Pulse Rate 02/22/23 1019 75     Resp 02/22/23 1019 16     Temp 02/22/23 1019 98.1 F (36.7 C)     Temp Source 02/22/23 1019 Oral     SpO2 02/22/23 1019 97 %     Weight --      Height --      Head Circumference --      Peak Flow --      Pain Score 02/22/23 1021 5     Pain Loc --      Pain Education --      Exclude from Growth Chart --    No data found.  Updated Vital Signs BP 132/85 (BP Location: Left Arm)   Pulse 75   Temp 98.1 F (36.7 C) (Oral)   Resp 16   LMP 02/22/2023   SpO2 97%    Physical Exam Vitals and nursing note reviewed.  Constitutional:      General: She is not in acute distress. HENT:     Mouth/Throat:     Pharynx: Oropharynx is clear.  Cardiovascular:     Rate and Rhythm: Normal rate and regular rhythm.     Pulses:  Normal pulses.  Pulmonary:     Effort: Pulmonary effort is normal.  Musculoskeletal:        General: No swelling, tenderness, deformity or signs of injury.     Cervical back: Normal range of motion.     Right lower leg: No edema.     Left lower leg: No edema.     Comments: Full ROM of bilat lower extremities. Pain with full extension right knee. No bony tenderness, crepitus, swelling, deformity. Distal sensation intact. Strong DP pulse. Cap refill < 2 seconds   Skin:    Capillary Refill: Capillary refill takes less than 2 seconds.  Neurological:     Mental Status: She is alert and oriented to person, place, and time.     Gait: Gait normal.     UC Treatments / Results  Labs (all labs ordered are listed, but only abnormal results are displayed) Labs Reviewed - No data to display  EKG  Radiology No results found.  Procedures Procedures   Medications Ordered in UC Medications - No data to display  Initial Impression / Assessment and Plan / UC Course  I have reviewed the triage vital signs and the nursing notes.  Pertinent labs & imaging results that were available during my care of the patient were reviewed by me and considered in my medical decision making (see chart for details).  Right knee pain. No new injury or trauma, no indication to repeat imaging. Needs follow up with ortho Ice and elevate Meloxicam causes her ankles to swell. Has used naproxen in the past without issue. Will try BID. Provided with ortho info Patient agreeable to plan, no questions at this time  Final Clinical Impressions(s) / UC Diagnoses   Final diagnoses:  Acute pain of right knee     Discharge Instructions      Rest - try to avoid heavy lifting and high impact activity Ice - apply for 20 minutes a few times daily Elevation - prop up on a pillow  Try the naproxen twice daily Take  with food  Please follow up with orthopedics. They have walk-in hours or you can call to make an  appointment.      ED Prescriptions     Medication Sig Dispense Auth. Provider   naproxen (NAPROSYN) 500 MG tablet Take 1 tablet (500 mg total) by mouth 2 (two) times daily. 30 tablet Tonimarie Gritz, Lurena Joiner, PA-C      PDMP not reviewed this encounter.   Kathrine Haddock 02/22/23 1131

## 2023-03-11 ENCOUNTER — Ambulatory Visit (HOSPITAL_COMMUNITY)
Admission: EM | Admit: 2023-03-11 | Discharge: 2023-03-11 | Disposition: A | Discharge status: Home / Self Care | Payer: Commercial Managed Care - PPO | Attending: Family Medicine | Admitting: Family Medicine

## 2023-03-11 ENCOUNTER — Encounter (HOSPITAL_COMMUNITY): Payer: Self-pay | Admitting: *Deleted

## 2023-03-11 DIAGNOSIS — N3090 Cystitis, unspecified without hematuria: Secondary | ICD-10-CM

## 2023-03-11 LAB — POCT URINALYSIS DIP (MANUAL ENTRY)
Bilirubin, UA: NEGATIVE
Glucose, UA: NEGATIVE mg/dL
Ketones, POC UA: NEGATIVE mg/dL
Nitrite, UA: POSITIVE — AB
Protein Ur, POC: 30 mg/dL — AB
Spec Grav, UA: 1.02 (ref 1.010–1.025)
Urobilinogen, UA: 0.2 E.U./dL
pH, UA: 7 (ref 5.0–8.0)

## 2023-03-11 MED ORDER — PHENAZOPYRIDINE HCL 200 MG PO TABS: 200.0000 mg | ORAL_TABLET | Freq: Three times a day (TID) | ORAL | 0 refills | Status: DC

## 2023-03-11 MED ORDER — CEPHALEXIN 500 MG PO CAPS: 500.0000 mg | ORAL_CAPSULE | Freq: Two times a day (BID) | ORAL | 0 refills | Status: DC

## 2023-03-11 NOTE — ED Triage Notes (Signed)
Pt states she started to notice she had darker urine on Monday and since that has resolved but she has dysuria and urine frequency. She has been drinking cranberry juice without results.

## 2023-03-12 NOTE — ED Provider Notes (Signed)
[facility]    ASSESSMENT & PLAN:  1. Cystitis     Meds ordered this encounter  Medications   cephALEXin (KEFLEX) 500 MG capsule    Sig: Take 1 capsule (500 mg total) by mouth 2 (two) times daily.    Dispense:  10 capsule    Refill:  0   phenazopyridine (PYRIDIUM) 200 MG tablet    Sig: Take 1 tablet (200 mg total) by mouth 3 (three) times daily.    Dispense:  6 tablet    Refill:  0   No signs of pyelonephritis. Urine culture sent. Will notify patient of any significant results. Ensure proper hydration. Will follow up with her PCP or here if not showing improvement over the next 48 hours, sooner if needed.  Outlined signs and symptoms indicating need for more acute intervention. Patient verbalized understanding. After Visit Summary given.  SUBJECTIVE:  Diane Wilson is a 42 y.o. female who complains of urinary frequency, urgency and dysuria for the past 1-2 days. Without associated flank pain, fever, chills, vaginal discharge or bleeding. Gross hematuria: not present. No specific aggravating or alleviating factors reported. No LE edema. Normal PO intake without n/v/d. Without specific abdominal pain. Ambulatory without difficulty. OTC treatment: cranberry juice without relief. H/O UTI: infrequent. LMP: Patient's last menstrual period was 02/22/2023.  OBJECTIVE:  Vitals:   03/11/23 1932  BP: (!) 142/91  Pulse: 69  Resp: 18  Temp: 98.6 F (37 C)  TempSrc: Oral  SpO2: 98%   General appearance: alert; no distress HENT: oropharynx: moist Lungs: unlabored respirations Abdomen: soft Extremities: no edema; symmetrical with no gross deformities Skin: warm and dry Neurologic: normal gait Psychological: alert and cooperative; normal mood and affect  Labs Reviewed  POCT URINALYSIS DIP (MANUAL ENTRY) - Abnormal; Notable for the following components:      Result Value   Color, UA straw (*)    Clarity, UA cloudy (*)    Blood, UA trace-intact (*)    Protein  Ur, POC =30 (*)    Nitrite, UA Positive (*)    Leukocytes, UA Moderate (2+) (*)    All other components within normal limits  URINE CULTURE    Allergies  Allergen Reactions   Codeine Nausea And Vomiting   Diclofenac Swelling   Hydrocodone-Acetaminophen Nausea And Vomiting   Meloxicam Swelling   Oxycontin [Oxycodone Hcl] Nausea And Vomiting    Past Medical History:  Diagnosis Date   Elevated hemoglobin A1c 04/08/2022   level 5.8 wtih PCP   Frequent headaches    HTN (hypertension)    Mitral valve prolapse    Social History   Socioeconomic History   Marital status: Married    Spouse name: Not on file   Number of children: 2   Years of education: Not on file   Highest education level: Not on file  Occupational History    Employer: [employer]    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  Tobacco Use   Smoking status: Never   Smokeless tobacco: Never  Vaping Use   Vaping status: Never Used  Substance and Sexual Activity   Alcohol use: No   Drug use: No   Sexual activity: Yes    Partners: Male    Birth control/protection: None    Comment: BTL  Other Topics Concern   Not on file  Social History Narrative   Not on file   Social Determinants of Health   Financial Resource Strain: Not on file  Food Insecurity: Not on  file  Transportation Needs: Not on file  Physical Activity: Not on file  Stress: Not on file  Social Connections: Not on file  Intimate Partner Violence: Not on file   Family History  Problem Relation Age of Onset   Cancer Father    Breast cancer Sister 86   Cancer Maternal Aunt    Diabetes Maternal Aunt    Cancer Maternal Aunt    Diabetes Maternal Aunt    Diabetes Maternal Uncle    Diabetes Maternal Grandmother    Diabetes Maternal Grandfather    Diabetes Cousin    Diabetes Other    Hypertension Other         Mardella Layman, MD 03/12/23 1101

## 2023-03-14 LAB — URINE CULTURE: Culture: >=100000 — AB

## 2023-03-26 ENCOUNTER — Encounter: Payer: Self-pay | Admitting: Obstetrics and Gynecology

## 2023-03-26 NOTE — Telephone Encounter (Signed)
Spoke with patient. Started POP June 2024, stopped approximately February 14, 2023 due to menses starting again, did not restart. LMP 03/12/23, still on menses, flow is light, changes regular tampon once daily. Reports increase in "charlie horse" cramps at night. Denies any other symptoms. Asking of she should restart POP? Why bleeding is irregular?   Hx BTL.   Advised contraceptives take at least 3 months for cycles to adjust. Important to take daily, roughly same time, no missed pills. Recommended scheduling AEX and calendar menses. AEX scheduled for 108/24 at 1400. Advised if any new symptoms develop or bleeding becomes heavy, return call to office for OV. OK to restart POP if she chooses, reminded to take daily and no missed pills. Will provide update to Dr. Edward Jolly and return call if any additional recommendations. Patient verbalizes understanding and is agreeable.   Routing to provider for final review. Patient is agreeable to disposition. Will close encounter.

## 2023-03-28 ENCOUNTER — Other Ambulatory Visit: Payer: Self-pay | Admitting: Obstetrics and Gynecology

## 2023-03-28 DIAGNOSIS — N921 Excessive and frequent menstruation with irregular cycle: Secondary | ICD-10-CM

## 2023-03-31 NOTE — Telephone Encounter (Signed)
Med refill request: POPs Last AEX: never Next AEX: 05/05/2023 Last MMG (if hormonal med): 04/02/2022-birads 0; 05/01/2022-Dx Rt-neg birads 1 Refill authorized: rx pend  Per request rx filled on 01/08/2023.  Per request: rx was discontinued by MD from UC visit on 03/11/2023.   LDVM on machine per DPR advising pt to cb and let us know if she is still taking this medication or if she had stopped for any reason.

## 2023-04-02 NOTE — Telephone Encounter (Signed)
Pharmacy requesting f/u.   No response or cb from pt.   Will refuse rx request for now and send note to pharmacy that pt will need to contact provider first for future refills.   Routing to provider for final review.

## 2023-04-03 ENCOUNTER — Encounter (HOSPITAL_COMMUNITY): Payer: Self-pay

## 2023-04-03 ENCOUNTER — Ambulatory Visit (HOSPITAL_COMMUNITY)
Admission: EM | Admit: 2023-04-03 | Discharge: 2023-04-03 | Disposition: A | Discharge status: Home / Self Care | Payer: Commercial Managed Care - PPO | Attending: Physician Assistant | Admitting: Physician Assistant

## 2023-04-03 DIAGNOSIS — K219 Gastro-esophageal reflux disease without esophagitis: Secondary | ICD-10-CM

## 2023-04-03 MED ORDER — LIDOCAINE VISCOUS HCL 2 % MT SOLN
15.0000 mL | Freq: Once | OROMUCOSAL | Status: AC
  Administered 2023-04-03: 15 mL via OROMUCOSAL

## 2023-04-03 MED ORDER — LIDOCAINE VISCOUS HCL 2 % MT SOLN
OROMUCOSAL | Status: AC
  Filled 2023-04-03: qty 15

## 2023-04-03 MED ORDER — ALUM & MAG HYDROXIDE-SIMETH 200-200-20 MG/5ML PO SUSP
30.0000 mL | Freq: Once | ORAL | Status: AC
  Administered 2023-04-03: 30 mL via ORAL

## 2023-04-03 MED ORDER — ALUM & MAG HYDROXIDE-SIMETH 200-200-20 MG/5ML PO SUSP
ORAL | Status: AC
  Filled 2023-04-03: qty 30

## 2023-04-03 MED ORDER — OMEPRAZOLE 40 MG PO CPDR: 40.0000 mg | DELAYED_RELEASE_CAPSULE | Freq: Every day | ORAL | 0 refills | Status: DC

## 2023-04-03 NOTE — ED Provider Notes (Signed)
[facility]    CSN: [id number] Arrival date & time: 04/03/23  1823      History   Chief Complaint Chief Complaint  Patient presents with   Gastroesophageal Reflux    HPI Diane Wilson is a 42 y.o. female.   Patient complains of acid reflux that started several days ago.  She reports she was on a vacation and ate hot spicy food with persistent reflux and epigastric discomfort since then.  She has tried mylanta, famotadine, omeprazole, tums with no relief. She denies nausea or vomiting, denies chest pain.  She has tried changing her diet over the last few days as well with minimal relief.    Past Medical History:  Diagnosis Date   Elevated hemoglobin A1c 04/08/2022   level 5.8 wtih PCP   Frequent headaches    HTN (hypertension)    Mitral valve prolapse     Patient Active Problem List   Diagnosis Date Noted   Seasonal allergies 11/06/2022   Viral illness 11/06/2022   Elevated blood pressure reading with diagnosis of hypertension 11/06/2022   Routine general medical examination at a health care facility 11/24/2019   IUD check up 11/02/2015   Prediabetes 11/02/2015   Mitral valve prolapse 12/18/2011    Past Surgical History:  Procedure Laterality Date   CESAREAN SECTION     TUBAL LIGATION      OB History     Gravida  2   Para  2   Term  2   Preterm      AB      Living  2      SAB      IAB      Ectopic      Multiple      Live Births  2            Home Medications    Prior to Admission medications   Medication Sig Start Date End Date Taking? Authorizing Provider  omeprazole (PRILOSEC) 40 MG capsule Take 1 capsule (40 mg total) by mouth daily. 04/03/23 05/03/23 Yes Ward, Tylene Fantasia, PA-C  cephALEXin (KEFLEX) 500 MG capsule Take 1 capsule (500 mg total) by mouth 2 (two) times daily. 03/11/23   Mardella Layman, MD  Cholecalciferol (VITAMIN D3) 1.25 MG (50000 UT) CAPS Take 1 capsule by mouth once a week. 01/20/22   [provider]  [facility] IRON 325 (65 Fe) MG tablet Take 325 mg by mouth daily. 02/18/22   [provider]  folic acid (FOLVITE) 400 MCG tablet 1 tablet Orally Once a day    [provider]  naproxen (NAPROSYN) 500 MG tablet Take 1 tablet (500 mg total) by mouth 2 (two) times daily. 02/22/23   Rising, Lurena Joiner, PA-C  phenazopyridine (PYRIDIUM) 200 MG tablet Take 1 tablet (200 mg total) by mouth 3 (three) times daily. 03/11/23   Mardella Layman, MD  valsartan (DIOVAN) 80 MG tablet Take 80 mg by mouth daily.    [provider]    Family History Family History  Problem Relation Age of Onset   Cancer Father    Breast cancer Sister 3   Cancer Maternal Aunt    Diabetes Maternal Aunt    Cancer Maternal Aunt    Diabetes Maternal Aunt    Diabetes Maternal Uncle    Diabetes Maternal Grandmother    Diabetes Maternal Grandfather    Diabetes Cousin    Diabetes Other    Hypertension Other     Social History  Social History   Tobacco Use   Smoking status: Never   Smokeless tobacco: Never  Vaping Use   Vaping status: Never Used  Substance Use Topics   Alcohol use: No   Drug use: No     Allergies   Codeine, Diclofenac, Hydrocodone-acetaminophen, Meloxicam, and Oxycontin [oxycodone hcl]   Review of Systems Review of Systems  Constitutional:  Negative for chills and fever.  HENT:  Negative for ear pain and sore throat.   Eyes:  Negative for pain and visual disturbance.  Respiratory:  Negative for cough and shortness of breath.   Cardiovascular:  Negative for chest pain and palpitations.  Gastrointestinal:  Negative for abdominal pain, diarrhea and vomiting.  Genitourinary:  Negative for dysuria and hematuria.  Musculoskeletal:  Negative for arthralgias and back pain.  Skin:  Negative for color change and rash.  Neurological:  Negative for seizures and syncope.  All other systems reviewed and are negative.    Physical Exam Triage Vital Signs ED Triage Vitals [04/03/23  1910]  Encounter Vitals Group     BP (!) 154/94     Systolic BP Percentile      Diastolic BP Percentile      Pulse Rate 78     Resp 16     Temp 98.7 F (37.1 C)     Temp Source Oral     SpO2 95 %     Weight      Height      Head Circumference      Peak Flow      Pain Score      Pain Loc      Pain Education      Exclude from Growth Chart    No data found.  Updated Vital Signs BP (!) 154/94 (BP Location: Left Arm)   Pulse 78   Temp 98.7 F (37.1 C) (Oral)   Resp 16   LMP 03/12/2023 (Approximate)   SpO2 95%   Visual Acuity Right Eye Distance:   Left Eye Distance:   Bilateral Distance:    Right Eye Near:   Left Eye Near:    Bilateral Near:     Physical Exam Vitals and nursing note reviewed.  Constitutional:      General: She is not in acute distress.    Appearance: She is well-developed.  HENT:     Head: Normocephalic and atraumatic.  Eyes:     Conjunctiva/sclera: Conjunctivae normal.  Cardiovascular:     Rate and Rhythm: Normal rate and regular rhythm.     Heart sounds: No murmur heard. Pulmonary:     Effort: Pulmonary effort is normal. No respiratory distress.     Breath sounds: Normal breath sounds.  Abdominal:     Palpations: Abdomen is soft.     Tenderness: There is no abdominal tenderness.  Musculoskeletal:        General: No swelling.     Cervical back: Neck supple.  Skin:    General: Skin is warm and dry.     Capillary Refill: Capillary refill takes less than 2 seconds.  Neurological:     Mental Status: She is alert.  Psychiatric:        Mood and Affect: Mood normal.      UC Treatments / Results  Labs (all labs ordered are listed, but only abnormal results are displayed) Labs Reviewed - No data to display  EKG   Radiology No results found.  Procedures Procedures (including critical care time)  Medications Ordered  in UC Medications  alum & mag hydroxide-simeth (MAALOX/MYLANTA) 200-200-20 MG/5ML suspension 30 mL (30 mLs Oral  Given 04/03/23 1936)  lidocaine (XYLOCAINE) 2 % viscous mouth solution 15 mL (15 mLs Mouth/Throat Given 04/03/23 1943)    Initial Impression / Assessment and Plan / UC Course  I have reviewed the triage vital signs and the nursing notes.  Pertinent labs & imaging results that were available during my care of the patient were reviewed by me and considered in my medical decision making (see chart for details).     GI cocktail given in clinic today, minimal relief.  Omeprazole prescribed.  Patient without chest pain, shortness of breath, cardiac symptoms.  Discussed diet changes.  Advise follow-up with primary care physician if no improvement. Final Clinical Impressions(s) / UC Diagnoses   Final diagnoses:  Gastroesophageal reflux disease without esophagitis     Discharge Instructions      Take omeprazole as prescribed Can continue tums as needed.  Drink plenty of water     ED Prescriptions     Medication Sig Dispense Auth. Provider   omeprazole (PRILOSEC) 40 MG capsule Take 1 capsule (40 mg total) by mouth daily. 30 capsule Ward, Tylene Fantasia, PA-C      PDMP not reviewed this encounter.   Ward, Tylene Fantasia, PA-C 04/03/23 1955

## 2023-04-03 NOTE — ED Triage Notes (Signed)
Pt stated history of reflux over the last couple of days.

## 2023-04-03 NOTE — Discharge Instructions (Addendum)
Take omeprazole as prescribed Can continue tums as needed.  Drink plenty of water

## 2023-04-05 ENCOUNTER — Ambulatory Visit (HOSPITAL_COMMUNITY): Payer: Self-pay

## 2023-04-22 NOTE — Progress Notes (Deleted)
42 y.o. G33P2002 Married [place] American female here for annual exam.    PCP:     No LMP recorded.           Sexually active: {yes no:314532}  The current method of family planning is {contraception:315051}.    Exercising: {yes no:314532}  {types:19826} Smoker:  no  Health Maintenance: Pap:  11/04/22 ASCUS: HR HPV neg, 03/24/22 atypical endocervical NOS: HR HPV neg, 11/17/19 ASCUS: HR HPV neg History of abnormal Pap:  yes MMG:  05/01/22 Breast Density Cat B, BI-RADS CAT 1 neg Colonoscopy:  n/a BMD:   n/a  Result  n/a TDaP:  07/29/11 Gardasil:   no HIV: 11/17/16 NR Hep C: 11/17/16 neg Screening Labs:  Hb today: ***, Urine today: ***   reports that she has never smoked. She has never used smokeless tobacco. She reports that she does not drink alcohol and does not use drugs.  Past Medical History:  Diagnosis Date   Elevated hemoglobin A1c 04/08/2022   level 5.8 wtih PCP   Frequent headaches    HTN (hypertension)    Mitral valve prolapse     Past Surgical History:  Procedure Laterality Date   CESAREAN SECTION     TUBAL LIGATION      Current Outpatient Medications  Medication Sig Dispense Refill   cephALEXin (KEFLEX) 500 MG capsule Take 1 capsule (500 mg total) by mouth 2 (two) times daily. 10 capsule 0   Cholecalciferol (VITAMIN D3) 1.25 MG (50000 UT) CAPS Take 1 capsule by mouth once a week.     [employer]S IRON 325 (65 Fe) MG tablet Take 325 mg by mouth daily.     folic acid (FOLVITE) 400 MCG tablet 1 tablet Orally Once a day     naproxen (NAPROSYN) 500 MG tablet Take 1 tablet (500 mg total) by mouth 2 (two) times daily. 30 tablet 0   omeprazole (PRILOSEC) 40 MG capsule Take 1 capsule (40 mg total) by mouth daily. 30 capsule 0   phenazopyridine (PYRIDIUM) 200 MG tablet Take 1 tablet (200 mg total) by mouth 3 (three) times daily. 6 tablet 0   valsartan (DIOVAN) 80 MG tablet Take 80 mg by mouth daily.     No current facility-administered medications for this visit.    Family History   Problem Relation Age of Onset   Cancer Father    Breast cancer Sister 51   Cancer Maternal Aunt    Diabetes Maternal Aunt    Cancer Maternal Aunt    Diabetes Maternal Aunt    Diabetes Maternal Uncle    Diabetes Maternal Grandmother    Diabetes Maternal Grandfather    Diabetes Cousin    Diabetes Other    Hypertension Other     Review of Systems  Exam:   There were no vitals taken for this visit.    General appearance: alert, cooperative and appears stated age Head: normocephalic, without obvious abnormality, atraumatic Neck: no adenopathy, supple, symmetrical, trachea midline and thyroid normal to inspection and palpation Lungs: clear to auscultation bilaterally Breasts: normal appearance, no masses or tenderness, No nipple retraction or dimpling, No nipple discharge or bleeding, No axillary adenopathy Heart: regular rate and rhythm Abdomen: soft, non-tender; no masses, no organomegaly Extremities: extremities normal, atraumatic, no cyanosis or edema Skin: skin color, texture, turgor normal. No rashes or lesions Lymph nodes: cervical, supraclavicular, and axillary nodes normal. Neurologic: grossly normal  Pelvic: External genitalia:  no lesions  No abnormal inguinal nodes palpated.              Urethra:  normal appearing urethra with no masses, tenderness or lesions              Bartholins and Skenes: normal                 Vagina: normal appearing vagina with normal color and discharge, no lesions              Cervix: no lesions              Pap taken: {yes no:314532} Bimanual Exam:  Uterus:  normal size, contour, position, consistency, mobility, non-tender              Adnexa: no mass, fullness, tenderness              Rectal exam: {yes no:314532}.  Confirms.              Anus:  normal sphincter tone, no lesions  Chaperone was present for exam:  ***  Assessment:   Well woman visit with gynecologic exam.   Plan: Mammogram screening discussed. Self  breast awareness reviewed. Pap and HR HPV as above. Guidelines for Calcium, Vitamin D, regular exercise program including cardiovascular and weight bearing exercise.   Follow up annually and prn.   Additional counseling given.  {yes [id number]. _______ minutes face to face time of which over 50% was spent in counseling.    After visit summary provided.

## 2023-05-04 ENCOUNTER — Ambulatory Visit (HOSPITAL_COMMUNITY)
Admission: RE | Admit: 2023-05-04 | Discharge: 2023-05-04 | Disposition: A | Discharge status: Home / Self Care | Payer: Commercial Managed Care - PPO | Source: Ambulatory Visit | Attending: Internal Medicine | Admitting: Internal Medicine

## 2023-05-04 ENCOUNTER — Encounter (HOSPITAL_COMMUNITY): Payer: Self-pay

## 2023-05-04 VITALS — BP 135/84 | HR 79 | Temp 98.3°F | Resp 16

## 2023-05-04 DIAGNOSIS — B9681 Helicobacter pylori [H. pylori] as the cause of diseases classified elsewhere: Secondary | ICD-10-CM | POA: Diagnosis not present

## 2023-05-04 DIAGNOSIS — K2970 Gastritis, unspecified, without bleeding: Secondary | ICD-10-CM

## 2023-05-04 MED ORDER — OMEPRAZOLE 20 MG PO CPDR: 20.0000 mg | DELAYED_RELEASE_CAPSULE | Freq: Two times a day (BID) | ORAL | 1 refills | Status: DC

## 2023-05-04 MED ORDER — CLARITHROMYCIN 500 MG PO TABS: 500.0000 mg | ORAL_TABLET | Freq: Two times a day (BID) | ORAL | 0 refills | Status: AC

## 2023-05-04 MED ORDER — AMOXICILLIN 500 MG PO CAPS: 1000.0000 mg | ORAL_CAPSULE | Freq: Two times a day (BID) | ORAL | 0 refills | Status: AC

## 2023-05-04 NOTE — ED Notes (Signed)
Attempted to call patient once in the lobby but she did not come.

## 2023-05-04 NOTE — Discharge Instructions (Addendum)
Please take medications as prescribed Please avoid spicy food while you are on the triple regimen.  After completion you may return to your regular eating habits. Please maintain adequate hydration If you have worsening symptoms please return to urgent care to be reevaluated If your symptoms persist after a month, you may benefit from gastroenterology evaluation

## 2023-05-04 NOTE — ED Triage Notes (Signed)
Pt presents to office for follow-up on reflux. Pt states the Omeprazole has not help.

## 2023-05-05 ENCOUNTER — Ambulatory Visit: Payer: Commercial Managed Care - PPO | Admitting: Obstetrics and Gynecology

## 2023-05-05 NOTE — ED Provider Notes (Signed)
[facility]    CSN: [id number] Arrival date & time: 05/04/23  1711      History   Chief Complaint Chief Complaint  Patient presents with   Heartburn    I have been having acid reflux for a while now and the prescription that was prescribed for me it's working. Now it's starting to feel like I have a large lump of mucus phlegm in my throat. Everytime I swallow it's goes down but comes back up. - Entered by patient    HPI Diane Wilson is a 42 y.o. female with a history of H. pylori previously treated with quadruple therapy comes to urgent care with complaints of epigastric and lower chest pain over the past several days.  Symptoms started after she ate some spicy food.  She has been taking omeprazole with no improvement in her symptoms.  She has had some belching but no vomiting.  Bowel movements remain unchanged.  No blood in urine.  No melanotic stools.  No weight loss.  Patient recently restarted on omeprazole.  She also complains of increased mucus production but denies any shortness of breath, chest pain or chest pressure.  No wheezing.  No NSAID use on a regular basis.  No Goody powder use.  No alcohol use.  HPI  Past Medical History:  Diagnosis Date   Elevated hemoglobin A1c 04/08/2022   level 5.8 wtih PCP   Frequent headaches    HTN (hypertension)    Mitral valve prolapse     Patient Active Problem List   Diagnosis Date Noted   Seasonal allergies 11/06/2022   Viral illness 11/06/2022   Elevated blood pressure reading with diagnosis of hypertension 11/06/2022   Routine general medical examination at a health care facility 11/24/2019   IUD check up 11/02/2015   Prediabetes 11/02/2015   Mitral valve prolapse 12/18/2011    Past Surgical History:  Procedure Laterality Date   CESAREAN SECTION     TUBAL LIGATION      OB History     Gravida  2   Para  2   Term  2   Preterm      AB      Living  2      SAB      IAB      Ectopic       Multiple      Live Births  2            Home Medications    Prior to Admission medications   Medication Sig Start Date End Date Taking? Authorizing Provider  amoxicillin (AMOXIL) 500 MG capsule Take 2 capsules (1,000 mg total) by mouth 2 (two) times daily for 14 days. 05/04/23 05/18/23 Yes Ryelynn Guedea, Britta Mccreedy, MD  clarithromycin (BIAXIN) 500 MG tablet Take 1 tablet (500 mg total) by mouth 2 (two) times daily for 14 days. 05/04/23 05/18/23 Yes Anginette Espejo, Britta Mccreedy, MD  omeprazole (PRILOSEC) 20 MG capsule Take 1 capsule (20 mg total) by mouth 2 (two) times daily before a meal. 05/04/23 06/03/23 Yes Nidya Bouyer, Britta Mccreedy, MD  [facility] IRON 325 (65 Fe) MG tablet Take 325 mg by mouth daily. 02/18/22   [provider]  folic acid (FOLVITE) 400 MCG tablet 1 tablet Orally Once a day    [provider]  valsartan (DIOVAN) 80 MG tablet Take 80 mg by mouth daily.    [provider]    Family History Family History  Problem Relation Age of Onset  Cancer Father    Breast cancer Sister 66   Cancer Maternal Aunt    Diabetes Maternal Aunt    Cancer Maternal Aunt    Diabetes Maternal Aunt    Diabetes Maternal Uncle    Diabetes Maternal Grandmother    Diabetes Maternal Grandfather    Diabetes Cousin    Diabetes Other    Hypertension Other     Social History Social History   Tobacco Use   Smoking status: Never   Smokeless tobacco: Never  Vaping Use   Vaping status: Never Used  Substance Use Topics   Alcohol use: No   Drug use: No     Allergies   Codeine, Diclofenac, Hydrocodone-acetaminophen, Meloxicam, and Oxycontin [oxycodone hcl]   Review of Systems Review of Systems As per HPI  Physical Exam Triage Vital Signs ED Triage Vitals [05/04/23 1751]  Encounter Vitals Group     BP 135/84     Systolic BP Percentile      Diastolic BP Percentile      Pulse Rate 79     Resp 16     Temp 98.3 F (36.8 C)     Temp Source Oral     SpO2 98 %     Weight      Height       Head Circumference      Peak Flow      Pain Score      Pain Loc      Pain Education      Exclude from Growth Chart    No data found.  Updated Vital Signs BP 135/84 (BP Location: Left Arm)   Pulse 79   Temp 98.3 F (36.8 C) (Oral)   Resp 16   SpO2 98%   Visual Acuity Right Eye Distance:   Left Eye Distance:   Bilateral Distance:    Right Eye Near:   Left Eye Near:    Bilateral Near:     Physical Exam Vitals and nursing note reviewed.  Constitutional:      General: She is not in acute distress.    Appearance: Normal appearance. She is not ill-appearing.  Cardiovascular:     Rate and Rhythm: Normal rate and regular rhythm.     Pulses: Normal pulses.     Heart sounds: Normal heart sounds.  Pulmonary:     Effort: Pulmonary effort is normal.     Breath sounds: Normal breath sounds.  Abdominal:     General: Bowel sounds are normal.     Palpations: Abdomen is soft.  Neurological:     Mental Status: She is alert.      UC Treatments / Results  Labs (all labs ordered are listed, but only abnormal results are displayed) Labs Reviewed - No data to display  EKG   Radiology No results found.  Procedures Procedures (including critical care time)  Medications Ordered in UC Medications - No data to display  Initial Impression / Assessment and Plan / UC Course  I have reviewed the triage vital signs and the nursing notes.  Pertinent labs & imaging results that were available during my care of the patient were reviewed by me and considered in my medical decision making (see chart for details).     1.  Helicobacter pylori gastritis: Clarithromycin, amoxicillin and omeprazole therapy for 14 days Patient advised to maintain adequate hydration Patient advised to avoid NSAID use Return precautions given If patient continues to have abdominal pain in spite of being on medications,  she might benefit from gastroenterology evaluation and possible EGD. Final  Clinical Impressions(s) / UC Diagnoses   Final diagnoses:  Helicobacter pylori gastritis     Discharge Instructions      Please take medications as prescribed Please avoid spicy food while you are on the triple regimen.  After completion you may return to your regular eating habits. Please maintain adequate hydration If you have worsening symptoms please return to urgent care to be reevaluated If your symptoms persist after a month, you may benefit from gastroenterology evaluation    ED Prescriptions     Medication Sig Dispense Auth. Provider   clarithromycin (BIAXIN) 500 MG tablet Take 1 tablet (500 mg total) by mouth 2 (two) times daily for 14 days. 28 tablet Vick Filter, Britta Mccreedy, MD   omeprazole (PRILOSEC) 20 MG capsule Take 1 capsule (20 mg total) by mouth 2 (two) times daily before a meal. 60 capsule Jnaya Butrick, Britta Mccreedy, MD   amoxicillin (AMOXIL) 500 MG capsule Take 2 capsules (1,000 mg total) by mouth 2 (two) times daily for 14 days. 56 capsule Selden Noteboom, Britta Mccreedy, MD      PDMP not reviewed this encounter.   Merrilee Jansky, MD 05/05/23 1630

## 2023-05-14 NOTE — Progress Notes (Deleted)
GYNECOLOGY  VISIT   HPI: 42 y.o.   Married  [place]  female   [phone] with No LMP recorded.   here for   cycle control  GYNECOLOGIC HISTORY: No LMP recorded. Contraception:  BTL Menopausal hormone therapy:  n/a Last mammogram:  05/01/22 Breast Density Cat B, BI-RADS CAT 1 neg Last pap smear:   11/04/22 ASCUS: HR HPV neg, 03/24/22 atypical endocervical cells, NOS        OB History     Gravida  2   Para  2   Term  2   Preterm      AB      Living  2      SAB      IAB      Ectopic      Multiple      Live Births  2              Patient Active Problem List   Diagnosis Date Noted   Seasonal allergies 11/06/2022   Viral illness 11/06/2022   Elevated blood pressure reading with diagnosis of hypertension 11/06/2022   Routine general medical examination at a health care facility 11/24/2019   IUD check up 11/02/2015   Prediabetes 11/02/2015   Mitral valve prolapse 12/18/2011    Past Medical History:  Diagnosis Date   Elevated hemoglobin A1c 04/08/2022   level 5.8 wtih PCP   Frequent headaches    HTN (hypertension)    Mitral valve prolapse     Past Surgical History:  Procedure Laterality Date   CESAREAN SECTION     TUBAL LIGATION      Current Outpatient Medications  Medication Sig Dispense Refill   amoxicillin (AMOXIL) 500 MG capsule Take 2 capsules (1,000 mg total) by mouth 2 (two) times daily for 14 days. 56 capsule 0   clarithromycin (BIAXIN) 500 MG tablet Take 1 tablet (500 mg total) by mouth 2 (two) times daily for 14 days. 28 tablet 0   [facility] IRON 325 (65 Fe) MG tablet Take 325 mg by mouth daily.     folic acid (FOLVITE) 400 MCG tablet 1 tablet Orally Once a day     omeprazole (PRILOSEC) 20 MG capsule Take 1 capsule (20 mg total) by mouth 2 (two) times daily before a meal. 60 capsule 1   valsartan (DIOVAN) 80 MG tablet Take 80 mg by mouth daily.     No current facility-administered medications for this visit.     ALLERGIES: Codeine,  Diclofenac, Hydrocodone-acetaminophen, Meloxicam, and Oxycontin [oxycodone hcl]  Family History  Problem Relation Age of Onset   Cancer Father    Breast cancer Sister 55   Cancer Maternal Aunt    Diabetes Maternal Aunt    Cancer Maternal Aunt    Diabetes Maternal Aunt    Diabetes Maternal Uncle    Diabetes Maternal Grandmother    Diabetes Maternal Grandfather    Diabetes Cousin    Diabetes Other    Hypertension Other     Social History   Socioeconomic History   Marital status: Married    Spouse name: Not on file   Number of children: 2   Years of education: Not on file   Highest education level: Not on file  Occupational History    Employer: [employer]    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  Tobacco Use   Smoking status: Never   Smokeless tobacco: Never  Vaping Use   Vaping status: Never Used  Substance and Sexual Activity  Alcohol use: No   Drug use: No   Sexual activity: Yes    Partners: Male    Birth control/protection: None    Comment: BTL  Other Topics Concern   Not on file  Social History Narrative   Not on file   Social Determinants of Health   Financial Resource Strain: Not on file  Food Insecurity: Not on file  Transportation Needs: Not on file  Physical Activity: Not on file  Stress: Not on file  Social Connections: Not on file  Intimate Partner Violence: Not on file    Review of Systems  PHYSICAL EXAMINATION:    There were no vitals taken for this visit.    General appearance: alert, cooperative and appears stated age Head: Normocephalic, without obvious abnormality, atraumatic Neck: no adenopathy, supple, symmetrical, trachea midline and thyroid normal to inspection and palpation Lungs: clear to auscultation bilaterally Breasts: normal appearance, no masses or tenderness, No nipple retraction or dimpling, No nipple discharge or bleeding, No axillary or supraclavicular adenopathy Heart: regular rate and rhythm Abdomen: soft, non-tender, no masses,   no organomegaly Extremities: extremities normal, atraumatic, no cyanosis or edema Skin: Skin color, texture, turgor normal. No rashes or lesions Lymph nodes: Cervical, supraclavicular, and axillary nodes normal. No abnormal inguinal nodes palpated Neurologic: Grossly normal  Pelvic: External genitalia:  no lesions              Urethra:  normal appearing urethra with no masses, tenderness or lesions              Bartholins and Skenes: normal                 Vagina: normal appearing vagina with normal color and discharge, no lesions              Cervix: no lesions                Bimanual Exam:  Uterus:  normal size, contour, position, consistency, mobility, non-tender              Adnexa: no mass, fullness, tenderness              Rectal exam: {yes no:314532}.  Confirms.              Anus:  normal sphincter tone, no lesions  Chaperone was present for exam:  ***  ASSESSMENT     PLAN     An After Visit Summary was printed and given to the patient.  ______ minutes face to face time of which over 50% was spent in counseling.

## 2023-05-28 ENCOUNTER — Ambulatory Visit: Payer: Commercial Managed Care - PPO | Admitting: Obstetrics and Gynecology

## 2023-05-28 ENCOUNTER — Other Ambulatory Visit: Payer: Self-pay | Admitting: Obstetrics and Gynecology

## 2023-05-28 DIAGNOSIS — N921 Excessive and frequent menstruation with irregular cycle: Secondary | ICD-10-CM

## 2023-05-28 NOTE — Telephone Encounter (Signed)
Med refill request: norethindrone 0.35 mg  RX d/c'd by another provider. Last office visit 11/04/22 (repeat pap and colpo) Last AEX: none Next AEX: 09/03/23 Last MMG (if hormonal med) 05/01/22 Refill authorized: norethindrone 0.35 mg #84.  Sent to provider for review.

## 2023-06-06 ENCOUNTER — Encounter (HOSPITAL_COMMUNITY): Payer: Self-pay

## 2023-06-06 ENCOUNTER — Ambulatory Visit (HOSPITAL_COMMUNITY)
Admission: EM | Admit: 2023-06-06 | Discharge: 2023-06-06 | Disposition: A | Discharge status: Home / Self Care | Payer: Commercial Managed Care - PPO | Attending: Internal Medicine | Admitting: Internal Medicine

## 2023-06-06 DIAGNOSIS — R350 Frequency of micturition: Secondary | ICD-10-CM

## 2023-06-06 DIAGNOSIS — N3000 Acute cystitis without hematuria: Secondary | ICD-10-CM

## 2023-06-06 LAB — POCT URINALYSIS DIP (MANUAL ENTRY)
Bilirubin, UA: NEGATIVE
Blood, UA: NEGATIVE
Glucose, UA: NEGATIVE mg/dL
Ketones, POC UA: NEGATIVE mg/dL
Leukocytes, UA: NEGATIVE
Nitrite, UA: NEGATIVE
Protein Ur, POC: NEGATIVE mg/dL
Spec Grav, UA: 1.02 (ref 1.010–1.025)
Urobilinogen, UA: 0.2 U/dL
pH, UA: 7 (ref 5.0–8.0)

## 2023-06-06 NOTE — ED Triage Notes (Addendum)
Pt presents with urinary frequency x 3 days. Pt denies burning with urination. Pt reports taking Azo tablets maximum strength, drinking plenty of water & cranberry juice with little improvement.

## 2023-06-06 NOTE — Discharge Instructions (Addendum)
Urinalysis negative for UTI. Symptoms may be due to inflammation in the bladder or UTI is already resolved. For now, continue water and cranberry juice and avoid irritating things like caffeine/sodas for another 1-2 days. Return to urgent care or PCP if symptoms worsen or fail to resolve.

## 2023-06-06 NOTE — ED Provider Notes (Signed)
[facility]    CSN: [id number] Arrival date & time: 06/06/23  1005      History   Chief Complaint Chief Complaint  Patient presents with   Urinary Frequency    HPI Diane Wilson is a 42 y.o. female.   42 yr old female who presents to urgent care with 3 days of frequency with urination. Started taking th OTC azo pills at first signs of symptoms and drinking cranberry juice. Denies fevers, chills, hematuria, dysuria. Staying hydrated. Denies abd/pelvic pain.    Urinary Frequency Pertinent negatives include no chest pain, no abdominal pain and no shortness of breath.    Past Medical History:  Diagnosis Date   Elevated hemoglobin A1c 04/08/2022   level 5.8 wtih PCP   Frequent headaches    HTN (hypertension)    Mitral valve prolapse     Patient Active Problem List   Diagnosis Date Noted   Seasonal allergies 11/06/2022   Viral illness 11/06/2022   Elevated blood pressure reading with diagnosis of hypertension 11/06/2022   Routine general medical examination at a health care facility 11/24/2019   IUD check up 11/02/2015   Prediabetes 11/02/2015   Mitral valve prolapse 12/18/2011    Past Surgical History:  Procedure Laterality Date   CESAREAN SECTION     TUBAL LIGATION      OB History     Gravida  2   Para  2   Term  2   Preterm      AB      Living  2      SAB      IAB      Ectopic      Multiple      Live Births  2            Home Medications    Prior to Admission medications   Medication Sig Start Date End Date Taking? Authorizing Provider  [facility] IRON 325 (65 Fe) MG tablet Take 325 mg by mouth daily. 02/18/22   [provider]  folic acid (FOLVITE) 400 MCG tablet 1 tablet Orally Once a day    [provider]  omeprazole (PRILOSEC) 20 MG capsule Take 1 capsule (20 mg total) by mouth 2 (two) times daily before a meal. 05/04/23 06/03/23  Lamptey, Britta Mccreedy, MD  valsartan (DIOVAN) 80 MG tablet Take 80 mg by mouth  daily.    [provider]    Family History Family History  Problem Relation Age of Onset   Cancer Father    Breast cancer Sister 35   Cancer Maternal Aunt    Diabetes Maternal Aunt    Cancer Maternal Aunt    Diabetes Maternal Aunt    Diabetes Maternal Uncle    Diabetes Maternal Grandmother    Diabetes Maternal Grandfather    Diabetes Cousin    Diabetes Other    Hypertension Other     Social History Social History   Tobacco Use   Smoking status: Never   Smokeless tobacco: Never  Vaping Use   Vaping status: Never Used  Substance Use Topics   Alcohol use: No   Drug use: No     Allergies   Codeine, Diclofenac, Hydrocodone-acetaminophen, Meloxicam, and Oxycontin [oxycodone hcl]   Review of Systems Review of Systems  Constitutional:  Negative for chills and fever.  HENT:  Negative for ear pain and sore throat.   Eyes:  Negative for pain and visual disturbance.  Respiratory:  Negative for cough  and shortness of breath.   Cardiovascular:  Negative for chest pain and palpitations.  Gastrointestinal:  Negative for abdominal pain and vomiting.  Genitourinary:  Positive for frequency. Negative for difficulty urinating, dysuria, flank pain, hematuria and pelvic pain.  Musculoskeletal:  Negative for arthralgias and back pain.  Skin:  Negative for color change and rash.  Neurological:  Negative for seizures and syncope.  All other systems reviewed and are negative.    Physical Exam Triage Vital Signs ED Triage Vitals  Encounter Vitals Group     BP 06/06/23 1026 124/78     Systolic BP Percentile --      Diastolic BP Percentile --      Pulse Rate 06/06/23 1026 88     Resp 06/06/23 1026 16     Temp 06/06/23 1026 98.6 F (37 C)     Temp Source 06/06/23 1026 Oral     SpO2 06/06/23 1026 98 %     Weight --      Height --      Head Circumference --      Peak Flow --      Pain Score 06/06/23 1029 0     Pain Loc --      Pain Education --      Exclude from  Growth Chart --    No data found.  Updated Vital Signs BP 124/78 (BP Location: Right Arm)   Pulse 88   Temp 98.6 F (37 C) (Oral)   Resp 16   SpO2 98%   Visual Acuity Right Eye Distance:   Left Eye Distance:   Bilateral Distance:    Right Eye Near:   Left Eye Near:    Bilateral Near:     Physical Exam Vitals and nursing note reviewed.  Constitutional:      General: She is not in acute distress.    Appearance: She is well-developed.  HENT:     Head: Normocephalic and atraumatic.  Eyes:     Conjunctiva/sclera: Conjunctivae normal.  Cardiovascular:     Rate and Rhythm: Normal rate and regular rhythm.     Heart sounds: No murmur heard. Pulmonary:     Effort: Pulmonary effort is normal. No respiratory distress.     Breath sounds: Normal breath sounds.  Abdominal:     General: Bowel sounds are normal. There is no distension.     Palpations: Abdomen is soft.     Tenderness: There is no abdominal tenderness.  Musculoskeletal:        General: No swelling.     Cervical back: Neck supple.  Skin:    General: Skin is warm and dry.     Capillary Refill: Capillary refill takes less than 2 seconds.  Neurological:     Mental Status: She is alert.  Psychiatric:        Mood and Affect: Mood normal.      UC Treatments / Results  Labs (all labs ordered are listed, but only abnormal results are displayed) Labs Reviewed - No data to display  EKG   Radiology No results found.  Procedures Procedures (including critical care time)  Medications Ordered in UC Medications - No data to display  Initial Impression / Assessment and Plan / UC Course  I have reviewed the triage vital signs and the nursing notes.  Pertinent labs & imaging results that were available during my care of the patient were reviewed by me and considered in my medical decision making (see chart for details).  Urinary frequency  Acute cystitis without hematuria  Urinalysis negative for UTI.  Symptoms may be due to inflammation in the bladder or UTI is already resolved. For now, continue water and cranberry juice and avoid irritating things like caffeine/sodas for another 1-2 days. Return to urgent care or PCP if symptoms worsen or fail to resolve.   Final Clinical Impressions(s) / UC Diagnoses   Final diagnoses:  None   Discharge Instructions   None    ED Prescriptions   None    PDMP not reviewed this encounter.   Landis Martins, [place] 06/06/23 1056

## 2023-06-08 ENCOUNTER — Telehealth (HOSPITAL_COMMUNITY): Payer: Self-pay | Admitting: Emergency Medicine

## 2023-06-10 ENCOUNTER — Ambulatory Visit (HOSPITAL_COMMUNITY)
Admission: EM | Admit: 2023-06-10 | Discharge: 2023-06-10 | Disposition: A | Discharge status: Home / Self Care | Payer: Commercial Managed Care - PPO | Attending: Family Medicine | Admitting: Family Medicine

## 2023-06-10 ENCOUNTER — Encounter (HOSPITAL_COMMUNITY): Payer: Self-pay

## 2023-06-10 DIAGNOSIS — R350 Frequency of micturition: Secondary | ICD-10-CM

## 2023-06-10 DIAGNOSIS — N3090 Cystitis, unspecified without hematuria: Secondary | ICD-10-CM

## 2023-06-10 LAB — POCT URINALYSIS DIP (MANUAL ENTRY)
Blood, UA: NEGATIVE
Glucose, UA: 250 mg/dL — AB
Nitrite, UA: POSITIVE — AB
Protein Ur, POC: 100 mg/dL — AB
Spec Grav, UA: 1.015 (ref 1.010–1.025)
Urobilinogen, UA: >=8 U/dL — AB
pH, UA: 5 (ref 5.0–8.0)

## 2023-06-10 MED ORDER — CEPHALEXIN 500 MG PO CAPS: 500.0000 mg | ORAL_CAPSULE | Freq: Two times a day (BID) | ORAL | 0 refills | Status: DC

## 2023-06-10 NOTE — Progress Notes (Deleted)
42 y.o. G54P2002 Married [place] American female here for annual exam.    PCP: Dow Adolph, FNP   No LMP recorded (lmp unknown).           Sexually active: Yes.    The current method of family planning is tubal ligation.    Menopausal hormone therapy:  n/a Exercising: {yes no:314532}  {types:19826} Smoker:  no  OB History  Gravida Para Term Preterm AB Living  2 2 2     2   SAB IAB Ectopic Multiple Live Births          2    # Outcome Date GA Lbr Len/2nd Weight Sex Type Anes PTL Lv  2 Term 03/26/03 [redacted]w[redacted]d  7 lb 8 oz (3.402 kg) M CS-LTranv EPI  LIV  1 Term 10/17/01 [redacted]w[redacted]d  8 lb 5 oz (3.771 kg) M CS-LTranv EPI  LIV     HEALTH MAINTENANCE:    Component Value Date/Time   DIAGPAP (A) 11/04/2022 0945    - Atypical squamous cells of undetermined significance (ASC-US)   DIAGPAP - Atypical endocervical cells, NOS (A) 03/24/2022 1047   DIAGPAP (A) 11/17/2019 1104    - Atypical squamous cells of undetermined significance (ASC-US)   HPVHIGH Negative 11/04/2022 0945   HPVHIGH Negative 03/24/2022 1047   HPVHIGH Negative 11/17/2019 1104   ADEQPAP  11/04/2022 0945    Satisfactory for evaluation; transformation zone component PRESENT.   ADEQPAP  03/24/2022 1047    Satisfactory for evaluation; transformation zone component PRESENT.   ADEQPAP  11/17/2019 1104    Satisfactory for evaluation; transformation zone component PRESENT.    History of abnormal Pap or positive HPV:  {YES NO:22349} Mammogram: 05/01/22 Breast Density Cat B, BI-RADS CAT 1 neg Colonoscopy:  n/a Bone Density:  n/a  Result  n/a   Immunization History  Administered Date(s) Administered   Influenza,inj,Quad PF,6+ Mos 03/29/2015   PFIZER(Purple Top)SARS-COV-2 Vaccination 10/20/2019, 11/14/2019   Tdap 07/29/2011      reports that she has never smoked. She has never used smokeless tobacco. She reports that she does not drink alcohol and does not use drugs.  Past Medical History:  Diagnosis Date   Elevated  hemoglobin A1c 04/08/2022   level 5.8 wtih PCP   Frequent headaches    HTN (hypertension)    Mitral valve prolapse     Past Surgical History:  Procedure Laterality Date   CESAREAN SECTION     TUBAL LIGATION      Current Outpatient Medications  Medication Sig Dispense Refill   [facility]S IRON 325 (65 Fe) MG tablet Take 325 mg by mouth daily.     folic acid (FOLVITE) 400 MCG tablet 1 tablet Orally Once a day     omeprazole (PRILOSEC) 20 MG capsule Take 1 capsule (20 mg total) by mouth 2 (two) times daily before a meal. 60 capsule 1   valsartan (DIOVAN) 80 MG tablet Take 80 mg by mouth daily.     No current facility-administered medications for this visit.    ALLERGIES: Codeine, Diclofenac, Hydrocodone-acetaminophen, Meloxicam, and Oxycontin [oxycodone hcl]  Family History  Problem Relation Age of Onset   Cancer Father    Breast cancer Sister 46   Cancer Maternal Aunt    Diabetes Maternal Aunt    Cancer Maternal Aunt    Diabetes Maternal Aunt    Diabetes Maternal Uncle    Diabetes Maternal Grandmother    Diabetes Maternal Grandfather    Diabetes Cousin    Diabetes Other  Hypertension Other     Review of Systems  PHYSICAL EXAM:  LMP  (LMP Unknown) Comment: pt states "I have been bleeding since August, I stopped bleeding about a week or two ago."    General appearance: alert, cooperative and appears stated age Head: normocephalic, without obvious abnormality, atraumatic Neck: no adenopathy, supple, symmetrical, trachea midline and thyroid normal to inspection and palpation Lungs: clear to auscultation bilaterally Breasts: normal appearance, no masses or tenderness, No nipple retraction or dimpling, No nipple discharge or bleeding, No axillary adenopathy Heart: regular rate and rhythm Abdomen: soft, non-tender; no masses, no organomegaly Extremities: extremities normal, atraumatic, no cyanosis or edema Skin: skin color, texture, turgor normal. No rashes or lesions Lymph  nodes: cervical, supraclavicular, and axillary nodes normal. Neurologic: grossly normal  Pelvic: External genitalia:  no lesions              No abnormal inguinal nodes palpated.              Urethra:  normal appearing urethra with no masses, tenderness or lesions              Bartholins and Skenes: normal                 Vagina: normal appearing vagina with normal color and discharge, no lesions              Cervix: no lesions              Pap taken: {yes no:314532} Bimanual Exam:  Uterus:  normal size, contour, position, consistency, mobility, non-tender              Adnexa: no mass, fullness, tenderness              Rectal exam: {yes no:314532}.  Confirms.              Anus:  normal sphincter tone, no lesions  Chaperone was present for exam:  {BSCHAPERONE:31226::"Charmayne Odell F, CMA"}  {LABS (Optional):23779}  ASSESSMENT: Well woman visit with gynecologic exam  ***  PLAN: Mammogram screening discussed. Self breast awareness reviewed. Pap and HRV collected:  {yes no:314532} Guidelines for Calcium, Vitamin D, regular exercise program including cardiovascular and weight bearing exercise. Medication refills:  *** Follow up:  ***    Additional counseling given.  {yes [id number]. ***  total time was spent for this patient encounter, including preparation, face-to-face counseling with the patient, coordination of care, and documentation of the encounter in addition to doing the well woman visit with gynecologic exam.   An After Visit Summary was provided to the patient.

## 2023-06-10 NOTE — ED Triage Notes (Signed)
Pt presents with ongoing urinary frequency and burning with urination since being seen on 11/9. Per pt description, urinalysis was negative for UTI, antibiotics were not prescribed however pt reports she has tried pushing fluids, drinking cranberry juice, and taking OTC azo tablets. Pt states "once I finish the azo tablets, the symptoms begin again. I have bought three boxes of the azo tablets since Saturday."

## 2023-06-10 NOTE — Discharge Instructions (Signed)
You have had labs (urine culture) sent today. We will call you with any significant abnormalities or if there is need to begin or change treatment or pursue further follow up.  You may also review your test results online through MyChart. If you do not have a MyChart account, instructions to sign up should be on your discharge paperwork.  

## 2023-06-10 NOTE — Telephone Encounter (Signed)
Spoke with patient, advised per Dr. Edward Jolly. Patient states she was rescheduled for the next available AEX in 08/2023, is agreeable to earlier date if available.   AEX r/s for 06/24/23 at 1400. Advised patient she will need to keep AEX for future refills. Patient verbalizes understanding and is agreeable.

## 2023-06-13 LAB — URINE CULTURE: Culture: >=100000 — AB

## 2023-06-13 NOTE — ED Provider Notes (Signed)
[facility]    ASSESSMENT & PLAN:  1. Urinary frequency   2. Cystitis     Meds ordered this encounter  Medications   cephALEXin (KEFLEX) 500 MG capsule    Sig: Take 1 capsule (500 mg total) by mouth 2 (two) times daily.    Dispense:  10 capsule    Refill:  0   Results for orders placed or performed during the hospital encounter of 06/10/23  Urine Culture   Specimen: Urine, Clean Catch  Result Value Ref Range   Specimen Description URINE, CLEAN CATCH    Special Requests      NONE Performed at [facility], [address], [place], [place] [address]    Culture >=100,000 COLONIES/mL ESCHERICHIA COLI (A)    Report Status 06/13/2023 FINAL    Organism ID, Bacteria ESCHERICHIA COLI (A)       Susceptibility   Escherichia coli - MIC*    AMPICILLIN >=32 RESISTANT Resistant     CEFAZOLIN <=4 SENSITIVE Sensitive     CEFEPIME <=0.12 SENSITIVE Sensitive     CEFTRIAXONE <=0.25 SENSITIVE Sensitive     CIPROFLOXACIN <=0.25 SENSITIVE Sensitive     GENTAMICIN <=1 SENSITIVE Sensitive     IMIPENEM <=0.25 SENSITIVE Sensitive     NITROFURANTOIN <=16 SENSITIVE Sensitive     TRIMETH/SULFA >=320 RESISTANT Resistant     AMPICILLIN/SULBACTAM 16 INTERMEDIATE Intermediate     PIP/TAZO <=4 SENSITIVE Sensitive ug/mL    * >=100,000 COLONIES/mL ESCHERICHIA COLI  POC urinalysis dipstick  Result Value Ref Range   Color, UA red (A) yellow   Clarity, UA cloudy (A) clear   Glucose, UA =250 (A) negative mg/dL   Bilirubin, UA small (A) negative   Ketones, POC UA small (15) (A) negative mg/dL   Spec Grav, UA 2.130 8.657 - 1.025   Blood, UA negative negative   pH, UA 5.0 5.0 - 8.0   Protein Ur, POC =100 (A) negative mg/dL   Urobilinogen, UA >=8.4 (A) 0.2 or 1.0 E.U./dL   Nitrite, UA Positive (A) Negative   Leukocytes, UA Large (3+) (A) Negative    No signs of pyelonephritis. Urine culture sent. Will notify patient of any significant results. Ensure proper hydration. Will follow  up with her PCP or here if not showing improvement over the next 48 hours, sooner if needed.  Outlined signs and symptoms indicating need for more acute intervention. Patient verbalized understanding. After Visit Summary given.  SUBJECTIVE:  Diane Wilson is a 42 y.o. female who presents with ongoing urinary frequency and burning with urination since being seen on 11/9. Per pt description, urinalysis was negative for UTI, antibiotics were not prescribed however pt reports she has tried pushing fluids, drinking cranberry juice, and taking OTC azo tablets. Pt states "once I finish the azo tablets, the symptoms begin again. I have bought three boxes of the azo tablets since Saturday."  LMP: No LMP recorded (lmp unknown).  OBJECTIVE:  Vitals:   06/10/23 1930  BP: 119/87  Pulse: 85  Resp: 18  Temp: 98.1 F (36.7 C)  TempSrc: Oral  SpO2: 94%   General appearance: alert; no distress HENT: oropharynx: moist Lungs: unlabored respirations Abdomen: soft, non-tender; bowel sounds normal; no masses or organomegaly; no guarding or rebound tenderness Back: no CVA tenderness Extremities: no edema; symmetrical with no gross deformities Skin: warm and dry Neurologic: normal gait Psychological: alert and cooperative; normal mood and affect  Labs Reviewed  URINE CULTURE - Abnormal; Notable for the following  components:      Result Value   Culture >=100,000 COLONIES/mL ESCHERICHIA COLI (*)    Organism ID, Bacteria ESCHERICHIA COLI (*)    All other components within normal limits  POCT URINALYSIS DIP (MANUAL ENTRY) - Abnormal; Notable for the following components:   Color, UA red (*)    Clarity, UA cloudy (*)    Glucose, UA =250 (*)    Bilirubin, UA small (*)    Ketones, POC UA small (15) (*)    Protein Ur, POC =100 (*)    Urobilinogen, UA >=8.0 (*)    Nitrite, UA Positive (*)    Leukocytes, UA Large (3+) (*)    All other components within normal limits    Allergies  Allergen  Reactions   Codeine Nausea And Vomiting   Diclofenac Swelling   Hydrocodone-Acetaminophen Nausea And Vomiting   Meloxicam Swelling   Oxycontin [Oxycodone Hcl] Nausea And Vomiting    Past Medical History:  Diagnosis Date   Elevated hemoglobin A1c 04/08/2022   level 5.8 wtih PCP   Frequent headaches    HTN (hypertension)    Mitral valve prolapse    Social History   Socioeconomic History   Marital status: Married    Spouse name: Not on file   Number of children: 2   Years of education: Not on file   Highest education level: Not on file  Occupational History    Employer: [employer]    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  Tobacco Use   Smoking status: Never   Smokeless tobacco: Never  Vaping Use   Vaping status: Never Used  Substance and Sexual Activity   Alcohol use: No   Drug use: No   Sexual activity: Yes    Partners: Male    Birth control/protection: None    Comment: BTL  Other Topics Concern   Not on file  Social History Narrative   Not on file   Social Determinants of Health   Financial Resource Strain: Not on file  Food Insecurity: Not on file  Transportation Needs: Not on file  Physical Activity: Not on file  Stress: Not on file  Social Connections: Not on file  Intimate Partner Violence: Not on file   Family History  Problem Relation Age of Onset   Cancer Father    Breast cancer Sister 43   Cancer Maternal Aunt    Diabetes Maternal Aunt    Cancer Maternal Aunt    Diabetes Maternal Aunt    Diabetes Maternal Uncle    Diabetes Maternal Grandmother    Diabetes Maternal Grandfather    Diabetes Cousin    Diabetes Other    Hypertension Other         Mardella Layman, MD 06/13/23 1018

## 2023-06-23 ENCOUNTER — Ambulatory Visit (HOSPITAL_COMMUNITY)
Admission: EM | Admit: 2023-06-23 | Discharge: 2023-06-23 | Disposition: A | Discharge status: Home / Self Care | Payer: Commercial Managed Care - PPO | Attending: Family Medicine | Admitting: Family Medicine

## 2023-06-23 ENCOUNTER — Encounter (HOSPITAL_COMMUNITY): Payer: Self-pay | Admitting: Emergency Medicine

## 2023-06-23 ENCOUNTER — Other Ambulatory Visit: Payer: Self-pay

## 2023-06-23 DIAGNOSIS — M62838 Other muscle spasm: Secondary | ICD-10-CM

## 2023-06-23 DIAGNOSIS — M436 Torticollis: Secondary | ICD-10-CM | POA: Diagnosis not present

## 2023-06-23 MED ORDER — DIPHENHYDRAMINE HCL 50 MG/ML IJ SOLN
INTRAMUSCULAR | Status: AC
  Filled 2023-06-23: qty 1

## 2023-06-23 MED ORDER — DIAZEPAM 5 MG PO TABS: 5.0000 mg | ORAL_TABLET | Freq: Every evening | ORAL | 0 refills | Status: DC | PRN

## 2023-06-23 MED ORDER — DIPHENHYDRAMINE HCL 50 MG/ML IJ SOLN
50.0000 mg | Freq: Once | INTRAMUSCULAR | Status: AC
  Administered 2023-06-23: 50 mg via INTRAMUSCULAR

## 2023-06-23 NOTE — ED Triage Notes (Signed)
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

## 2023-06-23 NOTE — ED Notes (Signed)
Stressed the likelihood of benadryl making patient sleepy and encouraged precautions.  Husband has agreed he is driving.  Again, stressed the likelihood of prescription medicine making patient sleepy/sedation and to use caution

## 2023-06-23 NOTE — Discharge Instructions (Addendum)
Meds ordered this encounter  Medications   diphenhydrAMINE (BENADRYL) injection 50 mg   diazepam (VALIUM) 5 MG tablet    Sig: Take 1 tablet (5 mg total) by mouth at bedtime as needed for muscle spasms.    Dispense:  5 tablet    Refill:  0

## 2023-06-23 NOTE — ED Triage Notes (Signed)
Patient woke with right side of neck hurting.  This started 2 weeks ago.  No known injury, no fall.  Have taken tylenol arthritis and ibuprofen.

## 2023-06-24 ENCOUNTER — Ambulatory Visit: Payer: Commercial Managed Care - PPO | Admitting: Obstetrics and Gynecology

## 2023-06-24 NOTE — ED Provider Notes (Signed)
[facility]   [id number] 06/23/23 Arrival Time: 1652  ASSESSMENT & PLAN:  1. Muscle spasms of neck    Unclear cause. No signs of infection.  Meds ordered this encounter  Medications   diphenhydrAMINE (BENADRYL) injection 50 mg   diazepam (VALIUM) 5 MG tablet    Sig: Take 1 tablet (5 mg total) by mouth at bedtime as needed for muscle spasms.    Dispense:  5 tablet    Refill:  0   Med sedation precautions.  Recommend:  Follow-up Information     Dow Adolph, FNP.   Specialty: Nurse Practitioner Why: As needed. Contact information: [address] [place] [facility] [address] [phone]         [facility].   Specialty: Urgent Care Why: If worsening or failing to improve as anticipated. Contact information: [address] [place] [address] [phone]                [place] Controlled Substances Registry consulted for this patient. I feel the risk/benefit ratio today is favorable for proceeding with this prescription for a controlled substance. Medication sedation precautions given.  Reviewed expectations re: course of current medical issues. Questions answered. Outlined signs and symptoms indicating need for more acute intervention. Patient verbalized understanding. After Visit Summary given.  SUBJECTIVE: History from: patient. Diane Wilson is a 42 y.o. female who reports R neck pain; x 2 weeks; hurts to move head; Tylenol/ibup with minimal help. Denies neck trauma. Afebrile. No recent illnesses. No new medications.  Past Surgical History:  Procedure Laterality Date   CESAREAN SECTION     TUBAL LIGATION       OBJECTIVE:  Vitals:   06/23/23 1731  BP: 126/88  Pulse: 99  Resp: 18  Temp: 98.4 F (36.9 C)  TempSrc: Oral  SpO2: 97%    General appearance: alert; no distress HEENT: [place]; AT; PERRLA Neck: supple R sided neck soreness extending into R trapezius distribution Resp: unlabored  respirations Skin: warm and dry; no visible rashes Neurologic: gait normal; normal sensation and strength of bilateral UE Psychological: alert and cooperative; normal mood and affect    Allergies  Allergen Reactions   Codeine Nausea And Vomiting   Diclofenac Swelling   Hydrocodone-Acetaminophen Nausea And Vomiting   Meloxicam Swelling   Oxycontin [Oxycodone Hcl] Nausea And Vomiting    Past Medical History:  Diagnosis Date   Elevated hemoglobin A1c 04/08/2022   level 5.8 wtih PCP   Frequent headaches    HTN (hypertension)    Mitral valve prolapse    Social History   Socioeconomic History   Marital status: Married    Spouse name: Not on file   Number of children: 2   Years of education: Not on file   Highest education level: Not on file  Occupational History    Employer: [employer]    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  Tobacco Use   Smoking status: Never   Smokeless tobacco: Never  Vaping Use   Vaping status: Never Used  Substance and Sexual Activity   Alcohol use: No   Drug use: No   Sexual activity: Yes    Partners: Male    Birth control/protection: None    Comment: BTL  Other Topics Concern   Not on file  Social History Narrative   Not on file   Social Determinants of Health   Financial Resource Strain: Not on file  Food Insecurity: Not on file  Transportation Needs:  Not on file  Physical Activity: Not on file  Stress: Not on file  Social Connections: Not on file   Family History  Problem Relation Age of Onset   Cancer Father    Breast cancer Sister 72   Cancer Maternal Aunt    Diabetes Maternal Aunt    Cancer Maternal Aunt    Diabetes Maternal Aunt    Diabetes Maternal Uncle    Diabetes Maternal Grandmother    Diabetes Maternal Grandfather    Diabetes Cousin    Diabetes Other    Hypertension Other    Past Surgical History:  Procedure Laterality Date   CESAREAN SECTION     TUBAL LIGATION         Mardella Layman, MD 06/24/23 1006

## 2023-06-30 ENCOUNTER — Other Ambulatory Visit: Payer: Self-pay | Admitting: Obstetrics and Gynecology

## 2023-06-30 DIAGNOSIS — N921 Excessive and frequent menstruation with irregular cycle: Secondary | ICD-10-CM

## 2023-06-30 NOTE — Telephone Encounter (Signed)
Medication refill request: micronor Last AEX:  2022 Next AEX: pt no showed appt 06-24-23 Last MMG (if hormonal medication request): 05-01-22 neg Refill authorized: rx was sent for & patient did not come to her appt. Pharmacy asking for supply. Please approve or deny as appropriate. Message sent to front desk to call patient to reschedule her aex.

## 2023-07-07 NOTE — Telephone Encounter (Signed)
Call placed to patient, left detailed message, ok per dpr. Advised per Dr. Edward Jolly. Return call to office at [phone], opt 1 to schedule.  Rx refused. Needs appt.   Encounter closed.

## 2023-07-15 LAB — LAB REPORT - SCANNED
EGFR: 92
TSH: 1.15 (ref 0.41–5.90)

## 2023-08-25 NOTE — Progress Notes (Signed)
43 y.o. G12P2002 Married [place] American female here for annual exam.    States that her POP did not help her bleeding or cramping, so she stopped. Soaking tampon every 1 hour or pad every 1/5 - 2 hours.   Had 2 menstrual cycles in January.  1/2 - 1/8.  1/26- 2/2.  Wants to proceed with hysterectomy.  The bleeding is affecting the quality of her life.   She declines future childbearing.   Working on her college degree.   PCP: Dow Adolph, FNP   Patient's last menstrual period was 08/23/2023.     Period Cycle (Days): 16 Period Duration (Days): 7 Period Pattern: (!) Irregular Menstrual Flow: Heavy Menstrual Control: Tampon Dysmenorrhea: (!) Severe     Sexually active: Yes.    The current method of family planning is tubal ligation.    Menopausal hormone therapy:  n/a Exercising: Yes.     biking Smoker:  no  OB History  Gravida Para Term Preterm AB Living  2 2 2   2   SAB IAB Ectopic Multiple Live Births      2    # Outcome Date GA Lbr Len/2nd Weight Sex Type Anes PTL Lv  2 Term 03/26/03 [redacted]w[redacted]d  7 lb 8 oz (3.402 kg) M CS-LTranv EPI  LIV  1 Term 10/17/01 [redacted]w[redacted]d  8 lb 5 oz (3.771 kg) M CS-LTranv EPI  LIV     HEALTH MAINTENANCE: Last 2 paps:  11/04/22 ASCUS: HR HPV neg, 03/24/22 atypical endocervical cells, NOS History of abnormal Pap or positive HPV:  yes.  Colpo 11/04/22:  LGSIL Mammogram:   05/01/22 Breast Density Cat B, BI-RADS CAT 1 neg Colonoscopy:  n/a Bone Density:  n/a  Result  n/a   Immunization History  Administered Date(s) Administered   Influenza,inj,Quad PF,6+ Mos 03/29/2015   PFIZER(Purple Top)SARS-COV-2 Vaccination 10/20/2019, 11/14/2019   Tdap 07/29/2011      reports that she has never smoked. She has never used smokeless tobacco. She reports that she does not drink alcohol and does not use drugs.  Past Medical History:  Diagnosis Date   Elevated hemoglobin A1c 04/08/2022   level 5.8 wtih PCP   Frequent headaches    HTN (hypertension)     Mitral valve prolapse     Past Surgical History:  Procedure Laterality Date   CESAREAN SECTION     x 2   TUBAL LIGATION      Current Outpatient Medications  Medication Sig Dispense Refill   Cholecalciferol 50 MCG (2000 UT) TABS 1 tablet Orally Once a day for 90 days     [employer] IRON 325 (65 Fe) MG tablet Take 325 mg by mouth daily.     folic acid (FOLVITE) 400 MCG tablet 1 tablet Orally Once a day     tirzepatide (MOUNJARO) 5 MG/0.5ML Pen Inject 5 mg into the skin once a week.     valsartan (DIOVAN) 80 MG tablet Take 80 mg by mouth daily.     No current facility-administered medications for this visit.    ALLERGIES: Codeine, Diclofenac, Hydrocodone-acetaminophen, Meloxicam, and Oxycontin [oxycodone hcl]  Family History  Problem Relation Age of Onset   Cirrhosis Mother    Cancer Father        prostate   Breast cancer Sister 73   Cancer Maternal Aunt 66       lung   Diabetes Maternal Aunt    Cancer Maternal Aunt 34       ovarian   Diabetes Maternal Aunt  Diabetes Maternal Uncle    Diabetes Maternal Grandmother    Diabetes Maternal Grandfather    Diabetes Cousin    Diabetes Other    Hypertension Other     Review of Systems  All other systems reviewed and are negative.   PHYSICAL EXAM:  BP 134/80 (BP Location: Left Arm, Patient Position: Sitting, Cuff Size: Small)   Pulse 96   Ht 5' 4.25" (1.632 m)   Wt 189 lb (85.7 kg)   LMP 08/23/2023   SpO2 98%   BMI 32.19 kg/m     General appearance: alert, cooperative and appears stated age Head: normocephalic, without obvious abnormality, atraumatic Neck: no adenopathy, supple, symmetrical, trachea midline and thyroid normal to inspection and palpation Lungs: clear to auscultation bilaterally Breasts: normal appearance, no masses or tenderness, No nipple retraction or dimpling, No nipple discharge or bleeding, No axillary adenopathy Heart: regular rate and rhythm Abdomen: soft, non-tender; no masses, no  organomegaly Extremities: extremities normal, atraumatic, no cyanosis or edema Skin: skin color, texture, turgor normal. No rashes or lesions Lymph nodes: cervical, supraclavicular, and axillary nodes normal. Neurologic: grossly normal  Pelvic: External genitalia:  no lesions              No abnormal inguinal nodes palpated.              Urethra:  normal appearing urethra with no masses, tenderness or lesions              Bartholins and Skenes: normal                 Vagina: normal appearing vagina with normal color and discharge, no lesions              Cervix: no lesions              Pap taken: Yes.   Bimanual Exam:  Uterus:  normal size, contour, position, consistency, mobility, non-tender              Adnexa: no mass, fullness, tenderness              Rectal exam: Yes.  .  Confirms.              Anus:  normal sphincter tone, no lesions  Chaperone was present for exam:  Warren Lacy, CMA  ASSESSMENT: Well woman visit with gynecologic exam Status post BTL.  Dysmenorrhea.  Hx simple left ovarian cyst.  Cervical cancer screening.  Pap with atypical endocervical cells.  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  Menorrhagia with irregular menses.  Dysmenorrhea. Hx C/S x 2.  FH breast, prostate, ovarian and lung cancer.  HTN.  PHQ2:  0  PLAN: Mammogram screening discussed.  She will schedule.  Self breast awareness reviewed. Pap and HRV collected:  Yes.   Guidelines for Calcium, Vitamin D, regular exercise program including cardiovascular and weight bearing exercise. Medication refills:  NA She will send labs from Dr. Chauncy Passy office from December, 2024.  Return for pelvic US and endometrial biopsy.  Referral for genetic counseling and testing.  Follow up also for yearly exam.

## 2023-09-03 ENCOUNTER — Ambulatory Visit: Payer: Commercial Managed Care - PPO | Admitting: Obstetrics and Gynecology

## 2023-09-08 ENCOUNTER — Encounter: Payer: Self-pay | Admitting: Obstetrics and Gynecology

## 2023-09-08 ENCOUNTER — Other Ambulatory Visit (HOSPITAL_COMMUNITY)
Admission: RE | Admit: 2023-09-08 | Discharge: 2023-09-08 | Disposition: A | Discharge status: Home / Self Care | Payer: Commercial Managed Care - PPO | Source: Ambulatory Visit | Attending: Obstetrics and Gynecology | Admitting: Obstetrics and Gynecology

## 2023-09-08 ENCOUNTER — Ambulatory Visit (INDEPENDENT_AMBULATORY_CARE_PROVIDER_SITE_OTHER): Payer: Commercial Managed Care - PPO | Admitting: Obstetrics and Gynecology

## 2023-09-08 VITALS — BP 134/80 | HR 96 | Ht 64.25 in | Wt 189.0 lb

## 2023-09-08 DIAGNOSIS — N946 Dysmenorrhea, unspecified: Secondary | ICD-10-CM

## 2023-09-08 DIAGNOSIS — Z1331 Encounter for screening for depression: Secondary | ICD-10-CM

## 2023-09-08 DIAGNOSIS — Z01419 Encounter for gynecological examination (general) (routine) without abnormal findings: Secondary | ICD-10-CM | POA: Diagnosis not present

## 2023-09-08 DIAGNOSIS — Z124 Encounter for screening for malignant neoplasm of cervix: Secondary | ICD-10-CM | POA: Diagnosis present

## 2023-09-08 DIAGNOSIS — N83202 Unspecified ovarian cyst, left side: Secondary | ICD-10-CM

## 2023-09-08 DIAGNOSIS — N870 Mild cervical dysplasia: Secondary | ICD-10-CM | POA: Diagnosis present

## 2023-09-08 DIAGNOSIS — N921 Excessive and frequent menstruation with irregular cycle: Secondary | ICD-10-CM | POA: Diagnosis not present

## 2023-09-08 DIAGNOSIS — Z809 Family history of malignant neoplasm, unspecified: Secondary | ICD-10-CM

## 2023-09-08 NOTE — Patient Instructions (Signed)

## 2023-09-09 ENCOUNTER — Telehealth: Payer: Self-pay | Admitting: Genetic Counselor

## 2023-09-09 NOTE — Telephone Encounter (Signed)
Left the patient a voicemail to call back.

## 2023-09-10 ENCOUNTER — Telehealth: Payer: Self-pay | Admitting: Genetic Counselor

## 2023-09-10 ENCOUNTER — Encounter: Payer: Self-pay | Admitting: Internal Medicine

## 2023-09-10 NOTE — Telephone Encounter (Signed)
Left a voicemail to call back

## 2023-09-10 NOTE — Telephone Encounter (Signed)
Patient called back to schedule a genetic counseling appointment. Patient is aware of the made appointments.

## 2023-09-11 ENCOUNTER — Telehealth: Payer: Self-pay

## 2023-09-11 NOTE — Telephone Encounter (Signed)
Called patient for Pre Visit.  No answer.  Left message.  Will try back in 5 minutes.

## 2023-09-16 ENCOUNTER — Telehealth: Payer: Self-pay

## 2023-09-16 ENCOUNTER — Ambulatory Visit (AMBULATORY_SURGERY_CENTER): Payer: Commercial Managed Care - PPO

## 2023-09-16 VITALS — Ht 62.5 in | Wt 187.0 lb

## 2023-09-16 DIAGNOSIS — Z1211 Encounter for screening for malignant neoplasm of colon: Secondary | ICD-10-CM

## 2023-09-16 LAB — CYTOLOGY - PAP
Comment: NEGATIVE
Diagnosis: NEGATIVE
High risk HPV: NEGATIVE

## 2023-09-16 MED ORDER — SUFLAVE 178.7 G PO SOLR: 1.0000 | Freq: Once | ORAL | 0 refills | Status: AC

## 2023-09-16 NOTE — Telephone Encounter (Signed)
Called patient for previsit with no answer.  Left message

## 2023-09-16 NOTE — Progress Notes (Signed)

## 2023-09-16 NOTE — Telephone Encounter (Signed)
Called back and went to voicemail.  Left message that we needed a call back to reschedule her pre visit today or her colonoscopy would be cancelled.

## 2023-09-18 ENCOUNTER — Encounter: Payer: Self-pay | Admitting: Obstetrics and Gynecology

## 2023-09-22 NOTE — Progress Notes (Signed)
 GYNECOLOGY  VISIT   HPI: 43 y.o.   Married  [place] female   [phone] with Patient's last menstrual period was 09/14/2023.   here for: pelvic ultrasound and endometrial biopsy.  Patient has heavy and irregular menstrual bleeding and dysmenorrhea affecting the quality of her life.  She had had a tubal ligation, denies future childbearing, and is working toward hysterectomy.   Had previously used Mirena x 2 and had heavy bleeding with the second IUD. Progesterone only pills were also not helpful.  Prior endometrial biopsy 03/24/22 - later proliferative endometrium.  No hyperplasia or malignancy.  Labs from 07/15/23 with Dr. Duanne Guess, patient's PCP, scanned in Epic.  Hgb 11.6 and TSH 1.15.   UPT today:  negative.    GYNECOLOGIC HISTORY: Patient's last menstrual period was 09/14/2023. 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  Patient will call to schedule this.  Colonoscopy last week:  hyperplastic polyp - follow up not defined yet.         OB History     Gravida  2   Para  2   Term  2   Preterm      AB      Living  2      SAB      IAB      Ectopic      Multiple      Live Births  2              Patient Active Problem List   Diagnosis Date Noted   Seasonal allergies 11/06/2022   Viral illness 11/06/2022   Elevated blood pressure reading with diagnosis of hypertension 11/06/2022   Routine general medical examination at a health care facility 11/24/2019   IUD check up 11/02/2015   Prediabetes 11/02/2015   Mitral valve prolapse 12/18/2011    Past Medical History:  Diagnosis Date   Diabetes mellitus without complication (HCC)    Elevated hemoglobin A1c 04/08/2022   level 5.8 wtih PCP   Frequent  headaches    HTN (hypertension)    Mitral valve prolapse     Past Surgical History:  Procedure Laterality Date   CESAREAN SECTION     x 2   TUBAL LIGATION      Current Outpatient Medications  Medication Sig Dispense Refill   Cholecalciferol 50 MCG (2000 UT) TABS      CVS IRON 325 (65 Fe) MG tablet Take 325 mg by mouth daily.     folic acid (FOLVITE) 400 MCG tablet 1 tablet Orally Once a day     tirzepatide (MOUNJARO) 5 MG/0.5ML Pen Inject 5 mg into the skin once a week.     valsartan (DIOVAN) 80 MG tablet Take 80 mg by mouth daily.     No current facility-administered medications for this visit.     ALLERGIES: Diclofenac, Meloxicam, Codeine, Hydrocodone-acetaminophen, and Oxycontin [oxycodone hcl]  Family History  Problem Relation Age of Onset   Cirrhosis Mother    Cancer Father        prostate   Breast cancer Sister 76   Cancer Maternal Aunt 26       lung   Diabetes Maternal Aunt    Cancer Maternal Aunt 11  ovarian   Diabetes Maternal Aunt    Diabetes Maternal Uncle    Diabetes Maternal Grandmother    Diabetes Maternal Grandfather    Diabetes Cousin    Diabetes Other    Hypertension Other    Colon cancer Neg Hx    Colon polyps Neg Hx    Esophageal cancer Neg Hx    Rectal cancer Neg Hx    Stomach cancer Neg Hx     Social History   Socioeconomic History   Marital status: Married    Spouse name: Not on file   Number of children: 2   Years of education: Not on file   Highest education level: Not on file  Occupational History    Employer: [employer]    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  Tobacco Use   Smoking status: Never   Smokeless tobacco: Never  Vaping Use   Vaping status: Never Used  Substance and Sexual Activity   Alcohol use: No   Drug use: No   Sexual activity: Yes    Partners: Male    Birth control/protection: None    Comment: BTL  Other Topics Concern   Not on file  Social History Narrative   Not on file   Social Drivers of Health    Financial Resource Strain: Not on file  Food Insecurity: Not on file  Transportation Needs: Not on file  Physical Activity: Not on file  Stress: Not on file  Social Connections: Not on file  Intimate Partner Violence: Not on file    Review of Systems  All other systems reviewed and are negative.   PHYSICAL EXAMINATION:   BP 130/78 (BP Location: Left Arm, Patient Position: Sitting, Cuff Size: Small)   Pulse 81   Ht 5' 2.5" (1.588 m)   Wt 187 lb (84.8 kg)   LMP 09/14/2023   SpO2 98%   BMI 33.66 kg/m     General appearance: alert, cooperative and appears stated age     Pelvic US  Uterus 9.04 x 5.09 x 4.44 cm.  No myometrial masses.  EMS 6.01 mm.   No masses.  Avascular.  Left ovary 3.37 x 2.44 x 1.84 cm.  Right ovary 3.5 x 2.12 x 1.94 cm.  Right ovarian complex cyst 19 x 16 mm.  No adnexal masses.  No free fluid.   EMB: Consent for procedure. Time out done. Sterile prep with Hibiclens. Paracervical block with 1% lidocaine 10 cc, lot number [id number], exp 03/2025.  Tenaculum to anterior cervical lip. Pipelle passed to almost 8 cm twice.   Tissue to pathology.  Minimal EBL. No complications.   Chaperone was present for exam:  Warren Lacy, CMA  ASSESSMENT:  Menorrhagia with irregular menses.  Dysmenorrhea. Status post BTL.  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  Pap 09/08/23 normal and HR HPV negative  Hx C/S x 2.  FH breast, prostate, ovarian and lung cancer.  Genetic counseling and potential testing in April, 2025.   PLAN:  Fu EMB. Schedule mammogram.  Keep appointment for genetic counseling and testing.  Surgical referral to Dr. Karma Greaser or Dr. Kennith Center.

## 2023-09-25 ENCOUNTER — Encounter: Payer: Self-pay | Admitting: Internal Medicine

## 2023-09-29 ENCOUNTER — Ambulatory Visit: Payer: Commercial Managed Care - PPO | Admitting: Internal Medicine

## 2023-09-29 ENCOUNTER — Encounter: Payer: Self-pay | Admitting: Internal Medicine

## 2023-09-29 VITALS — BP 119/71 | HR 68 | Temp 97.3°F | Resp 12 | Ht 62.5 in | Wt 187.0 lb

## 2023-09-29 DIAGNOSIS — Z1211 Encounter for screening for malignant neoplasm of colon: Secondary | ICD-10-CM | POA: Diagnosis present

## 2023-09-29 DIAGNOSIS — K635 Polyp of colon: Secondary | ICD-10-CM

## 2023-09-29 DIAGNOSIS — D125 Benign neoplasm of sigmoid colon: Secondary | ICD-10-CM

## 2023-09-29 MED ORDER — SODIUM CHLORIDE 0.9 % IV SOLN: 500.0000 mL | Freq: Once | INTRAVENOUS | Status: DC

## 2023-09-29 NOTE — Progress Notes (Signed)
 GASTROENTEROLOGY PROCEDURE H&P NOTE   Primary Care Physician: Dow Adolph, FNP    Reason for Procedure:  Colon cancer screening  Plan:    Colonoscopy  Patient is appropriate for endoscopic procedure(s) in the ambulatory ([facility]) setting.  The nature of the procedure, as well as the risks, benefits, and alternatives were carefully and thoroughly reviewed with the patient. Ample time for discussion and questions allowed. The patient understood, was satisfied, and agreed to proceed.     HPI: Diane Wilson is a 43 y.o. female who presents for screening colonoscopy.  Medical history as below.  Tolerated the prep.  No recent chest pain or shortness of breath.  No abdominal pain today.  Past Medical History:  Diagnosis Date   Diabetes mellitus without complication (HCC)    Elevated hemoglobin A1c 04/08/2022   level 5.8 wtih PCP   Frequent headaches    HTN (hypertension)    Mitral valve prolapse     Past Surgical History:  Procedure Laterality Date   CESAREAN SECTION     x 2   TUBAL LIGATION      Prior to Admission medications   Medication Sig Start Date End Date Taking? Authorizing Provider  Cholecalciferol 50 MCG (2000 UT) TABS 1 tablet Orally Once a day for 90 days Patient not taking: Reported on 09/29/2023    [provider]  [facility] IRON 325 (65 Fe) MG tablet Take 325 mg by mouth daily. 02/18/22   [provider]  folic acid (FOLVITE) 400 MCG tablet 1 tablet Orally Once a day    [provider]  tirzepatide Greggory Keen) 5 MG/0.5ML Pen Inject 5 mg into the skin once a week.    [provider]  valsartan (DIOVAN) 80 MG tablet Take 80 mg by mouth daily.    [provider]    Current Outpatient Medications  Medication Sig Dispense Refill   Cholecalciferol 50 MCG (2000 UT) TABS 1 tablet Orally Once a day for 90 days (Patient not taking: Reported on 09/29/2023)     CVS IRON 325 (65 Fe) MG tablet Take 325 mg by mouth daily.     folic  acid (FOLVITE) 400 MCG tablet 1 tablet Orally Once a day     tirzepatide (MOUNJARO) 5 MG/0.5ML Pen Inject 5 mg into the skin once a week.     valsartan (DIOVAN) 80 MG tablet Take 80 mg by mouth daily.     Current Facility-Administered Medications  Medication Dose Route Frequency Provider Last Rate Last Admin   0.9 %  sodium chloride infusion  500 mL Intravenous Once Colton Tassin, Carie Caddy, MD        Allergies as of 09/29/2023 - Review Complete 09/29/2023  Allergen Reaction Noted   Diclofenac Swelling 03/23/2021   Meloxicam Swelling 07/22/2021   Codeine Nausea And Vomiting 09/13/2017   Hydrocodone-acetaminophen Nausea And Vomiting 12/19/2020   Oxycontin [oxycodone hcl] Nausea And Vomiting 09/13/2017    Family History  Problem Relation Age of Onset   Cirrhosis Mother    Cancer Father        prostate   Breast cancer Sister 34   Cancer Maternal Aunt 50       lung   Diabetes Maternal Aunt    Cancer Maternal Aunt 48       ovarian   Diabetes Maternal Aunt    Diabetes Maternal Uncle    Diabetes Maternal Grandmother    Diabetes Maternal Grandfather    Diabetes Cousin    Diabetes Other  Hypertension Other    Colon cancer Neg Hx    Colon polyps Neg Hx    Esophageal cancer Neg Hx    Rectal cancer Neg Hx    Stomach cancer Neg Hx     Social History   Socioeconomic History   Marital status: Married    Spouse name: Not on file   Number of children: 2   Years of education: Not on file   Highest education level: Not on file  Occupational History    Employer: [employer]    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  Tobacco Use   Smoking status: Never   Smokeless tobacco: Never  Vaping Use   Vaping status: Never Used  Substance and Sexual Activity   Alcohol use: No   Drug use: No   Sexual activity: Yes    Partners: Male    Birth control/protection: None    Comment: BTL  Other Topics Concern   Not on file  Social History Narrative   Not on file   Social Drivers of Health   Financial  Resource Strain: Not on file  Food Insecurity: Not on file  Transportation Needs: Not on file  Physical Activity: Not on file  Stress: Not on file  Social Connections: Not on file  Intimate Partner Violence: Not on file    Physical Exam: Vital signs in last 24 hours: @BP  115/84   Pulse 66   Temp (!) 97.3 F (36.3 C)   Ht 5' 2.5" (1.588 m)   Wt 187 lb (84.8 kg)   LMP 08/23/2023   SpO2 100%   BMI 33.66 kg/m  GEN: NAD EYE: Sclerae anicteric ENT: MMM CV: Non-tachycardic Pulm: CTA b/l GI: Soft, NT/ND NEURO:  Alert & Oriented x 3   Erick Blinks, MD [facility]  09/29/2023 11:18 AM

## 2023-09-29 NOTE — Progress Notes (Signed)
 Report to PACU, RN, vss, BBS= Clear.

## 2023-09-29 NOTE — Progress Notes (Signed)
 Pt's states no medical or surgical changes since previsit or office visit.

## 2023-09-29 NOTE — Op Note (Signed)
 [facility] Patient Name: Diane Wilson Procedure Date: 09/29/2023 11:23 AM MRN: [id number] Endoscopist: Beverley Fiedler , MD, [id number] Age: 43 Referring MD:  Date of Birth: 09-27-1980 Gender: Female Account #: [id number] Procedure:                Colonoscopy Indications:              Screening for colorectal malignant neoplasm, This                            is the patient's first colonoscopy Medicines:                Monitored Anesthesia Care Procedure:                Pre-Anesthesia Assessment:                           - Prior to the procedure, a History and Physical                            was performed, and patient medications and                            allergies were reviewed. The patient's tolerance of                            previous anesthesia was also reviewed. The risks                            and benefits of the procedure and the sedation                            options and risks were discussed with the patient.                            All questions were answered, and informed consent                            was obtained. Prior Anticoagulants: The patient has                            taken no anticoagulant or antiplatelet agents. ASA                            Grade Assessment: II - A patient with mild systemic                            disease. After reviewing the risks and benefits,                            the patient was deemed in satisfactory condition to                            undergo the procedure.  After obtaining informed consent, the colonoscope                            was passed under direct vision. Throughout the                            procedure, the patient's blood pressure, pulse, and                            oxygen saturations were monitored continuously. The                            CF [id number] #[id number] was introduced through the anus                            and advanced to the  terminal ileum. The colonoscopy                            was performed without difficulty. The patient                            tolerated the procedure well. The quality of the                            bowel preparation was good. The terminal ileum,                            ileocecal valve, appendiceal orifice, and rectum                            were photographed. Scope In: 11:28:31 AM Scope Out: 11:41:57 AM Scope Withdrawal Time: 0 hours 10 minutes 35 seconds  Total Procedure Duration: 0 hours 13 minutes 26 seconds  Findings:                 The digital rectal exam was normal.                           The terminal ileum appeared normal.                           A 5 mm polyp was found in the sigmoid colon. The                            polyp was sessile. The polyp was removed with a                            cold snare. Resection and retrieval were complete.                           The exam was otherwise without abnormality on                            direct and retroflexion views. Complications:  No immediate complications. Estimated Blood Loss:     Estimated blood loss: none. Impression:               - The examined portion of the ileum was normal.                           - One 5 mm polyp in the sigmoid colon, removed with                            a cold snare. Resected and retrieved.                           - The examination was otherwise normal on direct                            and retroflexion views. Recommendation:           - Patient has a contact number available for                            emergencies. The signs and symptoms of potential                            delayed complications were discussed with the                            patient. Return to normal activities tomorrow.                            Written discharge instructions were provided to the                            patient.                           - Resume previous  diet.                           - Continue present medications.                           - Await pathology results.                           - Repeat colonoscopy is recommended. The                            colonoscopy date will be determined after pathology                            results from today's exam become available for                            review. Beverley Fiedler, MD 09/29/2023 11:44:52 AM This report has been signed electronically.

## 2023-09-29 NOTE — Patient Instructions (Signed)
 YOU HAD AN ENDOSCOPIC PROCEDURE TODAY: Refer to the procedure report and other information in the discharge instructions given to you for any specific questions about what was found during the examination. If this information does not answer your questions, please call [place] office at [phone] to clarify.   YOU SHOULD EXPECT: Some feelings of bloating in the abdomen. Passage of more gas than usual. Walking can help get rid of the air that was put into your GI tract during the procedure and reduce the bloating. If you had a lower endoscopy (such as a colonoscopy or flexible sigmoidoscopy) you may notice spotting of blood in your stool or on the toilet paper. Some abdominal soreness may be present for a day or two, also.  DIET: Your first meal following the procedure should be a light meal and then it is ok to progress to your normal diet. A half-sandwich or bowl of soup is an example of a good first meal. Heavy or fried foods are harder to digest and may make you feel nauseous or bloated. Drink plenty of fluids but you should avoid alcoholic beverages for 24 hours. If you had a esophageal dilation, please see attached instructions for diet.    ACTIVITY: Your care partner should take you home directly after the procedure. You should plan to take it easy, moving slowly for the rest of the day. You can resume normal activity the day after the procedure however YOU SHOULD NOT DRIVE, use power tools, machinery or perform tasks that involve climbing or major physical exertion for 24 hours (because of the sedation medicines used during the test).   SYMPTOMS TO REPORT IMMEDIATELY: A gastroenterologist can be reached at any hour. Please call [phone]  for any of the following symptoms:  Following lower endoscopy  Excessive amounts of blood in the stool  Significant tenderness, worsening of abdominal pains  Swelling of the abdomen that is new, acute  Fever of 100 or higher  FOLLOW UP:  If any  biopsies were taken you will be contacted by phone or by letter within the next 1-3 weeks. Call [phone]  if you have not heard about the biopsies in 3 weeks.  Please also call with any specific questions about appointments or follow up tests.

## 2023-09-30 ENCOUNTER — Telehealth: Payer: Self-pay

## 2023-09-30 NOTE — Telephone Encounter (Signed)
  Follow up Call-     09/29/2023   10:32 AM  Call back number  Post procedure Call Back phone  # [phone]  Permission to leave phone message Yes     Patient questions:  Do you have a fever, pain , or abdominal swelling? No. Pain Score  0 *  Have you tolerated food without any problems? Yes.    Have you been able to return to your normal activities? Yes.    Do you have any questions about your discharge instructions: Diet   No. Medications  No. Follow up visit  No.  Do you have questions or concerns about your Care? No.  Actions: * If pain score is 4 or above: No action needed, pain <4.

## 2023-10-02 LAB — SURGICAL PATHOLOGY

## 2023-10-06 ENCOUNTER — Other Ambulatory Visit (HOSPITAL_COMMUNITY)
Admission: RE | Admit: 2023-10-06 | Discharge: 2023-10-06 | Disposition: A | Discharge status: Home / Self Care | Source: Ambulatory Visit | Attending: Obstetrics and Gynecology | Admitting: Obstetrics and Gynecology

## 2023-10-06 ENCOUNTER — Encounter: Payer: Self-pay | Admitting: Obstetrics and Gynecology

## 2023-10-06 ENCOUNTER — Ambulatory Visit: Payer: Commercial Managed Care - PPO | Admitting: Obstetrics and Gynecology

## 2023-10-06 ENCOUNTER — Other Ambulatory Visit: Payer: Self-pay | Admitting: Obstetrics and Gynecology

## 2023-10-06 ENCOUNTER — Ambulatory Visit (INDEPENDENT_AMBULATORY_CARE_PROVIDER_SITE_OTHER): Payer: Commercial Managed Care - PPO

## 2023-10-06 VITALS — BP 130/78 | HR 81 | Ht 62.5 in | Wt 187.0 lb

## 2023-10-06 DIAGNOSIS — N946 Dysmenorrhea, unspecified: Secondary | ICD-10-CM

## 2023-10-06 DIAGNOSIS — N83202 Unspecified ovarian cyst, left side: Secondary | ICD-10-CM | POA: Diagnosis not present

## 2023-10-06 DIAGNOSIS — Z01812 Encounter for preprocedural laboratory examination: Secondary | ICD-10-CM

## 2023-10-06 DIAGNOSIS — N921 Excessive and frequent menstruation with irregular cycle: Secondary | ICD-10-CM | POA: Insufficient documentation

## 2023-10-06 DIAGNOSIS — Z1231 Encounter for screening mammogram for malignant neoplasm of breast: Secondary | ICD-10-CM

## 2023-10-06 LAB — PREGNANCY, URINE: Preg Test, Ur: NEGATIVE

## 2023-10-06 NOTE — Patient Instructions (Signed)
 Hysterectomy Information  A hysterectomy is a surgery to take out the uterus. The uterus is where a baby grows when a person is pregnant. Other organs may also be taken out. They are: The organs that make eggs (ovaries). The tubes that move the egg to the uterus (fallopian tubes). The lowest part of the uterus (cervix). After the surgery, you'll no longer have your period. You'll not be able to get pregnant. This surgery can affect the way you feel. Talk with your health care provider about the physical and emotional effects of this surgery. Why is a hysterectomy done? This surgery may be done if: You have growths in the uterus, such as fibroids. This is the most common reason. The lining of the uterus grows outside the uterus. Your uterus has moved down and bulges down into the vagina. You have abnormal or heavy bleeding during your period. You have pain in the pelvic area, and the pain lasts a long time. You have cancer of the uterus or cervix. What are the risks of hysterectomy? Your provider will talk with you about risks. These may include: Bleeding or blood clots in the legs or lung. Infection. Injury to areas close to the uterus. These include the nerves, bladder, or bowel. Reaction to the medicines used. Early symptoms of menopause, if both ovaries are taken out. These include hot flashes, vaginal dryness, night sweats, and lack of sleep. What can be taken out during a hysterectomy? This surgery can be done: To take out the top part of the uterus only. The cervix is not taken out. To take out the uterus and cervix. To take out the uterus, the cervix, and the tissue that holds the uterus. In some cases, your ovaries may also be taken out. The parts that will be taken out are based on the reason why you need this surgery. What are types of this surgery? The uterus can be taken out in many ways. Talk with your provider about the best surgery for you.  Here are the ones that are  done most often. There are benefits and risks for each. Abdominal hysterectomy One big cut is made in your belly. The uterus and other organs are taken out through this cut. This method is used: When your provider wants a better way to get to your uterus. If you have scars inside your belly that stick to other parts or organs. These are called adhesions. If you have this surgery: You may take longer to get well because of the big cut in your belly. There's a higher risk of injury to other tissues and organs. You may get adhesions. Vaginal hysterectomy Cuts are made in the top of the vagina. No cuts are made on your skin. The uterus is taken out through the vagina. If you have this surgery: You may have a shorter stay in the hospital. There's a lower risk of getting adhesions. You may have fewer problems after surgery. Laparoscopic-assisted vaginal hysterectomy (LAVH) Many small cuts are made in your belly and inside your vagina. LAVH is done with long, thin tools and cameras. Robots are also used. LAVH takes longer. There's also higher risk of injury to other organs. But there's less bleeding. If you have LAVH: There's less risk that germs will get into your body. You'll have a short stay in the hospital. You'll go back to your normal activities sooner. What happens after the surgery? You will be given pain medicine. You may need to stay in the hospital  for 1-2 days. You may need to have an adult stay with you for a few days after you go home. You will not be able to lift heavy objects. You will not be able to put anything in your vagina for the first 6 weeks or for the time told by your provider. This includes douching, having sex, and using tampons. If your ovaries were taken out, you may get hot flashes. You may also have night sweats, vaginal dryness, and trouble sleeping. Questions to ask your health care provider Is this surgery needed? What other options do I have? What organs  need to be taken out? How long will I need to stay in the hospital? How long will I need to get better at home? What symptoms can I expect after the surgery? This information is not intended to replace advice given to you by your health care provider. Make sure you discuss any questions you have with your health care provider. Document Revised: 04/16/2023 Document Reviewed: 10/31/2022 Elsevier Patient Education  2024 [employer].

## 2023-10-08 ENCOUNTER — Encounter: Payer: Self-pay | Admitting: Obstetrics and Gynecology

## 2023-10-08 LAB — SURGICAL PATHOLOGY

## 2023-10-12 ENCOUNTER — Encounter: Payer: Self-pay | Admitting: Internal Medicine

## 2023-10-13 LAB — LAB REPORT - SCANNED: EGFR: 92

## 2023-10-23 ENCOUNTER — Ambulatory Visit

## 2023-10-23 ENCOUNTER — Ambulatory Visit
Admission: RE | Admit: 2023-10-23 | Discharge: 2023-10-23 | Disposition: A | Discharge status: Home / Self Care | Source: Ambulatory Visit | Attending: Obstetrics and Gynecology | Admitting: Obstetrics and Gynecology

## 2023-10-23 DIAGNOSIS — Z1231 Encounter for screening mammogram for malignant neoplasm of breast: Secondary | ICD-10-CM

## 2023-10-27 ENCOUNTER — Encounter: Payer: Self-pay | Admitting: Obstetrics and Gynecology

## 2023-10-29 ENCOUNTER — Ambulatory Visit: Admitting: Obstetrics and Gynecology

## 2023-10-29 ENCOUNTER — Encounter: Payer: Self-pay | Admitting: Obstetrics and Gynecology

## 2023-10-29 VITALS — BP 120/80 | HR 79

## 2023-10-29 DIAGNOSIS — N8003 Adenomyosis of the uterus: Secondary | ICD-10-CM | POA: Diagnosis not present

## 2023-10-29 DIAGNOSIS — N946 Dysmenorrhea, unspecified: Secondary | ICD-10-CM | POA: Diagnosis not present

## 2023-10-29 DIAGNOSIS — Z862 Personal history of diseases of the blood and blood-forming organs and certain disorders involving the immune mechanism: Secondary | ICD-10-CM

## 2023-10-29 DIAGNOSIS — N921 Excessive and frequent menstruation with irregular cycle: Secondary | ICD-10-CM | POA: Diagnosis not present

## 2023-10-29 NOTE — Progress Notes (Signed)
 Surgical consult from Dr. Edward Jolly   HPI: Per Dr. Rica Records last note 43 y.o.   Married  [place] female   [phone] with Patient's last menstrual period was 10/10/2023 (exact date).  H/o LTCS x2 with tubal ligation here for: pelvic ultrasound and endometrial biopsy.  Patient has heavy and irregular menstrual bleeding and dysmenorrhea affecting the quality of her life.  She had had a tubal ligation, denies future childbearing, and is working toward hysterectomy.   Had previously used Mirena x 2 and had heavy bleeding with the second IUD. Progesterone only pills were also not helpful.  Prior endometrial biopsy 03/24/22 - later proliferative endometrium.  No hyperplasia or malignancy.  Labs from 07/15/23 with Dr. Duanne Guess, patient's PCP, scanned in Epic.  Hgb 11.6 and TSH 1.15.   UPT today:  negative.    st Result Released: Yes (seen)     Messages: Seen   0 Result Notes     1 Patient Communication     View Follow-Up Encounter    Component Ref Range & Units (hover) 3 wk ago  SURGICAL PATHOLOGY SURGICAL PATHOLOGY CASE: [id number] PATIENT: Barth Kirks Surgical Pathology Report     Clinical History: menorrhagia with irregular menstruation (cm)     FINAL MICROSCOPIC DIAGNOSIS:  A. ENDOMETRIUM, BIOPSY: - Benign secretory endometrium - Negative for hyperplasia or malignancy      GROSS DESCRIPTION:      GYNECOLOGIC HISTORY: Patient's last menstrual period was 10/10/2023 (exact date). 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  Patient will call to schedule this.  Colonoscopy last week:  hyperplastic polyp - follow up not defined yet.   Narrative & Impression  Indication:  menorrhagia with irregular menses,  dysmenorrhea.   Findings: Pelvic US:  Uterus 9.04 x 5.09 x 4.44 cm.  No myometrial masses.  EMS 6.01 mm.  No masses.  Avascular.  Left ovary 3.37 x 2.44 x 1.84 cm.  Right ovary 3.5 x 2.12 x 1.94 cm.  Right ovarian complex cyst 19 x 16 mm.  No adnexal masses.  No free fluid.    Impression:  Normal uterus, small complex benign appearing right ovarian cyst.           Today, patient reports horrible periods that have affected the quality of her life.  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  She is using supertampon with double pads every hour for about 3 days then light after. Is taking iron for the anemia She states the ocp'sshe is taking now is not helping reduce her pain or bleeding. She would like a definitive treatment with the [facility].  She does not desire any future pregnancies and understands it is a permanent procedure.        OB History     Gravida  2   Para  2   Term  2   Preterm      AB      Living  2      SAB      IAB      Ectopic  Multiple      Live Births  2              Patient Active Problem List   Diagnosis Date Noted   Seasonal allergies 11/06/2022   Viral illness 11/06/2022   Elevated blood pressure reading with diagnosis of hypertension 11/06/2022   Routine general medical examination at a health care facility 11/24/2019   IUD check up 11/02/2015   Prediabetes 11/02/2015   Mitral valve prolapse 12/18/2011    Past Medical History:  Diagnosis Date   Diabetes mellitus without complication (HCC)    Elevated hemoglobin A1c 04/08/2022   level 5.8 wtih PCP   Frequent headaches    HTN (hypertension)    Mitral valve prolapse     Past Surgical History:  Procedure Laterality Date   CESAREAN SECTION     x 2   TUBAL LIGATION      Current Outpatient Medications  Medication Sig Dispense Refill    Cholecalciferol 50 MCG (2000 UT) TABS      clobetasol cream (TEMOVATE) 0.05 % Apply 1 Application topically 2 (two) times daily.     CVS IRON 325 (65 Fe) MG tablet Take 325 mg by mouth daily.     folic acid (FOLVITE) 400 MCG tablet 1 tablet Orally Once a day     tirzepatide (MOUNJARO) 5 MG/0.5ML Pen Inject 5 mg into the skin once a week.     valACYclovir (VALTREX) 1000 MG tablet Take 1,000 mg by mouth.     valsartan (DIOVAN) 80 MG tablet Take 80 mg by mouth daily.     No current facility-administered medications for this visit.     ALLERGIES: Diclofenac, Meloxicam, Codeine, Hydrocodone-acetaminophen, and Oxycontin [oxycodone hcl]  Family History  Problem Relation Age of Onset   Cirrhosis Mother    Cancer Father        prostate   Breast cancer Sister 26   Cancer Maternal Aunt 25       lung   Diabetes Maternal Aunt    Cancer Maternal Aunt 54       ovarian   Diabetes Maternal Aunt    Diabetes Maternal Uncle    Diabetes Maternal Grandmother    Diabetes Maternal Grandfather    Diabetes Cousin    Diabetes Other    Hypertension Other    Colon cancer Neg Hx    Colon polyps Neg Hx    Esophageal cancer Neg Hx    Rectal cancer Neg Hx    Stomach cancer Neg Hx     Social History   Socioeconomic History   Marital status: Married    Spouse name: Not on file   Number of children: 2   Years of education: Not on file   Highest education level: Not on file  Occupational History    Employer: [employer]    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  Tobacco Use   Smoking status: Never   Smokeless tobacco: Never  Vaping Use   Vaping status: Never Used  Substance and Sexual Activity   Alcohol use: No   Drug use: No   Sexual activity: Yes    Partners: Male    Birth control/protection: Surgical    Comment: BTL  Other Topics Concern   Not on file  Social History Narrative   Not on file   Social Drivers of Health   Financial Resource Strain: Not on file  Food Insecurity: Not on file   Transportation Needs: Not on file  Physical Activity: Not on  file  Stress: Not on file  Social Connections: Not on file  Intimate Partner Violence: Not on file    Review of Systems  All other systems reviewed and are negative.   PHYSICAL EXAMINATION:   BP 120/80   Pulse 79   LMP 10/10/2023 (Exact Date)   SpO2 97%     General appearance: alert, cooperative and appears stated age     Pelvic US  Uterus 9.04 x 5.09 x 4.44 cm.  No myometrial masses.  EMS 6.01 mm.   No masses.  Avascular.  Left ovary 3.37 x 2.44 x 1.84 cm.  Right ovary 3.5 x 2.12 x 1.94 cm.  Right ovarian complex cyst 19 x 16 mm.  No adnexal masses.  No free fluid.     ASSESSMENT:  Menorrhagia with irregular menses.  Dysmenorrhea. Status post BTL.  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  Pap 09/08/23 normal and HR HPV negative  Hx C/S x 2.  FH breast, prostate, ovarian and lung cancer.  Genetic counseling and potential testing in April, 2025.  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  She would like to have the [facility].  Counseled extensively on the procedure including but not limited to what to expect and risks and benefits.  Counseled on postop care and pelvic rest for 10 weeks after the surgery with restricted lifting for 6 weeks after.  Counseled on the benefits of the robotic procedure with faster return to daily activities, improved outcomes, and less risk for complications. She would like to have this scheduled. To return for preop exam  30 minutes spent on reviewing records, imaging,  and one on one patient time and counseling patient and documentation Dr. Karma Greaser  Dr. Karma Greaser

## 2023-11-16 ENCOUNTER — Inpatient Hospital Stay: Payer: Commercial Managed Care - PPO | Attending: Genetic Counselor | Admitting: Genetic Counselor

## 2023-11-16 ENCOUNTER — Encounter: Payer: Self-pay | Admitting: Genetic Counselor

## 2023-11-16 ENCOUNTER — Inpatient Hospital Stay: Payer: Commercial Managed Care - PPO

## 2023-11-16 DIAGNOSIS — Z8042 Family history of malignant neoplasm of prostate: Secondary | ICD-10-CM

## 2023-11-16 DIAGNOSIS — Z803 Family history of malignant neoplasm of breast: Secondary | ICD-10-CM

## 2023-11-16 DIAGNOSIS — Z801 Family history of malignant neoplasm of trachea, bronchus and lung: Secondary | ICD-10-CM

## 2023-11-16 DIAGNOSIS — Z1501 Genetic susceptibility to malignant neoplasm of breast: Secondary | ICD-10-CM

## 2023-11-16 LAB — GENETIC SCREENING ORDER

## 2023-11-16 NOTE — Progress Notes (Signed)
 REFERRING PROVIDER: Nella Bame, FNP [address] [place],  [place] [address]  PRIMARY PROVIDER:  Nella Bame, FNP  PRIMARY REASON FOR VISIT:  1. Family history of malignant neoplasm of breast      HISTORY OF PRESENT ILLNESS:   [name], a 43 y.o. female, was seen for a [place] cancer genetics consultation at the request of Dr. Carol Chroman due to a family history of cancer.  [name] presents to clinic today to discuss the possibility of a hereditary predisposition to cancer, genetic testing, and to further clarify her future cancer risks, as well as potential cancer risks for family members.   [name] is a 43 y.o. female with no personal history of cancer.    RELEVANT MEDICAL HISTORY:  Menarche was at age 33.  First live birth at age 38.  OCP use for approximately 0 years.  Ovaries intact: yes.  Hysterectomy: no. Planning for a hysterectomy in June 2025 due to menorrhagia and an ovarian cyst.  Menopausal status: premenopausal.  HRT use: 0 years. Colonoscopy: yes;  one hyperplastic polyp, 09/29/23 . Mammogram within the last year: yes. 10/23/23, density category b Number of breast biopsies: 0. Up to date with pelvic exams: yes. Any excessive radiation exposure in the past: no  Past Medical History:  Diagnosis Date   Diabetes mellitus without complication (HCC)    Elevated hemoglobin A1c 04/08/2022   level 5.8 wtih PCP   Frequent headaches    HTN (hypertension)    Mitral valve prolapse     Past Surgical History:  Procedure Laterality Date   CESAREAN SECTION     x 2   TUBAL LIGATION      Social History   Socioeconomic History   Marital status: Married    Spouse name: Not on file   Number of children: 2   Years of education: Not on file   Highest education level: Not on file  Occupational History    Employer: [employer]    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  Tobacco Use   Smoking status: Never   Smokeless tobacco: Never  Vaping Use   Vaping status: Never  Used  Substance and Sexual Activity   Alcohol use: No   Drug use: No   Sexual activity: Yes    Partners: Male    Birth control/protection: Surgical    Comment: BTL  Other Topics Concern   Not on file  Social History Narrative   Not on file   Social Drivers of Health   Financial Resource Strain: Not on file  Food Insecurity: Not on file  Transportation Needs: Not on file  Physical Activity: Not on file  Stress: Not on file  Social Connections: Not on file     FAMILY HISTORY:  We obtained a detailed, 4-generation family history.  Significant diagnoses are listed below: Family History  Problem Relation Age of Onset   Cirrhosis Mother    Cancer Father        prostate   Breast cancer Sister 73   Cancer Maternal Aunt 5       lung   Diabetes Maternal Aunt    Cancer Maternal Aunt 64       cervical, unknown   Diabetes Maternal Aunt    Diabetes Maternal Uncle    Diabetes Maternal Grandmother    Diabetes Maternal Grandfather    Diabetes Cousin    Diabetes Other    Hypertension Other    Colon cancer Neg Hx    Colon polyps  Neg Hx    Esophageal cancer Neg Hx    Rectal cancer Neg Hx    Stomach cancer Neg Hx     [name] is unaware of previous family history of genetic testing for hereditary cancer risks. There is no reported Ashkenazi Jewish ancestry. [name] reports her sister, age 29, was diagnosed with breast cancer at age 77. She reports a maternal aunt, age 21, diagnosed with lung cancer at 92, smoking history. She reports a maternal aunt, age 25, diagnosed with cervical cancer at 65 and an unknown cancer at age 71, that she reports she will have for the rest of her life. [name] reports her father was diagnosed with prostate cancer and passed away due to metastases. She shared that she has questioned if he is her biologic father. Limited information regarding paternal health history.     GENETIC COUNSELING ASSESSMENT: [name] is a 43 y.o. female with a family  history of cancer which is suggestive of an inherited predisposition to cancer given the family history of breast cancer in a first degree relative under age 10. We, therefore, discussed and recommended the following at today's visit.   DISCUSSION: We discussed that, in general, most cancer is not inherited in families, but instead is sporadic or familial. Sporadic cancers occur by chance and typically happen at older ages (>50 years) as this type of cancer is caused by genetic changes acquired during an individual's lifetime. Some families have more cancers than would be expected by chance; however, the ages or types of cancer are not consistent with a known genetic mutation or known genetic mutations have been ruled out. This type of familial cancer is thought to be due to a combination of multiple genetic, environmental, hormonal, and lifestyle factors. While this combination of factors likely increases the risk of cancer, the exact source of this risk is not currently identifiable or testable.  We discussed that 5 - 10% of cancer is hereditary. We discussed that testing is beneficial for several reasons including knowing how to screen individuals for cancer, identify preventative treatments, and to understand if other family members could be at risk for cancer and allow them to undergo genetic testing.   We reviewed the characteristics, features and inheritance patterns of hereditary cancer syndromes. We also discussed genetic testing, including the appropriate family members to test, the process of testing, insurance coverage and turn-around-time for results. We discussed the implications of a negative, positive, carrier and/or variant of uncertain significant result. [name]  was offered a common hereditary cancer panel (36+ genes) and an expanded pan-cancer panel (70+ genes). [name] was informed of the benefits and limitations of each panel, including that expanded pan-cancer panels contain genes  that do not have clear management guidelines at this point in time.  We also discussed that as the number of genes included on a panel increases, the chances of variants of uncertain significance increases. [name] decided to pursue genetic testing for the CancerNext + RNAinsight gene panel. The [facility] CancerNext+RNAinsight Panel includes sequencing, rearrangement analysis, and RNA analysis for the following 39 genes: APC, ATM, BAP1, BARD1, BMPR1A, BRCA1, BRCA2, BRIP1, CDH1, CDKN2A, CHEK2, FH, FLCN, MET, MLH1, MSH2, MSH6, MUTYH, NF1, NTHL1, PALB2, PMS2, PTEN, RAD51C, RAD51D, RPS20, SMAD4, STK11, TP53, TSC1, TSC2, and VHL (sequencing and deletion/duplication); AXIN2, HOXB13, MBD4, MSH3, POLD1 and POLE (sequencing only); EPCAM and GREM1 (deletion/duplication only).   Based on Ms. Birt's family history of cancer, she meets medical criteria for genetic testing.  Though Ms. Lear is not personally affected, there are no affected family members that are willing/able/available to undergo hereditary cancer testing.  Therefore, Ms. Thorpeis the most informative family member available.  Despite that she meets criteria, she may still have an out of pocket cost. We discussed that if her out of pocket cost for testing is over $100, the laboratory will call and confirm whether she wants to proceed with testing.  If the out of pocket cost of testing is less than $100 she will be billed by the genetic testing laboratory.   We discussed that some people do not want to undergo genetic testing due to fear of genetic discrimination.  The Genetic Information Nondiscrimination Act (GINA) was signed into federal law in 2008. GINA prohibits health insurers and most employers from discriminating against individuals based on genetic information (including the results of genetic tests and family history information). According to GINA, health insurance companies cannot consider genetic information to be a preexisting condition, nor  can they use it to make decisions regarding coverage or rates. GINA also makes it illegal for most employers to use genetic information in making decisions about hiring, firing, promotion, or terms of employment. It is important to note that GINA does not offer protections for life insurance, disability insurance, or long-term care insurance. GINA does not apply to those in the [employer], those who work for companies with less than 15 employees, and new life insurance or long-term disability insurance policies.  Health status due to a cancer diagnosis is not protected under GINA. More information about GINA can be found by visiting [email or web address].  The Tyrer-Cuzick model is one of multiple prediction models developed to estimate an individual's lifetime risk of developing breast cancer. The Tyrer-Cuzick model is endorsed by the [employer] (NCCN). This model includes many risk factors such as family history, endogenous estrogen exposure, and benign breast disease. The calculation is highly-dependent on the accuracy of clinical data provided by the patient and can change over time. The Tyrer-Cuzick model may be repeated to reflect new information in her personal or family history in the future. This model will be run if genetic test results are uninformative.   PLAN: After considering the risks, benefits, and limitations, Ms. Sholl provided informed consent to pursue genetic testing and the blood sample was sent to [facility] for analysis of the CancerNext +RNAinsight test. Results should be available within approximately 2-3 weeks' time, at which point they will be disclosed by telephone to Ms. Silbernagel, as will any additional recommendations warranted by these results. Ms. Kochanowski will receive a summary of her genetic counseling visit and a copy of her results once available. This information will also be available in Epic.   Based on Ms. Brickle's family history, we  recommended her sister, who was diagnosed with breast at age 52, have genetic counseling and testing. Ms. Ivins will let us  know if we can be of any assistance in coordinating genetic counseling and/or testing for this family member.   Lastly, we encouraged Ms. Steinhaus to remain in contact with cancer genetics annually so that we can continuously update the family history and inform her of any changes in cancer genetics and testing that may be of benefit for this family.   Ms. Branan's questions were answered to her satisfaction today. Our contact information was provided should additional questions or concerns arise. Thank you for the referral and allowing us  to share in the care of your patient.  Jobie Mulders, MS, [facility] Licensed, Retail [email or web address] phone: [phone]  45 minutes were spent on the date of the encounter in service to the patient including preparation, face-to-face consultation, documentation and care coordination. The patient was seen alone.  Drs. Johnna Nakai, and/or Gudena were available for questions, if needed..    _______________________________________________________________________ For Office Staff:  Number of people involved in session: 1 Was an Intern/ student involved with case: no

## 2023-12-04 ENCOUNTER — Telehealth: Payer: Self-pay | Admitting: Genetic Counselor

## 2023-12-04 ENCOUNTER — Ambulatory Visit: Payer: Self-pay | Admitting: Genetic Counselor

## 2023-12-04 DIAGNOSIS — Z1379 Encounter for other screening for genetic and chromosomal anomalies: Secondary | ICD-10-CM

## 2023-12-04 NOTE — Telephone Encounter (Signed)
 I spoke to Diane Wilson to review results of genetic testing. she had genetic testing with [facility] CancerNext +RNA insight panel. Testing did not identify any variants known to increase the risk for cancer.  Discussed that we do not know why she  why there is cancer in the family. It could be due to a different gene that we are not testing, or maybe our current technology may not be able to pick something up.  It will be important for her to keep in contact with genetics to keep up with whether additional testing may be needed.  Please see counseling note for further detail on this result.

## 2023-12-07 DIAGNOSIS — Z1379 Encounter for other screening for genetic and chromosomal anomalies: Secondary | ICD-10-CM | POA: Insufficient documentation

## 2023-12-07 NOTE — Progress Notes (Signed)
 HPI:  Diane Wilson was previously seen in the [facility] due to a family history of cancer and concerns regarding a hereditary predisposition to cancer. Please refer to our prior cancer genetics clinic note for more information regarding our discussion, assessment and recommendations, at the time. Diane Wilson's recent genetic test results were disclosed to her, as were recommendations warranted by these results. These results and recommendations are discussed in more detail below.  FAMILY HISTORY:  We obtained a detailed, 4-generation family history.  Significant diagnoses are listed below: Family History  Problem Relation Age of Onset   Cirrhosis Mother    Cancer Father        prostate   Breast cancer Sister 91   Cancer Maternal Aunt 88       lung   Diabetes Maternal Aunt    Cancer Maternal Aunt 64       cervical, unknown   Diabetes Maternal Aunt    Diabetes Maternal Uncle    Diabetes Maternal Grandmother    Diabetes Maternal Grandfather    Diabetes Cousin    Diabetes Other    Hypertension Other    Colon cancer Neg Hx    Colon polyps Neg Hx    Esophageal cancer Neg Hx    Rectal cancer Neg Hx    Stomach cancer Neg Hx     Diane Wilson is unaware of previous family history of genetic testing for hereditary cancer risks. There is no reported Ashkenazi Jewish ancestry. Diane Wilson reports her sister, age 93, was diagnosed with breast cancer at age 88. She reports a maternal aunt, age 16, diagnosed with lung cancer at 41, smoking history. She reports a maternal aunt, age 37, diagnosed with cervical cancer at 15 and an unknown cancer at age 41, that she reports she will have for the rest of her life. Diane Wilson reports her father was diagnosed with prostate cancer and passed away due to metastases. She shared that she has questioned if he is her biologic father. Limited information regarding paternal health history.      GENETIC TEST RESULTS: Genetic testing reported out  on 11/27/23 through the [facility] +RNAinsight panel found no pathogenic mutations. The [facility] CancerNext+RNAinsight Panel includes sequencing, rearrangement analysis, and RNA analysis for the following 40 genes: APC, ATM, BAP1, BARD1, BMPR1A, BRCA1, BRCA2, BRIP1, CDH1, CDKN2A, CHEK2, FH, FLCN, MET, MLH1, MSH2, MSH6, MUTYH, NF1, NTHL1, PALB2, PMS2, PTEN, RAD51C, RAD51D, RPS20, SMAD4, STK11, TP53, TSC1, TSC2 and VHL (sequencing and deletion/duplication); AXIN2, HOXB13, MBD4, MSH3, POLD1 and POLE (sequencing only); EPCAM and GREM1 (deletion/duplication only). The test report has been scanned into EPIC and is located under the Molecular Pathology section of the Results Review tab.  A portion of the result report is included below for reference.     We discussed with Diane Wilson that because current genetic testing is not perfect, it is possible there may be a gene mutation in one of these genes that current testing cannot detect, but that chance is small.  We also discussed, that there could be another gene that has not yet been discovered, or that we have not yet tested, that is responsible for the cancer diagnoses in the family. It is also possible there is a hereditary cause for the cancer in the family that Diane Wilson did not inherit and therefore was not identified in her testing.  Therefore, it is important to remain in touch with cancer genetics in the future so that we can continue  to offer Diane Wilson the most up to date genetic testing.   ADDITIONAL GENETIC TESTING: We discussed with Diane Wilson that there are other genes that are associated with increased cancer risk that can be analyzed. Should Diane Wilson wish to pursue additional genetic testing, we are happy to discuss and coordinate this testing, at any time.    CANCER SCREENING RECOMMENDATIONS: Diane Wilson test result is considered negative (normal).  This means that we have not identified a hereditary cause for her family history of cancer at  this time. Most cancers happen by chance and this negative test suggests that her family history of cancer may fall into this category.    Possible reasons for Diane Wilson's negative genetic test include:  1. There may be a gene mutation in one of these genes that current testing methods cannot detect but that chance is small.  2. There could be another gene that has not yet been discovered, or that we have not yet tested, that is responsible for the cancer diagnoses in the family.  3.  There may be no hereditary risk for cancer in the family. The cancers in Diane Wilson and/or her family may be sporadic/familial or due to other genetic and environmental factors. 4. It is also possible there is a hereditary cause for the cancer in the family that Diane Wilson did not inherit.  Therefore, it is recommended she continue to follow the cancer management and screening guidelines provided by her primary healthcare providers. An individual's cancer risk and medical management are not determined by genetic test results alone. Overall cancer risk assessment incorporates additional factors, including personal medical history, family history, and any available genetic information that may result in a personalized plan for cancer prevention and surveillance  Given Diane Wilson's family histories, we must interpret these negative results with some caution.  Families with features suggestive of hereditary risk for cancer tend to have multiple family members with cancer, diagnoses in multiple generations and diagnoses before the age of 46. Diane Wilson's family exhibits some of these features. Thus, this result may simply reflect our current inability to detect all mutations within these genes or there may be a different gene that has not yet been discovered or tested.   An individual's cancer risk and medical management are not determined by genetic test results alone. Overall cancer risk assessment incorporates additional  factors, including personal medical history, family history, and any available genetic information that may result in a personalized plan for cancer prevention and surveillance.  Based on Diane Wilson. Manso's family of cancer, as well as her genetic test results, statistical models Barton Bos) was used to estimate her risk of developing breast cancer. This estimates her lifetime risk of developing breast to be approximately 9.3% (compared to general population risk estimated at 10.3%.  The patient's lifetime breast cancer risk is a preliminary estimate based on available information using one of several models endorsed by the [employer] (NCCN).  The NCCN recommends consideration of breast MRI screening as an adjunct to mammography for patients at high risk (defined as 20% or greater lifetime risk). Please note that a woman's breast cancer risk changes over time. It may increase or decrease based on age and any changes to the personal and/or family medical history. If she is interested in an updated breast cancer risk assessment at a later date, she can contact us .  RECOMMENDATIONS FOR FAMILY MEMBERS:  Individuals in this family might be at some increased risk of  developing cancer, over the general population risk, simply due to the family history of cancer.  We recommended women in this family have a yearly mammogram beginning at age 74, or 57 years younger than the earliest onset of cancer, an annual clinical breast exam, and perform monthly breast self-exams. Women in this family should also have a gynecological exam as recommended by their primary provider. All family members should be referred for colonoscopy starting at age 61, or 7 years younger than the earliest onset of cancer.  It is also possible there is a hereditary cause for the cancer in Diane Wilson. Wherry's family that she did not inherit and therefore was not identified in her.  Based on Diane Wilson. Brumbach's family history, we  recommended her sister, who was diagnosed with breast cancer at age 72, have genetic counseling and testing. Diane Wilson. Krengel will let us  know if we can be of any assistance in coordinating genetic counseling and/or testing for this family member.   FOLLOW-UP: Lastly, we discussed with Diane Wilson. Skalski that cancer genetics is a rapidly advancing field and it is possible that new genetic tests will be appropriate for her and/or her family members in the future. We encouraged her to remain in contact with cancer genetics on an annual basis so we can update her personal and family histories and let her know of advances in cancer genetics that may benefit this family.   Our contact number was provided. Diane Wilson. Anthis's questions were answered to her satisfaction, and she knows she is welcome to call us  at anytime with additional questions or concerns.   Jobie Mulders, Diane Wilson, [facility] Licensed, Retail [email or web address] [phone]

## 2023-12-29 ENCOUNTER — Encounter: Payer: Self-pay | Admitting: *Deleted

## 2024-01-11 ENCOUNTER — Ambulatory Visit (INDEPENDENT_AMBULATORY_CARE_PROVIDER_SITE_OTHER): Admitting: Obstetrics and Gynecology

## 2024-01-11 ENCOUNTER — Encounter: Payer: Self-pay | Admitting: Obstetrics and Gynecology

## 2024-01-11 VITALS — BP 118/82 | HR 85 | Ht 63.25 in | Wt 189.6 lb

## 2024-01-11 DIAGNOSIS — N946 Dysmenorrhea, unspecified: Secondary | ICD-10-CM

## 2024-01-11 DIAGNOSIS — Z01818 Encounter for other preprocedural examination: Secondary | ICD-10-CM | POA: Diagnosis not present

## 2024-01-11 DIAGNOSIS — N921 Excessive and frequent menstruation with irregular cycle: Secondary | ICD-10-CM

## 2024-01-11 DIAGNOSIS — Z01810 Encounter for preprocedural cardiovascular examination: Secondary | ICD-10-CM

## 2024-01-11 MED ORDER — ACETAMINOPHEN 500 MG PO TABS: 500.0000 mg | ORAL_TABLET | Freq: Four times a day (QID) | ORAL | 0 refills | Status: DC | PRN

## 2024-01-11 MED ORDER — METOCLOPRAMIDE HCL 10 MG PO TABS: 10.0000 mg | ORAL_TABLET | Freq: Three times a day (TID) | ORAL | 0 refills | Status: DC | PRN

## 2024-01-11 MED ORDER — HYDROMORPHONE HCL 2 MG PO TABS: 1.0000 mg | ORAL_TABLET | ORAL | 0 refills | Status: DC | PRN

## 2024-01-11 NOTE — Patient Instructions (Signed)
 Robotic Laparoscopic Hysterectomy, Care After  The following information offers guidance on how to care for yourself after your procedure. Your health care provider may also give you more specific instructions. If you have problems or questions, contact your health care provider. What can I expect after the procedure? After the procedure, it is common to have: Pain, bruising, and numbness around your incisions. Tiredness (fatigue). Poor appetite. Less interest in sex. Vaginal discharge or bleeding. You will need to use a sanitary pad after this procedure.  HEAVY BLEEDING LIKE A PERIOD IS NOT NORMAL.  PLEASE CALL YOUR PROVIDER IF SOAKING A PAD. Feelings of sadness or other emotions. If your ovaries were also removed, it is also common to have symptoms of menopause, such as hot flashes, night sweats, and lack of sleep (insomnia).  Ovaries should stay in if at all possible until at least the age of 87. Follow these instructions at home: Medicines Take over-the-counter and prescription medicines only as told by your health care provider. Ask your health care provider if the medicine prescribed to you: Requires you to avoid driving or using machinery. Can cause constipation. You may need to take these actions to prevent or treat constipation: Drink enough fluid to keep your urine pale yellow. Take over-the-counter or prescription medicines. Eat foods that are high in fiber, such as beans, whole grains, and fresh fruits and vegetables. Limit foods that are high in fat and processed sugars, such as fried or sweet foods.  Also, avoid spicy foods.  NAUSEA IS COMMON THE FIRST NIGHT OF SURGERY.  IF IT LASTS BEYOND 24 HOURS, CALL YOUR PROVIDER.  NAUSEA MEDICATION WAS GIVEN AT YOUR PREOP APPOINTMENT THAT YOU CAN TAKE AFTER SURGERY. Incision care  Follow instructions from your health care provider about how to take care of your incisions. Make sure you: LEAVE INCISION OPEN AND DRY-NO BANDAGES Leave  stitches (sutures), skin glue, or adhesive strips in place UNTIL 2 WEEKS THEN REMOVE IN THE SHOWER.  If adhesive strip edges start to loosen and curl up, you may trim the loose edges. Check your incision areas every day for signs of infection. Check for: More redness, swelling, or pain. Fluid or blood. Warmth. Pus or a bad smell. Activity  Rest as told by your health care provider. Avoid sitting for a long time without moving. Get up to take short walks every 1-2 hours. This is important to improve blood flow and breathing. Ask for help if you feel weak or unsteady. Return to your normal activities as told by your health care provider. Ask your health care provider what activities are safe for you. Do not lift anything that is heavier than 10 lb (4.5 kg), or the limit that you are told, for 8 WEEKS after surgery or until your health care provider says that it is safe. If you were given a sedative during the procedure, it can affect you for several hours. Do not drive or operate machinery until your health care provider says that it is safe. Lifestyle Do not use any products that contain nicotine or tobacco. These products include cigarettes, chewing tobacco, and vaping devices, such as e-cigarettes. These can delay healing after surgery. If you need help quitting, ask your health care provider. Do not drink alcohol until your health care provider approves. General instructions FOR 2 WEEKS AFTER SURGERY, THEN YOU MAY USE TUBS AND HOT TUBS Do not douche, use tampons, or have sex for at least 10 weeks, or as told by your health care  provider. If you struggle with physical or emotional changes after your procedure, speak with your health care provider or a therapist. Do not take baths, swim, or use a hot tub until your health care provider approves. You may only be allowed to take showers for 2 weeks. IF YOU HAVE BURNING WITH URINATION, PLEASE CALL YOUR DOCTOR. BLADDER INFECTIONS MAY OCCUR AFTER  SURGERY Try to have someone at home with you for the first 1-2 weeks to help with your daily chores. Wear compression stockings as told by your health care provider. These stockings help to prevent blood clots and reduce swelling in your legs. Keep all follow-up visits. This is important. Contact a health care provider if: You have any of these signs of infection: Chills or a fever 12f OR GREATER. More redness, swelling, or pain around an incision. Fluid or blood coming from an incision. Warmth coming from an incision. Pus or a bad smell coming from an incision. Burning with urination. Urinary frequency or cramping.   IF YOU HAVE THESE SYMPTOMS, PLEASE CALL THE OFFICE TO COME EVALUATE FOR A BLADDER INFECTION AT [phone] An incision opens. You feel dizzy or light-headed. You have pain or bleeding when you urinate, or you are unable to urinate. You have abnormal vaginal discharge. You have pain that does not get better with medicine. Get help right away if: You have a fever and your symptoms suddenly get worse. You have severe abdominal pain. You have chest pain or shortness of breath. You may have chest pain and shortness of breath from the CO2 gas for a few days after surgery.  This is very common.  Walking, Gas-X and motrin will usually help relieve this discomfort You faint. You have pain, swelling, or redness in your leg. You have heavy vaginal bleeding with blood clots, soaking through a sanitary pad in less than 1 hour. These symptoms may represent a serious problem that is an emergency. Do not wait to see if the symptoms will go away. Get medical help right away. Call your local emergency services (911 in the [place] Do not drive yourself to the hospital. Summary  CONSTIPATION MEDICATION AFTER SURGERY: COLACE, MOM, MIRALAX, GAS X are all helpful to have on hand, if needed.  FILL ALL POSTOP MEDICATION BEFORE SURGERY  After the procedure, it is common to have pain and bruising  around your incisions. Do not take baths, swim, or use a hot tub until your health care provider approves. Do not lift anything that is heavier than 8- 10 lb (4.5 kg), or the limit that you are told, for one month after surgery or until your health care provider says that it is safe. Tell your health care provider if you have any signs or symptoms of infection after the procedure. Get help right away if you have severe abdominal pain, chest pain, shortness of breath, or heavy bleeding from your vagina. This information is not intended to replace advice given to you by your  health care provider. Make sure you discuss any questions you have with your health care provider. Document Revised: 03/15/2020 Document Reviewed: 03/16/2020 Elsevier Patient Education  2024 [employer].

## 2024-01-11 NOTE — Progress Notes (Signed)
 PREOP EXAM For [facility], bilateral salpingectomy, cystoscopy  Surgical consult from Dr. Colvin Dec   HPI: Per Dr. Newt Barefoot last note 43 y.o.   Married  [place]   [phone] with No LMP recorded.  H/o LTCS x2 with tubal ligation here for: pelvic ultrasound and endometrial biopsy.  Patient has heavy and irregular menstrual bleeding and dysmenorrhea affecting the quality of her life.  She had had a tubal ligation, denies future childbearing, and is working toward hysterectomy.   Had previously used Mirena  x 2 and had heavy bleeding with the second IUD. Progesterone only pills were also not helpful.  Prior endometrial biopsy 03/24/22 - later proliferative endometrium.  No hyperplasia or malignancy.  Labs from 07/15/23 with Dr. Avon Boers, patient's PCP, scanned in Epic.  Hgb 11.6 and TSH 1.15.   UPT today:  negative.    st Result Released: Yes (seen)     Messages: Seen   0 Result Notes     1 Patient Communication     View Follow-Up Encounter    Component Ref Range & Units (hover) 3 wk ago  SURGICAL PATHOLOGY SURGICAL PATHOLOGY CASE: [id number] PATIENT: Diane Wilson Surgical Pathology Report     Clinical History: menorrhagia with irregular menstruation (cm)     FINAL MICROSCOPIC DIAGNOSIS:  A. ENDOMETRIUM, BIOPSY: - Benign secretory endometrium - Negative for hyperplasia or malignancy      GROSS DESCRIPTION:      GYNECOLOGIC HISTORY: No LMP recorded. 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  Patient will call to schedule this.  Colonoscopy last week:  hyperplastic polyp - follow up not defined yet.   Narrative & Impression  Indication:  menorrhagia with irregular menses, dysmenorrhea.    Findings: Pelvic US :  Uterus 9.04 x 5.09 x 4.44 cm.  No myometrial masses.  EMS 6.01 mm.  No masses.  Avascular.  Left ovary 3.37 x 2.44 x 1.84 cm.  Right ovary 3.5 x 2.12 x 1.94 cm.  Right ovarian complex cyst 19 x 16 mm.  No adnexal masses.  No free fluid.    Impression:  Normal uterus, small complex benign appearing right ovarian cyst.           Today, patient reports horrible periods that have affected the quality of her life.  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  She is using supertampon with double pads every hour for about 3 days then light after. Is taking iron for the anemia She states the ocp'sshe is taking now is not helping reduce her pain or bleeding. She would like a definitive treatment with the [facility].  She does not desire any future pregnancies and understands it is a permanent procedure.        OB History     Gravida  2   Para  2   Term  2   Preterm      AB      Living  2      SAB      IAB      Ectopic  Multiple      Live Births  2              Patient Active Problem List   Diagnosis Date Noted   Genetic testing 12/07/2023   Seasonal allergies 11/06/2022   Viral illness 11/06/2022   Elevated blood pressure reading with diagnosis of hypertension 11/06/2022   Routine general medical examination at a health care facility 11/24/2019   IUD check up 11/02/2015   Prediabetes 11/02/2015   Mitral valve prolapse 12/18/2011    Past Medical History:  Diagnosis Date   Diabetes mellitus without complication (HCC)    Elevated hemoglobin A1c 04/08/2022   level 5.8 wtih PCP   Frequent headaches    HTN (hypertension)    Mitral valve prolapse     Past Surgical History:  Procedure Laterality Date   CESAREAN SECTION     x 2   TUBAL LIGATION      Current Outpatient Medications  Medication Sig  Dispense Refill   Cholecalciferol 50 MCG (2000 UT) TABS      clobetasol cream (TEMOVATE) 0.05 % Apply 1 Application topically 2 (two) times daily.     [employer] IRON 325 (65 Fe) MG tablet Take 325 mg by mouth daily.     folic acid (FOLVITE) 400 MCG tablet 1 tablet Orally Once a day     tirzepatide (MOUNJARO) 5 MG/0.5ML Pen Inject 5 mg into the skin once a week.     valACYclovir (VALTREX) 1000 MG tablet Take 1,000 mg by mouth.     valsartan (DIOVAN) 80 MG tablet Take 80 mg by mouth daily.     No current facility-administered medications for this visit.     ALLERGIES: Diclofenac , Meloxicam , Codeine, Hydrocodone-acetaminophen , and Oxycontin [oxycodone hcl]  Family History  Problem Relation Age of Onset   Cirrhosis Mother    Cancer Father        prostate   Breast cancer Sister 70   Cancer Maternal Aunt 63       lung   Diabetes Maternal Aunt    Cancer Maternal Aunt 64       cervical, unknown   Diabetes Maternal Aunt    Diabetes Maternal Uncle    Diabetes Maternal Grandmother    Diabetes Maternal Grandfather    Diabetes Cousin    Diabetes Other    Hypertension Other    Colon cancer Neg Hx    Colon polyps Neg Hx    Esophageal cancer Neg Hx    Rectal cancer Neg Hx    Stomach cancer Neg Hx     Social History   Socioeconomic History   Marital status: Married    Spouse name: Not on file   Number of children: 2   Years of education: Not on file   Highest education level: Not on file  Occupational History    Employer: [employer]    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  Tobacco Use   Smoking status: Never   Smokeless tobacco: Never  Vaping Use   Vaping status: Never Used  Substance and Sexual Activity   Alcohol use: No   Drug use: No   Sexual activity: Yes    Partners: Male    Birth control/protection: Surgical    Comment: BTL  Other Topics Concern   Not on file  Social History Narrative   Not on file   Social Drivers of Health   Financial Resource Strain: Not on file  Food  Insecurity: Not on file  Transportation Needs: Not on  file  Physical Activity: Not on file  Stress: Not on file  Social Connections: Not on file  Intimate Partner Violence: Not on file    Review of Systems  All other systems reviewed and are negative.   PHYSICAL EXAMINATION:   There were no vitals taken for this visit.    General appearance: alert, cooperative and appears stated age     Pelvic US   Uterus 9.04 x 5.09 x 4.44 cm.  No myometrial masses.  EMS 6.01 mm.   No masses.  Avascular.  Left ovary 3.37 x 2.44 x 1.84 cm.  Right ovary 3.5 x 2.12 x 1.94 cm.  Right ovarian complex cyst 19 x 16 mm.  No adnexal masses.  No free fluid.     ASSESSMENT:  Menorrhagia with irregular menses.  Dysmenorrhea. Status post BTL.  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  Pap 09/08/23 normal and HR HPV negative  Hx C/S x 2.  FH breast, prostate, ovarian and lung cancer.  Genetic counseling and potential testing in April, 2025.  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  Desires [facility] Preop today for [facility], bilateral salpingectomy, cystoscopy  PLAN:  The risks of surgery were discussed in detail with the patient including but not limited to: bleeding which may require transfusion or reoperation; infection which may require prolonged hospitalization or re-hospitalization and antibiotic therapy; injury to bowel, bladder, ureters and major vessels or other surrounding organs which may lead to other procedures; formation of adhesions; need for additional procedures including laparotomy or subsequent procedures secondary to intraoperative injury or abnormal pathology; thromboembolic phenomenon; incisional problems and other postoperative or anesthesia complications.  The postoperative expectations were also discussed in detail. The patient also understands the alternative treatment options which were discussed in full. All questions were answered.  Patient would like to proceed with the procedure.  Dr. Tia Flowers   30 minutes  spent on reviewing records, imaging,  and one on one patient time and counseling patient and documentation Dr. Tia Flowers

## 2024-01-11 NOTE — H&P (View-Only) (Signed)
 PREOP EXAM For RLH, bilateral salpingectomy, cystoscopy  Surgical consult from Dr. Colvin Dec   HPI: Per Dr. Newt Barefoot last note 43 y.o.   Married  [place] female   [phone] with No LMP recorded.  H/o LTCS x2 with tubal ligation here for: pelvic ultrasound and endometrial biopsy.  Patient has heavy and irregular menstrual bleeding and dysmenorrhea affecting the quality of her life.  She had had a tubal ligation, denies future childbearing, and is working toward hysterectomy.   Had previously used Mirena  x 2 and had heavy bleeding with the second IUD. Progesterone only pills were also not helpful.  Prior endometrial biopsy 03/24/22 - later proliferative endometrium.  No hyperplasia or malignancy.  Labs from 07/15/23 with Dr. Avon Boers, patient's PCP, scanned in Epic.  Hgb 11.6 and TSH 1.15.   UPT today:  negative.    st Result Released: Yes (seen)     Messages: Seen   0 Result Notes     1 Patient Communication     View Follow-Up Encounter    Component Ref Range & Units (hover) 3 wk ago  SURGICAL PATHOLOGY SURGICAL PATHOLOGY CASE: [id number] PATIENT: Diane Wilson Surgical Pathology Report     Clinical History: menorrhagia with irregular menstruation (cm)     FINAL MICROSCOPIC DIAGNOSIS:  A. ENDOMETRIUM, BIOPSY: - Benign secretory endometrium - Negative for hyperplasia or malignancy      GROSS DESCRIPTION:      GYNECOLOGIC HISTORY: No LMP recorded. 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  Patient will call to schedule this.  Colonoscopy last week:  hyperplastic polyp - follow up not defined yet.   Narrative & Impression  Indication:  menorrhagia with irregular menses, dysmenorrhea.    Findings: Pelvic US :  Uterus 9.04 x 5.09 x 4.44 cm.  No myometrial masses.  EMS 6.01 mm.  No masses.  Avascular.  Left ovary 3.37 x 2.44 x 1.84 cm.  Right ovary 3.5 x 2.12 x 1.94 cm.  Right ovarian complex cyst 19 x 16 mm.  No adnexal masses.  No free fluid.    Impression:  Normal uterus, small complex benign appearing right ovarian cyst.           Today, patient reports horrible periods that have affected the quality of her life.  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  She is using supertampon with double pads every hour for about 3 days then light after. Is taking iron for the anemia She states the ocp'sshe is taking now is not helping reduce her pain or bleeding. She would like a definitive treatment with the [facility].  She does not desire any future pregnancies and understands it is a permanent procedure.        OB History     Gravida  2   Para  2   Term  2   Preterm      AB      Living  2      SAB      IAB      Ectopic  Multiple      Live Births  2              Patient Active Problem List   Diagnosis Date Noted   Genetic testing 12/07/2023   Seasonal allergies 11/06/2022   Viral illness 11/06/2022   Elevated blood pressure reading with diagnosis of hypertension 11/06/2022   Routine general medical examination at a health care facility 11/24/2019   IUD check up 11/02/2015   Prediabetes 11/02/2015   Mitral valve prolapse 12/18/2011    Past Medical History:  Diagnosis Date   Diabetes mellitus without complication (HCC)    Elevated hemoglobin A1c 04/08/2022   level 5.8 wtih PCP   Frequent headaches    HTN (hypertension)    Mitral valve prolapse     Past Surgical History:  Procedure Laterality Date   CESAREAN SECTION     x 2   TUBAL LIGATION      Current Outpatient Medications  Medication Sig  Dispense Refill   Cholecalciferol 50 MCG (2000 UT) TABS      clobetasol cream (TEMOVATE) 0.05 % Apply 1 Application topically 2 (two) times daily.     [employer]S IRON 325 (65 Fe) MG tablet Take 325 mg by mouth daily.     folic acid (FOLVITE) 400 MCG tablet 1 tablet Orally Once a day     tirzepatide (MOUNJARO) 5 MG/0.5ML Pen Inject 5 mg into the skin once a week.     valACYclovir (VALTREX) 1000 MG tablet Take 1,000 mg by mouth.     valsartan (DIOVAN) 80 MG tablet Take 80 mg by mouth daily.     No current facility-administered medications for this visit.     ALLERGIES: Diclofenac , Meloxicam , Codeine, Hydrocodone-acetaminophen , and Oxycontin [oxycodone hcl]  Family History  Problem Relation Age of Onset   Cirrhosis Mother    Cancer Father        prostate   Breast cancer Sister 70   Cancer Maternal Aunt 63       lung   Diabetes Maternal Aunt    Cancer Maternal Aunt 64       cervical, unknown   Diabetes Maternal Aunt    Diabetes Maternal Uncle    Diabetes Maternal Grandmother    Diabetes Maternal Grandfather    Diabetes Cousin    Diabetes Other    Hypertension Other    Colon cancer Neg Hx    Colon polyps Neg Hx    Esophageal cancer Neg Hx    Rectal cancer Neg Hx    Stomach cancer Neg Hx     Social History   Socioeconomic History   Marital status: Married    Spouse name: Not on file   Number of children: 2   Years of education: Not on file   Highest education level: Not on file  Occupational History    Employer: [employer]    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  Tobacco Use   Smoking status: Never   Smokeless tobacco: Never  Vaping Use   Vaping status: Never Used  Substance and Sexual Activity   Alcohol use: No   Drug use: No   Sexual activity: Yes    Partners: Male    Birth control/protection: Surgical    Comment: BTL  Other Topics Concern   Not on file  Social History Narrative   Not on file   Social Drivers of Health   Financial Resource Strain: Not on file  Food  Insecurity: Not on file  Transportation Needs: Not on  file  Physical Activity: Not on file  Stress: Not on file  Social Connections: Not on file  Intimate Partner Violence: Not on file    Review of Systems  All other systems reviewed and are negative.   PHYSICAL EXAMINATION:   There were no vitals taken for this visit.    General appearance: alert, cooperative and appears stated age     Pelvic US   Uterus 9.04 x 5.09 x 4.44 cm.  No myometrial masses.  EMS 6.01 mm.   No masses.  Avascular.  Left ovary 3.37 x 2.44 x 1.84 cm.  Right ovary 3.5 x 2.12 x 1.94 cm.  Right ovarian complex cyst 19 x 16 mm.  No adnexal masses.  No free fluid.     ASSESSMENT:  Menorrhagia with irregular menses.  Dysmenorrhea. Status post BTL.  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  Pap 09/08/23 normal and HR HPV negative  Hx C/S x 2.  FH breast, prostate, ovarian and lung cancer.  Genetic counseling and potential testing in April, 2025.  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  Desires [facility] Preop today for [facility], bilateral salpingectomy, cystoscopy  PLAN:  The risks of surgery were discussed in detail with the patient including but not limited to: bleeding which may require transfusion or reoperation; infection which may require prolonged hospitalization or re-hospitalization and antibiotic therapy; injury to bowel, bladder, ureters and major vessels or other surrounding organs which may lead to other procedures; formation of adhesions; need for additional procedures including laparotomy or subsequent procedures secondary to intraoperative injury or abnormal pathology; thromboembolic phenomenon; incisional problems and other postoperative or anesthesia complications.  The postoperative expectations were also discussed in detail. The patient also understands the alternative treatment options which were discussed in full. All questions were answered.  Patient would like to proceed with the procedure.  Dr. Tia Flowers   30 minutes  spent on reviewing records, imaging,  and one on one patient time and counseling patient and documentation Dr. Tia Flowers

## 2024-01-12 ENCOUNTER — Encounter (HOSPITAL_COMMUNITY): Payer: Self-pay | Admitting: Obstetrics and Gynecology

## 2024-01-12 NOTE — Pre-Procedure Instructions (Signed)
 Surgical Instructions   Your procedure is scheduled on :  Monday,  01-18-2024. Report to [facility] Main Entrance A at 11:00  A.M., then check in with the Admitting office. Any questions or running late day of surgery: call [phone]  Questions prior to your surgery date: call [phone], Monday-Friday, 8am-4pm. If you experience any cold or flu symptoms such as cough, fever, chills, shortness of breath, etc. between now and your scheduled surgery, please notify your surgeon office.    Remember:  Do not eat any food after midnight the night before your surgery.  You may have clear liquids from midnight night before surgery until 10:00 AM.     Clear liquids allowed are:  Water Carbonated Beverages Clear Tea (may sweeten, no milk, honey, etc.) Black Coffee Only (may sweeten, NO MILK, CREAM OR POWDERED CREAMER of any kind) Sport drinks ,  like Gatorade  NO clear liquids after 10:00 AM day of surgery.  This includes no water,  candy,  gum,  and  mints.    Take these medicines the morning of surgery with A SIP OF WATER : none   May take these medicines IF NEEDED:  none    One week prior to surgery, STOP taking any Aspirin (unless otherwise instructed by your surgeon) Aleve , Naproxen , Ibuprofen , Motrin , Advil , Goody's, BC's, all herbal medications, fish oil, and non-prescription vitamins.                     Do NOT Smoke (Tobacco/Vaping) and Do Not drink alcohol for 24 hours prior to your procedure.  If you use a CPAP at night, you may bring your mask/headgear for your overnight stay.   You will be asked to remove any contacts, glasses, piercing's, hearing aid's, dentures/partials prior to surgery. Please bring cases for these items if needed.    Patients discharged the day of surgery will not be allowed to drive home, and someone needs to stay with them for 24 hours.  SURGICAL WAITING ROOM VISITATION Patients may have no more than 2 support people in the waiting area -  these visitors may rotate.   Pre-op nurse will coordinate an appropriate time for 1 ADULT support person, who may not rotate, to accompany patient in pre-op.  Children under the age of 18 must have an adult with them who is not the patient and must remain in the main waiting area with an adult.  If the patient needs to stay at the hospital during part of their recovery, the visitor guidelines for inpatient rooms apply.  Please refer to the [facility] website for the visitor guidelines for any additional information.   If you received a COVID test during your pre-op visit  it is requested that you wear a mask when out in public, stay away from anyone that may not be feeling well and notify your surgeon if you develop symptoms. If you have been in contact with anyone that has tested positive in the last 10 days please notify you surgeon.      Pre-operative CHG Bathing Instructions   You can play a key role in reducing the risk of infection after surgery. Your skin needs to be as free of germs as possible. You can reduce the number of germs on your skin by washing with CHG (chlorhexidine gluconate) soap before surgery. CHG is an antiseptic soap that kills germs and continues to kill germs even after washing.   DO NOT use if you have an allergy to  chlorhexidine/CHG or antibacterial soaps. If your skin becomes reddened or irritated, stop using the CHG and notify Pre-op nurse day of surgery.  ~~Please get dial soap and shower following the instructions below.             TAKE A SHOWER THE NIGHT BEFORE SURGERY AND THE DAY OF SURGERY    Please keep in mind the following:  DO NOT shave, including legs and underarms, 48 hours prior to surgery.   You may shave your face before/day of surgery.  Place clean sheets on your bed the night before surgery Use a clean washcloth (not used since being washed) for each shower. DO NOT sleep with pet's night before surgery.  CHG Shower Instructions:  Wash  your face and private area with normal soap. If you choose to wash your hair, wash first with your normal shampoo.  After you use shampoo/soap, rinse your hair and body thoroughly to remove shampoo/soap residue.  Turn the water OFF and apply half the bottle of CHG soap to a CLEAN washcloth.  Apply CHG soap ONLY FROM YOUR NECK DOWN TO YOUR TOES (washing for 3-5 minutes)  DO NOT use CHG soap on face, private areas, open wounds, or sores.  Pay special attention to the area where your surgery is being performed.  If you are having back surgery, having someone wash your back for you may be helpful. Wait 2 minutes after CHG soap is applied, then you may rinse off the CHG soap.  Pat dry with a clean towel  Put on clean pajamas    Additional instructions for the day of surgery: DO NOT APPLY any lotions,  powder,  oils,  deodorants (may use underarm deodorant)  , cologne/  perfumes  or makeup Do not wear jewelry /  piercing's/  metal/  permanent jewelry must be removed prior to arrival day of surgery.  (No plastic piercing) Do not wear nail polish, gel polish, artificial nails, or any other type of covering on natural finger nails (toe nails are okay) Do not bring valuables to the hospital. [facility] is not responsible for valuables/personal belongings. Put on clean/comfortable clothes.  Please brush your teeth. Ask your nurse before applying any prescription medications to the skin.

## 2024-01-12 NOTE — Progress Notes (Signed)
 Spoke w/ via phone for pre-op interview--- pt Lab needs dos----  upt       Lab results------ lab appt 01-14-2024 @ 1300 getting CBC/ BMP T&S COVID test -----patient states asymptomatic no test needed Arrive at -------  1100 on [phone] NPO after MN NO Solid Food.  Clear liquids from MN until--- 1000 Pre-Surgery Ensure or G2: n/a  Med rec completed Medications to take morning of surgery ----- none Diabetic medication ----- n/a  GLP1 agonist last dose:  pt stated last dose 01-10-2024 GLP1 instructions:  pt stated was given instructions by office not to do again until after surgery  Patient instructed no nail polish to be worn day of surgery Patient instructed to bring photo id and insurance card day of surgery Patient aware to have Driver (ride ) / caregiver    for 24 hours after surgery - husband, Diane Wilson Patient Special Instructions ----- will pick up bag w/ soap and written instructions at lab appt Pre-Op special Instructions -----  n/a  Patient verbalized understanding of instructions that were given at this phone interview. Patient denies chest pain, sob, fever, cough at the interview.

## 2024-01-14 ENCOUNTER — Other Ambulatory Visit: Payer: Self-pay

## 2024-01-14 ENCOUNTER — Ambulatory Visit: Payer: Self-pay | Admitting: Obstetrics and Gynecology

## 2024-01-14 ENCOUNTER — Encounter (HOSPITAL_COMMUNITY)
Admission: RE | Admit: 2024-01-14 | Discharge: 2024-01-14 | Disposition: A | Discharge status: Home / Self Care | Source: Ambulatory Visit | Attending: Obstetrics and Gynecology | Admitting: Obstetrics and Gynecology

## 2024-01-14 DIAGNOSIS — Z01818 Encounter for other preprocedural examination: Secondary | ICD-10-CM

## 2024-01-14 LAB — TYPE AND SCREEN
ABO/RH(D): A POS
Antibody Screen: NEGATIVE

## 2024-01-14 LAB — BASIC METABOLIC PANEL WITH GFR
Anion gap: 10 (ref 5–15)
BUN: 7 mg/dL (ref 6–20)
CO2: 25 mmol/L (ref 22–32)
Calcium: 9 mg/dL (ref 8.9–10.3)
Chloride: 104 mmol/L (ref 98–111)
Creatinine, Ser: 0.81 mg/dL (ref 0.44–1.00)
GFR, Estimated: >60 mL/min (ref >60)
Glucose, Bld: 90 mg/dL (ref 70–99)
Potassium: 4 mmol/L (ref 3.5–5.1)
Sodium: 139 mmol/L (ref 135–145)

## 2024-01-14 LAB — CBC
HCT: 37.6 % (ref 36.0–46.0)
Hemoglobin: 11.9 g/dL — ABNORMAL LOW (ref 12.0–15.0)
MCH: 28.1 pg (ref 26.0–34.0)
MCHC: 31.6 g/dL (ref 30.0–36.0)
MCV: 88.7 fL (ref 80.0–100.0)
Platelets: 314 10*3/uL (ref 150–400)
RBC: 4.24 MIL/uL (ref 3.87–5.11)
RDW: 13 % (ref 11.5–15.5)
WBC: 5 10*3/uL (ref 4.0–10.5)
nRBC: 0 % (ref 0.0–0.2)

## 2024-01-14 MED ORDER — HYDROMORPHONE HCL 2 MG PO TABS: 1.0000 mg | ORAL_TABLET | ORAL | 0 refills | Status: DC | PRN

## 2024-01-14 NOTE — Telephone Encounter (Signed)
 Patient left message on RF line:  Hydromorphone not available at her [facility] [address] and she wanted to know where she needed to get her prescription.  I contacted [facility] and spoke to the pharmacist .  Pharmacist states Hydromorphone is on back order.  Pharmacist checked and [facility] has enough in stock to fill this prescription.  Pharmacist cancelled RX at [facility] and he stated new RX would need to be sent to [facility].  I spoke to the patient and let her know Dr. Tia Flowers would [facility] a [facility]  Patient verbalized understanding and agreed with plan. Sent to Dr. Tia Flowers for new prescription.

## 2024-01-18 ENCOUNTER — Other Ambulatory Visit: Payer: Self-pay

## 2024-01-18 ENCOUNTER — Ambulatory Visit (HOSPITAL_COMMUNITY)
Admission: RE | Admit: 2024-01-18 | Discharge: 2024-01-18 | Disposition: A | Discharge status: Home / Self Care | Attending: Obstetrics and Gynecology | Admitting: Obstetrics and Gynecology

## 2024-01-18 ENCOUNTER — Encounter (HOSPITAL_COMMUNITY): Payer: Self-pay | Admitting: Obstetrics and Gynecology

## 2024-01-18 ENCOUNTER — Ambulatory Visit (HOSPITAL_COMMUNITY): Admitting: Anesthesiology

## 2024-01-18 ENCOUNTER — Encounter (HOSPITAL_COMMUNITY)
Admission: RE | Disposition: A | Discharge status: Home / Self Care | Payer: Self-pay | Source: Home / Self Care | Attending: Obstetrics and Gynecology

## 2024-01-18 DIAGNOSIS — N920 Excessive and frequent menstruation with regular cycle: Secondary | ICD-10-CM | POA: Insufficient documentation

## 2024-01-18 DIAGNOSIS — D259 Leiomyoma of uterus, unspecified: Secondary | ICD-10-CM | POA: Insufficient documentation

## 2024-01-18 DIAGNOSIS — N921 Excessive and frequent menstruation with irregular cycle: Secondary | ICD-10-CM

## 2024-01-18 DIAGNOSIS — N838 Other noninflammatory disorders of ovary, fallopian tube and broad ligament: Secondary | ICD-10-CM | POA: Diagnosis not present

## 2024-01-18 DIAGNOSIS — D649 Anemia, unspecified: Secondary | ICD-10-CM | POA: Insufficient documentation

## 2024-01-18 DIAGNOSIS — N946 Dysmenorrhea, unspecified: Secondary | ICD-10-CM | POA: Diagnosis present

## 2024-01-18 DIAGNOSIS — N8003 Adenomyosis of the uterus: Secondary | ICD-10-CM | POA: Insufficient documentation

## 2024-01-18 DIAGNOSIS — Z9851 Tubal ligation status: Secondary | ICD-10-CM | POA: Insufficient documentation

## 2024-01-18 DIAGNOSIS — Z01818 Encounter for other preprocedural examination: Secondary | ICD-10-CM

## 2024-01-18 DIAGNOSIS — Z09 Encounter for follow-up examination after completed treatment for conditions other than malignant neoplasm: Secondary | ICD-10-CM

## 2024-01-18 HISTORY — DX: Unspecified osteoarthritis, unspecified site: M19.90

## 2024-01-18 HISTORY — DX: Dysmenorrhea, unspecified: N94.6

## 2024-01-18 HISTORY — DX: Excessive and frequent menstruation with irregular cycle: N92.1

## 2024-01-18 HISTORY — DX: Anemia, unspecified: D64.9

## 2024-01-18 HISTORY — DX: Gastro-esophageal reflux disease without esophagitis: K21.9

## 2024-01-18 LAB — POCT PREGNANCY, URINE: Preg Test, Ur: NEGATIVE

## 2024-01-18 LAB — ABO/RH: ABO/RH(D): A POS

## 2024-01-18 SURGERY — HYSTERECTOMY, TOTAL, LAPAROSCOPIC, ROBOT-ASSISTED WITH SALPINGECTOMY
Anesthesia: General | Site: Bladder

## 2024-01-18 MED ORDER — ACETAMINOPHEN 10 MG/ML IV SOLN: 1000.0000 mg | Freq: Once | INTRAVENOUS | Status: DC | PRN

## 2024-01-18 MED ORDER — ACETAMINOPHEN 500 MG PO TABS
1000.0000 mg | ORAL_TABLET | ORAL | Status: AC
Start: 2024-01-18 — End: 2024-01-18
  Administered 2024-01-18: 1000 mg via ORAL

## 2024-01-18 MED ORDER — POVIDONE-IODINE 10 % EX SWAB
2.0000 | Freq: Once | CUTANEOUS | Status: AC
  Administered 2024-01-18: 2 via TOPICAL

## 2024-01-18 MED ORDER — ORAL CARE MOUTH RINSE: 15.0000 mL | Freq: Once | OROMUCOSAL | Status: AC

## 2024-01-18 MED ORDER — SODIUM CHLORIDE 0.9 % IV SOLN
INTRAVENOUS | Status: DC
  Filled 2024-01-18: qty 10

## 2024-01-18 MED ORDER — FENTANYL CITRATE (PF) 250 MCG/5ML IJ SOLN
INTRAMUSCULAR | Status: DC | PRN
  Administered 2024-01-18: 150 ug via INTRAVENOUS

## 2024-01-18 MED ORDER — CHLORHEXIDINE GLUCONATE 0.12 % MT SOLN
15.0000 mL | Freq: Once | OROMUCOSAL | Status: AC
  Administered 2024-01-18: 15 mL via OROMUCOSAL

## 2024-01-18 MED ORDER — BUPIVACAINE HCL (PF) 0.5 % IJ SOLN
INTRAMUSCULAR | Status: DC | PRN
  Administered 2024-01-18: 10 mL

## 2024-01-18 MED ORDER — SODIUM CHLORIDE 0.9 % IV SOLN
INTRAVENOUS | Status: AC
  Filled 2024-01-18: qty 2

## 2024-01-18 MED ORDER — SODIUM CHLORIDE 0.9 % IR SOLN
Status: DC | PRN
  Administered 2024-01-18: 2000 mL via INTRAVESICAL

## 2024-01-18 MED ORDER — BUPIVACAINE HCL (PF) 0.5 % IJ SOLN
INTRAMUSCULAR | Status: AC
  Filled 2024-01-18: qty 30

## 2024-01-18 MED ORDER — FENTANYL CITRATE (PF) 250 MCG/5ML IJ SOLN
INTRAMUSCULAR | Status: AC
  Filled 2024-01-18: qty 5

## 2024-01-18 MED ORDER — METRONIDAZOLE 500 MG/100ML IV SOLN
500.0000 mg | Freq: Once | INTRAVENOUS | Status: AC
  Administered 2024-01-18: 500 mg via INTRAVENOUS

## 2024-01-18 MED ORDER — LACTATED RINGERS IV SOLN: INTRAVENOUS | Status: DC

## 2024-01-18 MED ORDER — KETAMINE HCL 50 MG/5ML IJ SOSY
PREFILLED_SYRINGE | INTRAMUSCULAR | Status: DC | PRN
  Administered 2024-01-18: 30 mg via INTRAVENOUS

## 2024-01-18 MED ORDER — SODIUM CHLORIDE 0.9 % IV SOLN
2.0000 g | INTRAVENOUS | Status: AC
  Administered 2024-01-18: 2 g via INTRAVENOUS

## 2024-01-18 MED ORDER — ACETAMINOPHEN 500 MG PO TABS
ORAL_TABLET | ORAL | Status: AC
  Filled 2024-01-18: qty 2

## 2024-01-18 MED ORDER — METRONIDAZOLE 500 MG/100ML IV SOLN
INTRAVENOUS | Status: AC
  Filled 2024-01-18: qty 100

## 2024-01-18 MED ORDER — 0.9 % SODIUM CHLORIDE (POUR BTL) OPTIME
TOPICAL | Status: DC | PRN
  Administered 2024-01-18: 1000 mL

## 2024-01-18 MED ORDER — LIDOCAINE 2% (20 MG/ML) 5 ML SYRINGE
INTRAMUSCULAR | Status: DC | PRN
  Administered 2024-01-18: 100 mg via INTRAVENOUS

## 2024-01-18 MED ORDER — ONDANSETRON HCL 4 MG/2ML IJ SOLN
INTRAMUSCULAR | Status: DC | PRN
  Administered 2024-01-18: 4 mg via INTRAVENOUS

## 2024-01-18 MED ORDER — MIDAZOLAM HCL 2 MG/2ML IJ SOLN
INTRAMUSCULAR | Status: DC | PRN
  Administered 2024-01-18: 2 mg via INTRAVENOUS

## 2024-01-18 MED ORDER — SODIUM CHLORIDE 0.9 % IV SOLN
INTRAVENOUS | Status: DC | PRN
  Administered 2024-01-18: 1000 mL

## 2024-01-18 MED ORDER — MIDAZOLAM HCL 2 MG/2ML IJ SOLN
INTRAMUSCULAR | Status: AC
Start: 2024-01-18 — End: 2024-01-18
  Filled 2024-01-18: qty 2

## 2024-01-18 MED ORDER — HEMOSTATIC AGENTS (NO CHARGE) OPTIME
TOPICAL | Status: DC | PRN
  Administered 2024-01-18: 1 via TOPICAL

## 2024-01-18 MED ORDER — SUGAMMADEX SODIUM 200 MG/2ML IV SOLN
INTRAVENOUS | Status: DC | PRN
  Administered 2024-01-18: 200 mg via INTRAVENOUS

## 2024-01-18 MED ORDER — KETAMINE HCL 50 MG/5ML IJ SOSY
PREFILLED_SYRINGE | INTRAMUSCULAR | Status: AC
  Filled 2024-01-18: qty 5

## 2024-01-18 MED ORDER — PROPOFOL 10 MG/ML IV BOLUS
INTRAVENOUS | Status: DC | PRN
  Administered 2024-01-18: 140 mg via INTRAVENOUS
  Administered 2024-01-18: 150 ug/kg/min via INTRAVENOUS

## 2024-01-18 MED ORDER — ONDANSETRON HCL 4 MG/2ML IJ SOLN: 4.0000 mg | Freq: Once | INTRAMUSCULAR | Status: DC | PRN

## 2024-01-18 MED ORDER — CHLORHEXIDINE GLUCONATE 0.12 % MT SOLN
OROMUCOSAL | Status: AC
  Filled 2024-01-18: qty 15

## 2024-01-18 MED ORDER — ROCURONIUM BROMIDE 10 MG/ML (PF) SYRINGE
PREFILLED_SYRINGE | INTRAVENOUS | Status: DC | PRN
  Administered 2024-01-18: 60 mg via INTRAVENOUS
  Administered 2024-01-18: 10 mg via INTRAVENOUS

## 2024-01-18 MED ORDER — FENTANYL CITRATE (PF) 100 MCG/2ML IJ SOLN: 25.0000 ug | INTRAMUSCULAR | Status: DC | PRN

## 2024-01-18 MED ORDER — DEXAMETHASONE SODIUM PHOSPHATE 10 MG/ML IJ SOLN
INTRAMUSCULAR | Status: AC
  Filled 2024-01-18: qty 1

## 2024-01-18 MED ORDER — ONDANSETRON HCL 4 MG/2ML IJ SOLN
INTRAMUSCULAR | Status: AC
  Filled 2024-01-18: qty 2

## 2024-01-18 MED ORDER — DEXAMETHASONE SODIUM PHOSPHATE 10 MG/ML IJ SOLN
INTRAMUSCULAR | Status: DC | PRN
  Administered 2024-01-18: 10 mg via INTRAVENOUS

## 2024-01-18 SURGICAL SUPPLY — 53 items
APPLICATOR ARISTA FLEXITIP XL (MISCELLANEOUS) IMPLANT
BARRIER ADHS 3X4 INTERCEED (GAUZE/BANDAGES/DRESSINGS) IMPLANT
COVER BACK TABLE 60X90IN (DRAPES) ×2 IMPLANT
COVER MAYO STAND STRL (DRAPES) ×2 IMPLANT
COVER TIP SHEARS 8 DVNC (MISCELLANEOUS) ×2 IMPLANT
DEFOGGER SCOPE WARM SEASHARP (MISCELLANEOUS) ×2 IMPLANT
DERMABOND ADVANCED .7 DNX12 (GAUZE/BANDAGES/DRESSINGS) ×2 IMPLANT
DRAPE ARM DVNC X/XI (DISPOSABLE) ×8 IMPLANT
DRAPE COLUMN DVNC XI (DISPOSABLE) ×2 IMPLANT
DRAPE SURG IRRIG POUCH 19X23 (DRAPES) ×2 IMPLANT
DRAPE UTILITY XL STRL (DRAPES) ×2 IMPLANT
DRIVER NDL MEGA SUTCUT DVNCXI (INSTRUMENTS) ×2 IMPLANT
DRIVER NDLE MEGA SUTCUT DVNCXI (INSTRUMENTS) ×2 IMPLANT
DURAPREP 26ML APPLICATOR (WOUND CARE) ×2 IMPLANT
ELECTRODE REM PT RTRN 9FT ADLT (ELECTROSURGICAL) ×2 IMPLANT
FORCEPS PROGRASP DVNC XI (FORCEP) ×2 IMPLANT
GAUZE 4X4 16PLY ~~LOC~~+RFID DBL (SPONGE) IMPLANT
GLOVE BIOGEL PI IND STRL 7.0 (GLOVE) ×4 IMPLANT
GLOVE NEODERM STER SZ 7 (GLOVE) ×6 IMPLANT
GOWN STRL REUS W/TWL LRG LVL3 (GOWN DISPOSABLE) ×2 IMPLANT
HEMOSTAT ARISTA ABSORB 3G PWDR (HEMOSTASIS) IMPLANT
HOLDER FOLEY CATH W/STRAP (MISCELLANEOUS) IMPLANT
IRRIGATION SUCT STRKRFLW 2 WTP (MISCELLANEOUS) ×2 IMPLANT
IV NS 1000ML BAXH (IV SOLUTION) IMPLANT
KIT PINK PAD W/HEAD ARM REST (MISCELLANEOUS) ×2 IMPLANT
KIT TURNOVER KIT B (KITS) ×2 IMPLANT
LEGGING LITHOTOMY PAIR STRL (DRAPES) ×2 IMPLANT
MANIFOLD NEPTUNE II (INSTRUMENTS) ×2 IMPLANT
NS IRRIG 1000ML POUR BTL (IV SOLUTION) ×2 IMPLANT
OBTURATOR OPTICALSTD 8 DVNC (TROCAR) ×2 IMPLANT
OCCLUDER COLPOPNEUMO (BALLOONS) IMPLANT
PACK CYSTO (CUSTOM PROCEDURE TRAY) ×2 IMPLANT
PACK ROBOT WH (CUSTOM PROCEDURE TRAY) ×2 IMPLANT
PACK ROBOTIC GOWN (GOWN DISPOSABLE) ×2 IMPLANT
PAD OB MATERNITY 11 LF (PERSONAL CARE ITEMS) ×2 IMPLANT
RUMI II 3.0CM BLUE KOH-EFFICIE (DISPOSABLE) IMPLANT
RUMI II GYRUS 2.5CM BLUE (DISPOSABLE) IMPLANT
RUMI II GYRUS 3.5CM BLUE (DISPOSABLE) IMPLANT
RUMI II GYRUS 4.0CM BLUE (DISPOSABLE) IMPLANT
SCISSORS MNPLR CVD DVNC XI (INSTRUMENTS) ×2 IMPLANT
SEAL UNIV 5-12 XI (MISCELLANEOUS) ×6 IMPLANT
SEALER VESSEL EXT DVNC XI (MISCELLANEOUS) IMPLANT
SET CYSTO W/LG BORE CLAMP LF (SET/KITS/TRAYS/PACK) ×2 IMPLANT
SET TUBE SMOKE EVAC HIGH FLOW (TUBING) ×2 IMPLANT
SLEEVE SCD COMPRESS KNEE MED (STOCKING) ×2 IMPLANT
SPIKE FLUID TRANSFER (MISCELLANEOUS) ×2 IMPLANT
SUT MNCRL AB 4-0 PS2 18 (SUTURE) ×2 IMPLANT
SUT VLOC 180 0 9IN GS21 (SUTURE) ×4 IMPLANT
TIP UTERINE 6.7X10CM GRN DISP (MISCELLANEOUS) IMPLANT
TIP UTERINE 6.7X8CM BLUE DISP (MISCELLANEOUS) IMPLANT
TOWEL GREEN STERILE (TOWEL DISPOSABLE) ×2 IMPLANT
TRAY FOLEY W/BAG SLVR 14FR (SET/KITS/TRAYS/PACK) ×2 IMPLANT
UNDERPAD 30X36 HEAVY ABSORB (UNDERPADS AND DIAPERS) ×2 IMPLANT

## 2024-01-18 NOTE — Interval H&P Note (Signed)
 History and Physical Interval Note:  01/18/2024 12:30 PM  Diane Wilson  has presented today for surgery, with the diagnosis of menorrhagia with irregular cycle, dysmenorrhea, Hx of anemia.  The various methods of treatment have been discussed with the patient and family. After consideration of risks, benefits and other options for treatment, the patient has consented to  Procedure(s): HYSTERECTOMY, TOTAL, LAPAROSCOPIC, ROBOT-ASSISTED WITH SALPINGECTOMY (Bilateral) CYSTOSCOPY (N/A) as a surgical intervention.  The patient's history has been reviewed, patient examined, no change in status, stable for surgery.  I have reviewed the patient's chart and labs.  Questions were answered to the patient's satisfaction.     [name]

## 2024-01-18 NOTE — Anesthesia Preprocedure Evaluation (Signed)
 Anesthesia Evaluation  Patient identified by MRN, date of birth, ID band Patient awake    Reviewed: Allergy & Precautions, NPO status , Patient's Chart, lab work & pertinent test results, reviewed documented beta blocker date and time   History of Anesthesia Complications Negative for: history of anesthetic complications  Airway Mallampati: II  TM Distance: >3 FB     Dental no notable dental hx.    Pulmonary neg COPD   breath sounds clear to auscultation       Cardiovascular hypertension, (-) CAD, (-) Past MI, (-) Cardiac Stents and (-) CABG  Rhythm:Regular Rate:Normal     Neuro/Psych neg Seizures    GI/Hepatic ,GERD  ,,(+) neg Cirrhosis        Endo/Other    Renal/GU Renal disease     Musculoskeletal  (+) Arthritis ,    Abdominal   Peds  Hematology  (+) Blood dyscrasia, anemia   Anesthesia Other Findings   Reproductive/Obstetrics                             Anesthesia Physical Anesthesia Plan  ASA: 2  Anesthesia Plan: General   Post-op Pain Management:    Induction: Intravenous  PONV Risk Score and Plan: 2 and Ondansetron and Dexamethasone  Airway Management Planned: Oral ETT  Additional Equipment:   Intra-op Plan:   Post-operative Plan: Extubation in OR  Informed Consent: I have reviewed the patients History and Physical, chart, labs and discussed the procedure including the risks, benefits and alternatives for the proposed anesthesia with the patient or authorized representative who has indicated his/her understanding and acceptance.     Dental advisory given  Plan Discussed with: CRNA  Anesthesia Plan Comments:        Anesthesia Quick Evaluation

## 2024-01-18 NOTE — Discharge Instructions (Signed)

## 2024-01-18 NOTE — Anesthesia Procedure Notes (Addendum)
 Procedure Name: Intubation Date/Time: 01/18/2024 2:04 PM  Performed by: Delores Duwaine SAUNDERS, CRNAPre-anesthesia Checklist: Patient identified, Emergency Drugs available, Suction available and Patient being monitored Patient Re-evaluated:Patient Re-evaluated prior to induction Oxygen Delivery Method: Circle System Utilized Preoxygenation: Pre-oxygenation with 100% oxygen Induction Type: IV induction Ventilation: Mask ventilation without difficulty Laryngoscope Size: Mac and 3 Grade View: Grade I Tube type: Oral Tube size: 7.0 mm Number of attempts: 1 Airway Equipment and Method: Stylet and Oral airway Placement Confirmation: ETT inserted through vocal cords under direct vision, positive ETCO2 and breath sounds checked- equal and bilateral Tube secured with: Tape Dental Injury: Teeth and Oropharynx as per pre-operative assessment

## 2024-01-18 NOTE — Transfer of Care (Signed)
 Immediate Anesthesia Transfer of Care Note  Patient: Diane Wilson  Procedure(s) Performed: HYSTERECTOMY, TOTAL, LAPAROSCOPIC, ROBOT-ASSISTED WITH SALPINGECTOMY (Bilateral: Abdomen) CYSTOSCOPY (Bladder)  Patient Location: PACU  Anesthesia Type:General  Level of Consciousness: oriented and drowsy  Airway & Oxygen Therapy: Patient Spontanous Breathing and Patient connected to face mask oxygen  Post-op Assessment: Report given to RN and Post -op Vital signs reviewed and stable  Post vital signs: Reviewed and stable  Last Vitals:  Vitals Value Taken Time  BP    Temp    Pulse    Resp    SpO2      Last Pain:  Vitals:   01/18/24 1116  TempSrc: Oral  PainSc: 0-No pain      Patients Stated Pain Goal: 6 (01/18/24 1116)  Complications: There were no known notable events for this encounter.

## 2024-01-18 NOTE — Op Note (Signed)
 01/18/2024  [id number] [name]        OPERATIVE REPORT   Preop Diagnosis: menorrhagia, dysmenorrhea, anemia, fibroids  Procedure: robotic hysterectomy, bilateral salpingectomy, cystoscopy   Surgeon: Dr. Almarie Sauer Deryl Giroux Assistant: Judyann Rattler, RN   Fluids: please see anesthesia report   Complications: None Anesthesia: General     Findings:  boggy contour 10cm uterus, normal ovaries and tubes with paratubal cysts Cystoscopy at the end of the case with normal bladder and patent ureters bilaterally.  White sediment noted from both ureters and urinalysis was sent.  one clear bleb noted on left uteruosacral ligament that was removed. Estimated blood loss: Minimal 25cc   Specimens: Uterus, cervix and bilateral tubes   Disposition of specimen: Pathology           Patient is taken to the operating room. She is placed in the supine position. She is a running IV in place. Informed consent was present on the chart. SCDs on her lower extremities and functioning properly. Patient was positioned while she was awake.  Her legs were placed in the low lithotomy position in [place] stirrups. Her arms were tucked by the side.  General endotracheal anesthesia was administered by the anesthesia staff without difficulty.       Dura prep was then used to prep the abdomen and Hibiclens was used to prep the inner thighs, perineum and vagina. Once 3 minutes had past the patient was draped in a normal standard fashion. A proper time out was performed and everyone agreed.  The legs were lifted to the high lithotomy position. A bivalve speculum was inserted into the vagina and the anterior lip of the cervix was grasped with single-tooth tenaculum.  The uterus sounded to 10 cm. Pratt dilators were used to dilate the cervix.  The RUMI uterine manipulator was obtained inserted into the endometrial cavity and the bulb of the disposable tip was inflated with 8 cc of normal saline. There was a  good fit of the KOH ring around the cervix. The tenaculum and bivavle speculum was removed. There is also good manipulation of the uterus.  A Foley catheter was placed to straight drain.  Clear urine was noted. Legs were lowered to the low lithotomy position and attention was turned the abdomen.   Superior to the umbilicus, marcaine 0.25% used to anesthetize the skin.  Using #11 blade, 8mm skin incision was made.  The 8mm robotic trocar and sleeve was inserted under direct visualization.  CO2 gas was  started and patient was placed in trendelenburg position.  Two additional 8mm ports were placed under direct visualization in the left and right lower quadrant.     Ureters were identifies.  Attention was turned to the left side. The left tube was elevated and the mesosalpinx was desiccated with the vessel sealer.  The left uterine ovarian pedicle was serially clamped cauterized and incised. Left round ligament was serially clamped cauterized and incised. The anterior and posterior peritoneum of the inferior leaf of the broad ligament were opened. The beginning of the bladder flap was created.  The bladder was taken down below the level of the KOH ring. The left uterine artery skeletonized and then just superior to the KOH ring this vessel was serially clamped, cauterized, and incised.   Attention was turned the right side.  The uterus was placed on stretch to the opposite side.    The mesosalpinx was incised freeing the tube. Then the right uterine ovarian pedicle was serially clamped cauterized  and incised. Next the right round ligament was serially clamped cauterized and incised. The anterior posterior peritoneum of the inferiorly for the broad ligament were opened. The anterior peritoneum was carried across to the dissection on the left side. The remainder of the bladder flap was created using sharp dissection. The bladder was well below the level of the KOH ring. The right uterine artery skeletonized. Then  the right uterine artery, above the level of the KOH ring, was serially clamped cauterized and incised. The uterus was devascularized at this point.   The colpotomy was performed.  This was carried around a circumferential fashion until the vaginal mucosa was completely incised in the specimen was freed.  The specimen was then delivered to the vagina intact.  A vaginal occlusive device was used to maintain the pneumoperitoneum   Instruments were changed with a needle driver and prograsp.  Using a 9 inch  zero V-lock suture, the cuff was closed by incorporating the anterior and posterior vaginal mucosa in each stitch. This was carried across all the way to the left corner and a running fashion. Two stitches were brought back towards the midline and the suture was cut flush with the vagina. The needle was brought out the pelvis. The pelvis was irrigated. All pedicles were inspected. No bleeding was noted.   Co2 pressures were lowered to 8mm Hg.  Again, no bleeding was noted.  Ureters were noted deep in the pelvis to be peristalsing.  At this point the procedure was completed.  The remaining instruments were removed.  The ports were removed under direct visualization of the laparoscope and the pneumoperitoneum was relieved.   The skin was then closed with subcuticular stitches of 3-0 Vicryl. The skin was cleansed Dermabond was applied. Attention was then turned the vagina and the cuff was inspected. No bleeding was noted.  The Foley catheter was removed.  Cystoscopy was performed.  No sutures or bladder injuries were noted.  Ureters were noted with normal urine jets from each one was seen.  Foley was left out after the cystoscopic fluid was drained and cystoscope removed.  Sponge, lap, needle, instrument counts were correct x2. Patient tolerated the procedure very well. She was awakened from anesthesia, extubated and taken to recovery in stable condition.      Dr. Glennon

## 2024-01-19 ENCOUNTER — Encounter (HOSPITAL_COMMUNITY): Payer: Self-pay | Admitting: Obstetrics and Gynecology

## 2024-01-19 LAB — CYTOLOGY - NON PAP

## 2024-01-19 NOTE — Anesthesia Postprocedure Evaluation (Signed)
 Anesthesia Post Note  Patient: Diane Wilson  Procedure(s) Performed: HYSTERECTOMY, TOTAL, LAPAROSCOPIC, ROBOT-ASSISTED WITH SALPINGECTOMY (Bilateral: Abdomen) CYSTOSCOPY (Bladder)     Patient location during evaluation: PACU Anesthesia Type: General Level of consciousness: awake and alert Pain management: pain level controlled Vital Signs Assessment: post-procedure vital signs reviewed and stable Respiratory status: spontaneous breathing, nonlabored ventilation, respiratory function stable and patient connected to nasal cannula oxygen Cardiovascular status: blood pressure returned to baseline and stable Postop Assessment: no apparent nausea or vomiting Anesthetic complications: no   There were no known notable events for this encounter.  Last Vitals:  Vitals:   01/18/24 1630 01/18/24 1645  BP: (!) 141/94 (!) 128/90  Pulse: 76 85  Resp: 12 18  Temp:    SpO2: 100% 99%    Last Pain:  Vitals:   01/18/24 1116  TempSrc: Oral  PainSc: 0-No pain                 Lynwood MARLA Cornea

## 2024-01-20 ENCOUNTER — Ambulatory Visit: Payer: Self-pay | Admitting: Obstetrics and Gynecology

## 2024-01-20 LAB — SURGICAL PATHOLOGY

## 2024-02-01 ENCOUNTER — Encounter: Admitting: Obstetrics and Gynecology

## 2024-02-04 ENCOUNTER — Ambulatory Visit (INDEPENDENT_AMBULATORY_CARE_PROVIDER_SITE_OTHER): Admitting: Obstetrics and Gynecology

## 2024-02-04 ENCOUNTER — Encounter: Payer: Self-pay | Admitting: Obstetrics and Gynecology

## 2024-02-04 VITALS — BP 120/72 | HR 76 | Wt 185.0 lb

## 2024-02-04 DIAGNOSIS — Z09 Encounter for follow-up examination after completed treatment for conditions other than malignant neoplasm: Secondary | ICD-10-CM

## 2024-02-04 MED ORDER — PREMARIN 0.625 MG/GM VA CREA: 1.0000 | TOPICAL_CREAM | Freq: Every day | VAGINAL | 0 refills | Status: DC

## 2024-02-04 NOTE — Addendum Note (Signed)
 Addended by: GLENNON ALMARIE POUR on: 02/04/2024 01:37 PM   Modules accepted: Orders

## 2024-02-04 NOTE — Progress Notes (Addendum)
 Patient presents for 2 week postop from [facility], bilateral salpingectomy, cystoscopy. She is doing well. No fevers, VB, dysuria or severe abdominal pain. She is glad she had the procedure  BP 120/72 (BP Location: Right Arm, Patient Position: Sitting, Cuff Size: Normal)   Pulse 76   Wt 185 lb (83.9 kg)   LMP 01/15/2024 (Approximate)   SpO2 99%   BMI 32.51 kg/m   Abdomen: incisions I/c/d, NT, ND. Stitch removed  A/p PO from [facility] 2 weeks doing well Encouraged no heavy lifting, pushing, pulling greater than 13 lbs for full 6 weeks 2.  STRICT Pelvic rest for the entire 10 wks may need longer based on exam and will need to be cleared before having intercourse.  Patient voiced understanding of risks with early intercourse 3. RTC with any concerns or with heavy bleeding, fevers or severe abdominal pain. 4. Schedule 6wk  and 10 wk PO today 5.  Estrogen cream sent to aid in vaginal cuff healing. Use at bedtime until seen again  Dr. Glennon

## 2024-02-25 ENCOUNTER — Encounter: Payer: Self-pay | Admitting: Obstetrics and Gynecology

## 2024-02-25 ENCOUNTER — Ambulatory Visit (INDEPENDENT_AMBULATORY_CARE_PROVIDER_SITE_OTHER): Admitting: Obstetrics and Gynecology

## 2024-02-25 VITALS — BP 122/78 | HR 79 | Wt 184.4 lb

## 2024-02-25 DIAGNOSIS — Z09 Encounter for follow-up examination after completed treatment for conditions other than malignant neoplasm: Secondary | ICD-10-CM

## 2024-02-25 NOTE — Progress Notes (Signed)
 Patient presents for 6 week postop from [facility], bilateral salpingectomy, cystoscopy. She is doing well. No fevers, VB, dysuria or severe abdominal pain.  BP 122/78   Pulse 79   Wt 184 lb 6.4 oz (83.6 kg)   LMP 01/15/2024 (Approximate)   SpO2 98%   BMI 32.41 kg/m   SVE: sutures seen and cuff NT to qtip palpation and healing appropriately discharge c/w vaginal estrogen use, normal discharge, no bleeding  A/p PO from [facility] 6 weeks doing well Resume activities 2. Pelvic rest until cleared. Counseled on risks with early intercourse such as but not limited to infection, cuff separation, antibiotics 3. RTC for 10 week PO or sooner with any concerns  Dr. Glennon

## 2024-03-17 ENCOUNTER — Ambulatory Visit (INDEPENDENT_AMBULATORY_CARE_PROVIDER_SITE_OTHER): Admitting: Obstetrics and Gynecology

## 2024-03-17 ENCOUNTER — Ambulatory Visit: Admitting: Obstetrics and Gynecology

## 2024-03-17 VITALS — BP 112/80 | HR 70 | Wt 183.4 lb

## 2024-03-17 DIAGNOSIS — Z09 Encounter for follow-up examination after completed treatment for conditions other than malignant neoplasm: Secondary | ICD-10-CM

## 2024-03-17 DIAGNOSIS — N898 Other specified noninflammatory disorders of vagina: Secondary | ICD-10-CM

## 2024-03-17 NOTE — Progress Notes (Signed)
 Patient presents for 10 week postop from [facility], bilateral salpingectomy, cystoscopy. She is doing well. No fevers, VB, dysuria or severe abdominal pain. Aunt recently diagnosed with ovarian cancer  BP 112/80   Pulse 70   Wt 183 lb 6.4 oz (83.2 kg)   LMP 01/15/2024 (Approximate)   SpO2 97%   BMI 32.23 kg/m   SVE: sutures seen and dissolving, normal discharge, no bleeding  A/p PO from [facility] 10 weeks doing well Resume activities no restrictions and may return to work 2. Pelvic rest for additional 3 wks 3. Encouraged annual mammograms and resume annual care   Dr. Glennon

## 2024-03-18 ENCOUNTER — Ambulatory Visit: Payer: Self-pay | Admitting: Obstetrics and Gynecology

## 2024-03-18 LAB — WET PREP FOR TRICH, YEAST, CLUE

## 2024-03-18 MED ORDER — FLUCONAZOLE 150 MG PO TABS: 150.0000 mg | ORAL_TABLET | Freq: Once | ORAL | 0 refills | Status: AC

## 2024-03-18 NOTE — Addendum Note (Signed)
 Addended by: GLENNON ALMARIE POUR on: 03/18/2024 09:30 AM   Modules accepted: Orders

## 2024-08-19 ENCOUNTER — Ambulatory Visit (HOSPITAL_COMMUNITY)
Admission: RE | Admit: 2024-08-19 | Discharge: 2024-08-19 | Disposition: A | Discharge status: Home / Self Care | Attending: Internal Medicine

## 2024-08-19 ENCOUNTER — Encounter (HOSPITAL_COMMUNITY): Payer: Self-pay

## 2024-08-19 VITALS — BP 106/71 | HR 79 | Temp 99.0°F | Resp 17

## 2024-08-19 DIAGNOSIS — M7711 Lateral epicondylitis, right elbow: Secondary | ICD-10-CM

## 2024-08-19 MED ORDER — PREDNISONE 20 MG PO TABS: 40.0000 mg | ORAL_TABLET | Freq: Every day | ORAL | 0 refills | Status: AC

## 2024-08-19 NOTE — ED Provider Notes (Signed)
 " [facility]    CSN: [id number] Arrival date & time: 08/19/24  1737      History   Chief Complaint Chief Complaint  Patient presents with   Appointment    HPI Diane Wilson is a 44 y.o. female.   Diane Wilson is a 44 y.o. female presenting for chief complaint of right lateral elbow pain that started a couple of weeks ago.  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  She is right-handed/right arm dominant.  Pain radiates from the lateral right elbow down the forearm into the right middle finger intermittently dependingon certain movements.  She also reports decreased grip strength of the right hand secondary to pain of the right lateral elbow.  She has never had this happen before and denies recent injuries to the right elbow.  Taking Tylenol , ibuprofen , and applying ice without much relief of pain.     Past Medical History:  Diagnosis Date   Anemia    Arthritis    right knee   Dysmenorrhea    GERD (gastroesophageal reflux disease)    01-12-2024 pt states meds   HTN (hypertension)    echo in epic 12-29-2011  mild LVH,  ef 65%  no evidence MVP normal MV   Menorrhagia with irregular cycle    Pre-diabetes 04/08/2022   level 5.8 wtih PCP    Patient Active Problem List   Diagnosis Date Noted   Genetic testing 12/07/2023   Seasonal allergies 11/06/2022   Viral illness 11/06/2022   Elevated blood pressure reading with diagnosis of hypertension 11/06/2022   Routine general medical examination at a health care facility 11/24/2019   IUD check up 11/02/2015   Prediabetes 11/02/2015   Mitral valve prolapse 12/18/2011    Past Surgical History:  Procedure Laterality Date   CESAREAN SECTION  10/17/2001   @[facility]   by dr v. raeanne   CESAREAN SECTION WITH BILATERAL TUBAL LIGATION  03/26/2003   @[facility]  by dr n. dillard    COLONOSCOPY WITH PROPOFOL   09/29/2023   dr albertus   CYSTOSCOPY N/A 01/18/2024   Procedure: CYSTOSCOPY;  Surgeon: Glennon Almarie POUR, MD;  Location: [facility];  Service: Gynecology;  Laterality: N/A;   HYSTERECTOMY, TOTAL, LAPAROSCOPIC, ROBOT-ASSISTED WITH SALPINGECTOMY Bilateral 01/18/2024   Procedure: HYSTERECTOMY, TOTAL, LAPAROSCOPIC, ROBOT-ASSISTED WITH SALPINGECTOMY;  Surgeon: Glennon Almarie POUR, MD;  Location: [facility] OR;  Service: Gynecology;  Laterality: Bilateral;    OB History     Gravida  2   Para  2   Term  2   Preterm      AB      Living  2      SAB      IAB      Ectopic      Multiple      Live Births  2            Home Medications    Prior to Admission medications  Medication Sig Start Date End Date Taking? Authorizing Provider  predniSONE  (DELTASONE ) 20 MG tablet Take 2 tablets (40 mg total) by mouth daily with breakfast for 5 days. 08/19/24 08/24/24 Yes StanhopeDorna HERO, FNP  [employer] IRON 325 (65 Fe) MG tablet Take 325 mg by mouth daily. Patient not taking: Reported on 02/25/2024 02/18/22   [provider]  Cyanocobalamin (VITAMIN B-12 PO) Take  1 each by mouth daily.    [provider]  folic acid (FOLVITE) 400 MCG tablet Take 400 mcg by mouth daily.    [provider]  valsartan (DIOVAN) 80 MG tablet Take 80 mg by mouth daily.    [provider]    Family History Family History  Problem Relation Age of Onset   Cirrhosis Mother    Cancer Father        prostate   Breast cancer Sister 69   Cancer Maternal Aunt 20       lung   Diabetes Maternal Aunt    Cancer Maternal Aunt 64       cervical, unknown   Diabetes Maternal Aunt    Diabetes Maternal Uncle    Diabetes Maternal Grandmother    Diabetes Maternal Grandfather    Diabetes Cousin    Diabetes Other    Hypertension Other    Colon cancer Neg Hx    Colon polyps Neg Hx    Esophageal cancer Neg Hx    Rectal cancer Neg Hx    Stomach cancer Neg Hx      Social History Social History[1]   Allergies   Diclofenac , Meloxicam , Codeine, Oxycontin [oxycodone hcl], and Hydrocodone   Review of Systems Review of Systems Per HPI  Physical Exam Triage Vital Signs ED Triage Vitals  Encounter Vitals Group     BP 08/19/24 1806 106/71     Girls Systolic BP Percentile --      Girls Diastolic BP Percentile --      Boys Systolic BP Percentile --      Boys Diastolic BP Percentile --      Pulse Rate 08/19/24 1806 79     Resp 08/19/24 1806 17     Temp 08/19/24 1806 99 F (37.2 C)     Temp src --      SpO2 08/19/24 1806 99 %     Weight --      Height --      Head Circumference --      Peak Flow --      Pain Score 08/19/24 1805 8     Pain Loc --      Pain Education --      Exclude from Growth Chart --    No data found.  Updated Vital Signs BP 106/71 (BP Location: Left Arm)   Pulse 79   Temp 99 F (37.2 C)   Resp 17   LMP 12/23/2023   SpO2 99%   Visual Acuity Right Eye Distance:   Left Eye Distance:   Bilateral Distance:    Right Eye Near:   Left Eye Near:    Bilateral Near:     Physical Exam Vitals and nursing note reviewed.  Constitutional:      Appearance: She is not ill-appearing or toxic-appearing.  HENT:     Head: Normocephalic and atraumatic.     Right Ear: Hearing and external ear normal.     Left Ear: Hearing and external ear normal.     Nose: Nose normal.     Mouth/Throat:     Lips: Pink.  Eyes:     General: Lids are normal. Vision grossly intact. Gaze aligned appropriately.     Extraocular Movements: Extraocular movements intact.     Conjunctiva/sclera: Conjunctivae normal.  Pulmonary:     Effort: Pulmonary effort is normal.  Musculoskeletal:     Right elbow: Tenderness present in lateral epicondyle.     Left elbow:  Normal.     Right forearm: Normal.     Left forearm: Normal.     Right wrist: Normal.     Left wrist: Normal.     Right hand: Normal.     Left hand: Normal.     Cervical back:  Neck supple.     Comments: Tender to palpation over the lateral epicondyle which elicits radicular pain down the forearm and into the right hand.  4/5 grip strength of right hand, 5/5 grip strength of left hand.  Sensation intact to distal right upper extremity.  Normal range of motion of right elbow and right wrist.  Negative Phalens.   Skin:    General: Skin is warm and dry.     Capillary Refill: Capillary refill takes less than 2 seconds.     Findings: No rash.  Neurological:     General: No focal deficit present.     Mental Status: She is alert and oriented to person, place, and time. Mental status is at baseline.     Cranial Nerves: No dysarthria or facial asymmetry.  Psychiatric:        Mood and Affect: Mood normal.        Speech: Speech normal.        Behavior: Behavior normal.        Thought Content: Thought content normal.        Judgment: Judgment normal.      UC Treatments / Results  Labs (all labs ordered are listed, but only abnormal results are displayed) Labs Reviewed - No data to display  EKG   Radiology No results found.  Procedures Procedures (including critical care time)  Medications Ordered in UC Medications - No data to display  Initial Impression / Assessment and Plan / UC Course  I have reviewed the triage vital signs and the nursing notes.  Pertinent labs & imaging results that were available during my care of the patient were reviewed by me and considered in my medical decision making (see chart for details).   1.  Lateral epicondylitis of right elbow Presentation is consistent with right tennis elbow secondary to overuse inflammation. Symptoms/pain have not responded well to use of NSAIDs and Tylenol , therefore we will attempt use of a prednisone  burst to reduce inflammation. Deferred imaging based on stable musculoskeletal exam and atraumatic mechanism of injury. Tennis elbow brace applied in clinic, advised to use this daily especially while  she is working for support and compression. RICE advised. Follow-up with orthopedics should symptoms fail to improve with interventions provided in clinic today. Work note given.  Counseled patient on potential for adverse effects with medications prescribed/recommended today, strict ER and return-to-clinic precautions discussed, patient verbalized understanding.    Final Clinical Impressions(s) / UC Diagnoses   Final diagnoses:  Lateral epicondylitis of right elbow     Discharge Instructions      You have tennis elbow. Please take prednisone  2 pills once daily for the next 5 days.  Take with food to avoid stomach upset. Do not take any NSAID containing medications when taking prednisone  as this can cause stomach upset. You may continue taking Tylenol  1000 mg every 6 hours as needed for pain and swelling.  Please apply ice to the right elbow 20 minutes on 20 minutes off as needed for pain and inflammation.  Avoid activities that worsen pain including work. I have given you a work note and it is at the back of the packet.  You may follow-up with  [facility] if symptoms fail to improve with the above interventions.     ED Prescriptions     Medication Sig Dispense Auth. Provider   predniSONE  (DELTASONE ) 20 MG tablet Take 2 tablets (40 mg total) by mouth daily with breakfast for 5 days. 10 tablet Enedelia Dorna HERO, FNP      PDMP not reviewed this encounter.    [1]  Social History Tobacco Use   Smoking status: Never   Smokeless tobacco: Never  Vaping Use   Vaping status: Never Used  Substance Use Topics   Alcohol use: No   Drug use: Never     Enedelia Dorna HERO, FNP 08/19/24 1903  "

## 2024-08-19 NOTE — ED Triage Notes (Signed)
 Pt reports burning at right elbow, reports having hard time grabbing with that had and affecting middle finger for a couple weeks. Denies any known injury. 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 Tried tylenol  and ice.

## 2024-08-19 NOTE — Discharge Instructions (Addendum)
 You have tennis elbow. Please take prednisone  2 pills once daily for the next 5 days.  Take with food to avoid stomach upset. Do not take any NSAID containing medications when taking prednisone  as this can cause stomach upset. You may continue taking Tylenol  1000 mg every 6 hours as needed for pain and swelling.  Please apply ice to the right elbow 20 minutes on 20 minutes off as needed for pain and inflammation.  Avoid activities that worsen pain including work. I have given you a work note and it is at the back of the packet.  You may follow-up with [facility] if symptoms fail to improve with the above interventions.

## 2024-09-20 ENCOUNTER — Ambulatory Visit: Admitting: Family Medicine
